# Patient Record
Sex: Female | Born: 1985
Health system: Southern US, Community
[De-identification: ages and names within clinical notes are randomized; demographics above are authoritative.]

## PROBLEM LIST (undated history)

## (undated) ENCOUNTER — Inpatient Hospital Stay (HOSPITAL_COMMUNITY): Payer: Self-pay

## (undated) ENCOUNTER — Ambulatory Visit (HOSPITAL_COMMUNITY): Admission: EM | Payer: Medicaid Other | Source: Home / Self Care

## (undated) DIAGNOSIS — D649 Anemia, unspecified: Secondary | ICD-10-CM

## (undated) DIAGNOSIS — J339 Nasal polyp, unspecified: Secondary | ICD-10-CM

## (undated) DIAGNOSIS — T8859XA Other complications of anesthesia, initial encounter: Secondary | ICD-10-CM

## (undated) DIAGNOSIS — O24419 Gestational diabetes mellitus in pregnancy, unspecified control: Secondary | ICD-10-CM

## (undated) DIAGNOSIS — N90810 Female genital mutilation status, unspecified: Secondary | ICD-10-CM

## (undated) DIAGNOSIS — N841 Polyp of cervix uteri: Secondary | ICD-10-CM

## (undated) HISTORY — DX: Other complications of anesthesia, initial encounter: T88.59XA

---

## 2016-04-29 ENCOUNTER — Encounter: Payer: Self-pay | Admitting: Physician Assistant

## 2016-04-29 ENCOUNTER — Ambulatory Visit: Payer: Medicaid Other | Attending: Physician Assistant | Admitting: Physician Assistant

## 2016-04-29 VITALS — BP 102/72 | HR 84 | Temp 98.6°F | Ht 60.0 in | Wt 110.0 lb

## 2016-04-29 DIAGNOSIS — H6123 Impacted cerumen, bilateral: Secondary | ICD-10-CM

## 2016-04-29 DIAGNOSIS — R05 Cough: Secondary | ICD-10-CM | POA: Diagnosis not present

## 2016-04-29 DIAGNOSIS — R059 Cough, unspecified: Secondary | ICD-10-CM

## 2016-04-29 DIAGNOSIS — Z3201 Encounter for pregnancy test, result positive: Secondary | ICD-10-CM | POA: Insufficient documentation

## 2016-04-29 DIAGNOSIS — J029 Acute pharyngitis, unspecified: Secondary | ICD-10-CM | POA: Diagnosis not present

## 2016-04-29 DIAGNOSIS — Z3490 Encounter for supervision of normal pregnancy, unspecified, unspecified trimester: Secondary | ICD-10-CM

## 2016-04-29 DIAGNOSIS — Z79899 Other long term (current) drug therapy: Secondary | ICD-10-CM | POA: Diagnosis not present

## 2016-04-29 LAB — POCT URINE PREGNANCY: Preg Test, Ur: POSITIVE — AB

## 2016-04-29 MED ORDER — SALINE SPRAY 0.65 % NA SOLN: 1.0000 | NASAL | 0 refills | Status: DC | PRN

## 2016-04-29 MED ORDER — PHENYLEPHRINE HCL 1 % NA SOLN: 1.0000 [drp] | Freq: Four times a day (QID) | NASAL | 0 refills | Status: DC | PRN

## 2016-04-29 MED ORDER — BENZONATATE 100 MG PO CAPS: 100.0000 mg | ORAL_CAPSULE | Freq: Two times a day (BID) | ORAL | 0 refills | Status: DC | PRN

## 2016-04-29 MED ORDER — AZITHROMYCIN 250 MG PO TABS
ORAL_TABLET | ORAL | 0 refills | Status: DC
Start: 2016-04-29 — End: 2016-04-30

## 2016-04-29 MED ORDER — CARBAMIDE PEROXIDE 6.5 % OT SOLN: 5.0000 [drp] | Freq: Two times a day (BID) | OTIC | 0 refills | Status: DC

## 2016-04-29 NOTE — Patient Instructions (Addendum)
You likely have a viral illness but the chronicity of your symptoms is concerning and I would like for you to take an antibiotic for bacterial coverage. Please call us at [phone] if you have persistent symptoms or worsening symptoms.    Upper Respiratory Infection, Adult Most upper respiratory infections (URIs) are a viral infection of the air passages leading to the lungs. A URI affects the nose, throat, and upper air passages. The most common type of URI is nasopharyngitis and is typically referred to as "the common cold." URIs run their course and usually go away on their own. Most of the time, a URI does not require medical attention, but sometimes a bacterial infection in the upper airways can follow a viral infection. This is called a secondary infection. Sinus and middle ear infections are common types of secondary upper respiratory infections. Bacterial pneumonia can also complicate a URI. A URI can worsen asthma and chronic obstructive pulmonary disease (COPD). Sometimes, these complications can require emergency medical care and may be life threatening. What are the causes? Almost all URIs are caused by viruses. A virus is a type of germ and can spread from one person to another. What increases the risk? You may be at risk for a URI if:  You smoke.  You have chronic heart or lung disease.  You have a weakened defense (immune) system.  You are very young or very old.  You have nasal allergies or asthma.  You work in crowded or poorly ventilated areas.  You work in health care facilities or schools. What are the signs or symptoms? Symptoms typically develop 2-3 days after you come in contact with a cold virus. Most viral URIs last 7-10 days. However, viral URIs from the influenza virus (flu virus) can last 14-18 days and are typically more severe. Symptoms may include:  Runny or stuffy (congested) nose.  Sneezing.  Cough.  Sore  throat.  Headache.  Fatigue.  Fever.  Loss of appetite.  Pain in your forehead, behind your eyes, and over your cheekbones (sinus pain).  Muscle aches. How is this diagnosed? Your health care provider may diagnose a URI by:  Physical exam.  Tests to check that your symptoms are not due to another condition such as:  Strep throat.  Sinusitis.  Pneumonia.  Asthma. How is this treated? A URI goes away on its own with time. It cannot be cured with medicines, but medicines may be prescribed or recommended to relieve symptoms. Medicines may help:  Reduce your fever.  Reduce your cough.  Relieve nasal congestion. Follow these instructions at home:  Take medicines only as directed by your health care provider.  Gargle warm saltwater or take cough drops to comfort your throat as directed by your health care provider.  Use a warm mist humidifier or inhale steam from a shower to increase air moisture. This may make it easier to breathe.  Drink enough fluid to keep your urine clear or pale yellow.  Eat soups and other clear broths and maintain good nutrition.  Rest as needed.  Return to work when your temperature has returned to normal or as your health care provider advises. You may need to stay home longer to avoid infecting others. You can also use a face mask and careful hand washing to prevent spread of the virus.  Increase the usage of your inhaler if you have asthma.  Do not use any tobacco products, including cigarettes, chewing tobacco, or electronic cigarettes. If you need help  quitting, ask your health care provider. How is this prevented? The best way to protect yourself from getting a cold is to practice good hygiene.  Avoid oral or hand contact with people with cold symptoms.  Wash your hands often if contact occurs. There is no clear evidence that vitamin C, vitamin E, echinacea, or exercise reduces the chance of developing a cold. However, it is always  recommended to get plenty of rest, exercise, and practice good nutrition. Contact a health care provider if:  You are getting worse rather than better.  Your symptoms are not controlled by medicine.  You have chills.  You have worsening shortness of breath.  You have brown or red mucus.  You have yellow or brown nasal discharge.  You have pain in your face, especially when you bend forward.  You have a fever.  You have swollen neck glands.  You have pain while swallowing.  You have white areas in the back of your throat. Get help right away if:  You have severe or persistent:  Headache.  Ear pain.  Sinus pain.  Chest pain.  You have chronic lung disease and any of the following:  Wheezing.  Prolonged cough.  Coughing up blood.  A change in your usual mucus.  You have a stiff neck.  You have changes in your:  Vision.  Hearing.  Thinking.  Mood. This information is not intended to replace advice given to you by your health care provider. Make sure you discuss any questions you have with your health care provider. Document Released: 08/18/2000 Document Revised: 10/26/2015 Document Reviewed: 05/30/2013 Elsevier Interactive Patient Education  2017 [employer].

## 2016-04-29 NOTE — Progress Notes (Signed)
   Subjective:  Patient ID: Kelsey Ramirez, female    DOB: 04/10/1985  Age: 31 y.o. MRN: [id number]  CC:  Chief Complaint  Patient presents with  . Cough    HPI Kelsey Ramirez is a 31 y.o. female with no significant medical history that presents with 8 days of sore throat, mild left-sided ear pain, general malaise, nasal congestion, and cough. Husband translates for her. [place] and blames acclimates for her symptoms. Has not taken anything for relief. No close contacts with the same, although, husband states that he is beginning to feel a "tickle" in his throat to. Patient does not endorse any other symptoms. Does not believe she is pregnant.    ROS Review of Systems  Constitutional: Positive for malaise/fatigue. Negative for chills and fever.  HENT: Positive for congestion, ear pain (Left side) and sore throat. Negative for sinus pain.   Eyes: Negative for blurred vision, pain and redness.  Respiratory: Positive for cough. Negative for shortness of breath.   Cardiovascular: Negative for chest pain and palpitations.  Gastrointestinal: Negative for abdominal pain and nausea.  Genitourinary: Negative for dysuria and hematuria.  Musculoskeletal: Negative for joint pain and myalgias.  Skin: Negative for rash.  Neurological: Negative for tingling and headaches.  Psychiatric/Behavioral: Negative for depression. The patient is not nervous/anxious.     Objective:  BP 102/72 (BP Location: Left Arm, Patient Position: Sitting, Cuff Size: Normal)   Pulse 84   Temp 98.6 F (37 C) (Oral)   Ht 5' (1.524 m)   Wt 110 lb (49.9 kg)   SpO2 98%   BMI 21.48 kg/m   BP/Weight 04/29/2016  Systolic BP 102  Diastolic BP 72  Wt. (Lbs) 110  BMI 21.48      Physical Exam  Constitutional: She is oriented to person, place, and time.  Mildly ill, NAD, polite  HENT:  Head: Normocephalic and atraumatic.  TMs not visualized due to cerumen impaction. turbinates  erythematous and hypertrophic, oropharynx with mild postnasal drip and no exudates.   Eyes: Conjunctivae are normal. No scleral icterus.  Neck: Normal range of motion. Neck supple. No thyromegaly present.  Cardiovascular: Normal rate, regular rhythm and normal heart sounds.   Pulmonary/Chest: Effort normal. She has no wheezes. She has no rales.  Abdominal: Soft. Bowel sounds are normal.  Musculoskeletal: She exhibits no edema.  Lymphadenopathy:    She has cervical adenopathy (mild anterior cervical lymphadenopathy).  Neurological: She is alert and oriented to person, place, and time.  Skin: Skin is warm and dry. No rash noted.  Psychiatric: Her behavior is normal.     Assessment & Plan:   1. Sore throat - POCT Rapid Strep A Negative - Upper Respiratory Culture  2. Cough Likely viral but is at 8 days of illness with no relief. Pt not toxic or febrile but will treat with abx as a precaution. -Zpack  3. Bilateral impacted cerumen -advised to avoid use of q-tips into the ear canal. -Use Debrox as directed  4. High risk medication use -Possible safety concerns with use of prescribed medications during pregnancy. - POCT urine pregnancy Positive  5. Pregnancy - Positive urine HCG -referral to OB/GYN   Follow-up: Follow up as needed.  Loletta Specteroger David Malina Geers PA

## 2016-04-30 LAB — POCT RAPID STREP A (OFFICE): Rapid Strep A Screen: NEGATIVE

## 2016-04-30 MED ORDER — CARBAMIDE PEROXIDE 6.5 % OT SOLN: 5.0000 [drp] | Freq: Two times a day (BID) | OTIC | 0 refills | Status: DC

## 2016-04-30 MED ORDER — AZITHROMYCIN 250 MG PO TABS: ORAL_TABLET | ORAL | 0 refills | Status: AC

## 2016-04-30 MED FILL — EARWAX TREATMENT 6.5% DROPS: 6.5 | 30 days supply | Qty: 15 | Fill #0

## 2016-04-30 MED FILL — AZITHROMYCIN 250 MG TABLET: 250 | 5 days supply | Qty: 6 | Fill #0

## 2016-05-02 LAB — CULTURE, UPPER RESPIRATORY: ORGANISM ID, BACTERIA: NORMAL

## 2016-05-05 ENCOUNTER — Encounter: Payer: Self-pay | Admitting: Advanced Practice Midwife

## 2016-05-06 ENCOUNTER — Encounter: Payer: Self-pay | Admitting: *Deleted

## 2016-05-06 NOTE — Progress Notes (Signed)
2

## 2016-05-25 ENCOUNTER — Ambulatory Visit (INDEPENDENT_AMBULATORY_CARE_PROVIDER_SITE_OTHER): Payer: Medicaid Other | Admitting: Advanced Practice Midwife

## 2016-05-25 ENCOUNTER — Encounter: Payer: Self-pay | Admitting: Advanced Practice Midwife

## 2016-05-25 ENCOUNTER — Ambulatory Visit: Payer: Self-pay

## 2016-05-25 ENCOUNTER — Other Ambulatory Visit (HOSPITAL_COMMUNITY)
Admission: RE | Admit: 2016-05-25 | Discharge: 2016-05-25 | Disposition: A | Discharge status: Home / Self Care | Payer: Medicaid Other | Source: Ambulatory Visit | Attending: Advanced Practice Midwife | Admitting: Advanced Practice Midwife

## 2016-05-25 VITALS — BP 95/66 | HR 85 | Wt 110.8 lb

## 2016-05-25 DIAGNOSIS — Z113 Encounter for screening for infections with a predominantly sexual mode of transmission: Secondary | ICD-10-CM | POA: Diagnosis not present

## 2016-05-25 DIAGNOSIS — Z3480 Encounter for supervision of other normal pregnancy, unspecified trimester: Secondary | ICD-10-CM | POA: Diagnosis present

## 2016-05-25 DIAGNOSIS — O3680X0 Pregnancy with inconclusive fetal viability, not applicable or unspecified: Secondary | ICD-10-CM

## 2016-05-25 DIAGNOSIS — Z3689 Encounter for other specified antenatal screening: Secondary | ICD-10-CM | POA: Diagnosis not present

## 2016-05-25 DIAGNOSIS — Z3481 Encounter for supervision of other normal pregnancy, first trimester: Secondary | ICD-10-CM | POA: Diagnosis present

## 2016-05-25 DIAGNOSIS — Z124 Encounter for screening for malignant neoplasm of cervix: Secondary | ICD-10-CM

## 2016-05-25 DIAGNOSIS — O34219 Maternal care for unspecified type scar from previous cesarean delivery: Secondary | ICD-10-CM | POA: Diagnosis not present

## 2016-05-25 DIAGNOSIS — Z3490 Encounter for supervision of normal pregnancy, unspecified, unspecified trimester: Secondary | ICD-10-CM | POA: Insufficient documentation

## 2016-05-25 LAB — POCT URINALYSIS DIP (DEVICE)
BILIRUBIN URINE: NEGATIVE
Glucose, UA: NEGATIVE mg/dL
Hgb urine dipstick: NEGATIVE
KETONES UR: NEGATIVE mg/dL
Leukocytes, UA: NEGATIVE
Nitrite: NEGATIVE
Protein, ur: 30 mg/dL — AB
Specific Gravity, Urine: 1.025 (ref 1.005–1.030)
Urobilinogen, UA: 0.2 mg/dL (ref 0.0–1.0)
pH: 6 (ref 5.0–8.0)

## 2016-05-25 LAB — POCT PREGNANCY, URINE: PREG TEST UR: POSITIVE — AB

## 2016-05-25 NOTE — Progress Notes (Signed)
  Subjective:    Kelsey Ramirez is being seen today for her first obstetrical visit. She is married. She is at 2614w6d gestation by LMP. Her obstetrical history is significant for previous C/S (Elective), female circumcision. Patient does intend to breast feed. Pregnancy history fully reviewed.  Patient reports no complaints.  Hospital Interpreter used.  Review of Systems:   Review of Systems  Gastrointestinal: Negative for abdominal pain.  Genitourinary: Negative for vaginal bleeding and vaginal discharge.    Objective:     BP 95/66   Pulse 85   Wt 110 lb 12.8 oz (50.3 kg)   LMP 03/31/2016   BMI 21.64 kg/m  Physical Exam  Nursing note and vitals reviewed. Constitutional: She is oriented to person, place, and time. No distress.  Eyes: No scleral icterus.  Cardiovascular: Normal rate and regular rhythm.   Respiratory: Effort normal and breath sounds normal. No respiratory distress.  GI: Soft. There is no tenderness.  Genitourinary:  Genitourinary Comments: Refused  Musculoskeletal: Normal range of motion.  Neurological: She is alert and oriented to person, place, and time. She has normal reflexes.  Skin: Skin is warm and dry.  Psychiatric: She has a normal mood and affect.    Exam    Assessment:    Pregnancy: G2P1 1. Encounter to determine fetal viability of pregnancy, single or unspecified fetus  - US OB Limited; Future  2. Supervision of other normal pregnancy, antepartum  - US MFM Fetal Nuchal Translucency; Future - Obstetric Panel, Including HIV - Hemoglobinopathy Evaluation - Cervicovaginal ancillary only - Culture, OB Urine  3. Previous cesarean delivery, antepartum    Plan:     Initial labs drawn. Refused Pap or any pelvic exam. Discussed that pelvic exams may be necessary. Offered self collection of specimens when reasonable. Prenatal vitamins. Problem list reviewed and updated. AFP3 discussed: ordered. Role of ultrasound in pregnancy discussed;  fetal survey: requested. Amniocentesis discussed: not indicated. Follow up in 4 weeks. Undecided btw VBAC or repeat c/S. Discussed R/B/I of each.    Dorathy KinsmanVirginia Averey Trompeter 05/25/2016

## 2016-05-25 NOTE — Patient Instructions (Signed)
First Trimester of Pregnancy The first trimester of pregnancy is from week 1 until the end of week 13 (months 1 through 3). A week after a sperm fertilizes an egg, the egg will implant on the wall of the uterus. This embryo will begin to develop into a baby. Genes from you and your partner will form the baby. The female genes will determine whether the baby will be a boy or a girl. At 6-8 weeks, the eyes and face will be formed, and the heartbeat can be seen on ultrasound. At the end of 12 weeks, all the baby's organs will be formed. Now that you are pregnant, you will want to do everything you can to have a healthy baby. Two of the most important things are to get good prenatal care and to follow your health care provider's instructions. Prenatal care is all the medical care you receive before the baby's birth. This care will help prevent, find, and treat any problems during the pregnancy and childbirth. Body changes during your first trimester Your body goes through many changes during pregnancy. The changes vary from woman to woman.  You may gain or lose a couple of pounds at first.  You may feel sick to your stomach (nauseous) and you may throw up (vomit). If the vomiting is uncontrollable, call your health care provider.  You may tire easily.  You may develop headaches that can be relieved by medicines. All medicines should be approved by your health care provider.  You may urinate more often. Painful urination may mean you have a bladder infection.  You may develop heartburn as a result of your pregnancy.  You may develop constipation because certain hormones are causing the muscles that push stool through your intestines to slow down.  You may develop hemorrhoids or swollen veins (varicose veins).  Your breasts may begin to grow larger and become tender. Your nipples may stick out more, and the tissue that surrounds them (areola) may become darker.  Your gums may bleed and may be  sensitive to brushing and flossing.  Dark spots or blotches (chloasma, mask of pregnancy) may develop on your face. This will likely fade after the baby is born.  Your menstrual periods will stop.  You may have a loss of appetite.  You may develop cravings for certain kinds of food.  You may have changes in your emotions from day to day, such as being excited to be pregnant or being concerned that something may go wrong with the pregnancy and baby.  You may have more vivid and strange dreams.  You may have changes in your hair. These can include thickening of your hair, rapid growth, and changes in texture. Some women also have hair loss during or after pregnancy, or hair that feels dry or thin. Your hair will most likely return to normal after your baby is born.  What to expect at prenatal visits During a routine prenatal visit:  You will be weighed to make sure you and the baby are growing normally.  Your blood pressure will be taken.  Your abdomen will be measured to track your baby's growth.  The fetal heartbeat will be listened to between weeks 10 and 14 of your pregnancy.  Test results from any previous visits will be discussed.  Your health care provider may ask you:  How you are feeling.  If you are feeling the baby move.  If you have had any abnormal symptoms, such as leaking fluid, bleeding, severe headaches,   or abdominal cramping.  If you are using any tobacco products, including cigarettes, chewing tobacco, and electronic cigarettes.  If you have any questions.  Other tests that may be performed during your first trimester include:  Blood tests to find your blood type and to check for the presence of any previous infections. The tests will also be used to check for low iron levels (anemia) and protein on red blood cells (Rh antibodies). Depending on your risk factors, or if you previously had diabetes during pregnancy, you may have tests to check for high blood  sugar that affects pregnant women (gestational diabetes).  Urine tests to check for infections, diabetes, or protein in the urine.  An ultrasound to confirm the proper growth and development of the baby.  Fetal screens for spinal cord problems (spina bifida) and Down syndrome.  HIV (human immunodeficiency virus) testing. Routine prenatal testing includes screening for HIV, unless you choose not to have this test.  You may need other tests to make sure you and the baby are doing well.  Follow these instructions at home: Medicines  Follow your health care provider's instructions regarding medicine use. Specific medicines may be either safe or unsafe to take during pregnancy.  Take a prenatal vitamin that contains at least 600 micrograms (mcg) of folic acid.  If you develop constipation, try taking a stool softener if your health care provider approves. Eating and drinking  Eat a balanced diet that includes fresh fruits and vegetables, whole grains, good sources of protein such as meat, eggs, or tofu, and low-fat dairy. Your health care provider will help you determine the amount of weight gain that is right for you.  Avoid raw meat and uncooked cheese. These carry germs that can cause birth defects in the baby.  Eating four or five small meals rather than three large meals a day may help relieve nausea and vomiting. If you start to feel nauseous, eating a few soda crackers can be helpful. Drinking liquids between meals, instead of during meals, also seems to help ease nausea and vomiting.  Limit foods that are high in fat and processed sugars, such as fried and sweet foods.  To prevent constipation: ? Eat foods that are high in fiber, such as fresh fruits and vegetables, whole grains, and beans. ? Drink enough fluid to keep your urine clear or pale yellow. Activity  Exercise only as directed by your health care provider. Most women can continue their usual exercise routine during  pregnancy. Try to exercise for 30 minutes at least 5 days a week. Exercising will help you: ? Control your weight. ? Stay in shape. ? Be prepared for labor and delivery.  Experiencing pain or cramping in the lower abdomen or lower back is a good sign that you should stop exercising. Check with your health care provider before continuing with normal exercises.  Try to avoid standing for long periods of time. Move your legs often if you must stand in one place for a long time.  Avoid heavy lifting.  Wear low-heeled shoes and practice good posture.  You may continue to have sex unless your health care provider tells you not to. Relieving pain and discomfort  Wear a good support bra to relieve breast tenderness.  Take warm sitz baths to soothe any pain or discomfort caused by hemorrhoids. Use hemorrhoid cream if your health care provider approves.  Rest with your legs elevated if you have leg cramps or low back pain.  If you develop   varicose veins in your legs, wear support hose. Elevate your feet for 15 minutes, 3-4 times a day. Limit salt in your diet. Prenatal care  Schedule your prenatal visits by the twelfth week of pregnancy. They are usually scheduled monthly at first, then more often in the last 2 months before delivery.  Write down your questions. Take them to your prenatal visits.  Keep all your prenatal visits as told by your health care provider. This is important. Safety  Wear your seat belt at all times when driving.  Make a list of emergency phone numbers, including numbers for family, friends, the hospital, and police and fire departments. General instructions  Ask your health care provider for a referral to a local prenatal education class. Begin classes no later than the beginning of month 6 of your pregnancy.  Ask for help if you have counseling or nutritional needs during pregnancy. Your health care provider can offer advice or refer you to specialists for help  with various needs.  Do not use hot tubs, steam rooms, or saunas.  Do not douche or use tampons or scented sanitary pads.  Do not cross your legs for long periods of time.  Avoid cat litter boxes and soil used by cats. These carry germs that can cause birth defects in the baby and possibly loss of the fetus by miscarriage or stillbirth.  Avoid all smoking, herbs, alcohol, and medicines not prescribed by your health care provider. Chemicals in these products affect the formation and growth of the baby.  Do not use any products that contain nicotine or tobacco, such as cigarettes and e-cigarettes. If you need help quitting, ask your health care provider. You may receive counseling support and other resources to help you quit.  Schedule a dentist appointment. At home, brush your teeth with a soft toothbrush and be gentle when you floss. Contact a health care provider if:  You have dizziness.  You have mild pelvic cramps, pelvic pressure, or nagging pain in the abdominal area.  You have persistent nausea, vomiting, or diarrhea.  You have a bad smelling vaginal discharge.  You have pain when you urinate.  You notice increased swelling in your face, hands, legs, or ankles.  You are exposed to fifth disease or chickenpox.  You are exposed to German measles (rubella) and have never had it. Get help right away if:  You have a fever.  You are leaking fluid from your vagina.  You have spotting or bleeding from your vagina.  You have severe abdominal cramping or pain.  You have rapid weight gain or loss.  You vomit blood or material that looks like coffee grounds.  You develop a severe headache.  You have shortness of breath.  You have any kind of trauma, such as from a fall or a car accident. Summary  The first trimester of pregnancy is from week 1 until the end of week 13 (months 1 through 3).  Your body goes through many changes during pregnancy. The changes vary from  woman to woman.  You will have routine prenatal visits. During those visits, your health care provider will examine you, discuss any test results you may have, and talk with you about how you are feeling. This information is not intended to replace advice given to you by your health care provider. Make sure you discuss any questions you have with your health care provider. Document Released: 02/16/2001 Document Revised: 02/04/2016 Document Reviewed: 02/04/2016 Elsevier Interactive Patient Education  2017 [employer]  

## 2016-05-25 NOTE — Progress Notes (Signed)
Interpreter present during US exam.  Pt informed that the ultrasound is considered a limited OB ultrasound and is not intended to be a complete ultrasound exam.  Patient also informed that the ultrasound is not being completed with the intent of assessing for fetal or placental anomalies or any pelvic abnormalities.  Explained that the purpose of today's ultrasound is to assess for viability.  Patient acknowledges the purpose of the exam and the limitations of the study.    Single IUP FHR - 166 bpm per M-mode

## 2016-05-26 LAB — OBSTETRIC PANEL, INCLUDING HIV
ANTIBODY SCREEN: NEGATIVE
BASOS: 0 %
Basophils Absolute: 0 10*3/uL (ref 0.0–0.2)
EOS (ABSOLUTE): 0.6 10*3/uL — ABNORMAL HIGH (ref 0.0–0.4)
EOS: 10 %
HEMATOCRIT: 36 % (ref 34.0–46.6)
HEMOGLOBIN: 11.8 g/dL (ref 11.1–15.9)
HIV SCREEN 4TH GENERATION: NONREACTIVE
Hepatitis B Surface Ag: NEGATIVE
IMMATURE GRANS (ABS): 0 10*3/uL (ref 0.0–0.1)
Immature Granulocytes: 0 %
Lymphocytes Absolute: 2.5 10*3/uL (ref 0.7–3.1)
Lymphs: 40 %
MCH: 27.7 pg (ref 26.6–33.0)
MCHC: 32.8 g/dL (ref 31.5–35.7)
MCV: 85 fL (ref 79–97)
MONOS ABS: 0.5 10*3/uL (ref 0.1–0.9)
Monocytes: 8 %
NEUTROS ABS: 2.6 10*3/uL (ref 1.4–7.0)
Neutrophils: 42 %
Platelets: 366 10*3/uL (ref 150–379)
RBC: 4.26 x10E6/uL (ref 3.77–5.28)
RDW: 15.1 % (ref 12.3–15.4)
RH TYPE: POSITIVE
RPR Ser Ql: NONREACTIVE
RUBELLA: 3.97 {index} (ref >0.99)
WBC: 6.2 10*3/uL (ref 3.4–10.8)

## 2016-05-26 LAB — HEMOGLOBINOPATHY EVALUATION
FERRITIN: 181 ng/mL — AB (ref 15–150)
HGB A2 QUANT: 2.5 % (ref 1.8–3.2)
HGB C: 0 %
Hgb A: 97.5 % (ref 96.4–98.8)
Hgb F Quant: 0 % (ref 0.0–2.0)
Hgb S: 0 %
Hgb Solubility: NEGATIVE
Hgb Variant: 0 %

## 2016-05-26 LAB — CERVICOVAGINAL ANCILLARY ONLY
Chlamydia: NEGATIVE
Neisseria Gonorrhea: NEGATIVE

## 2016-05-27 LAB — URINE CULTURE, OB REFLEX

## 2016-05-27 LAB — CULTURE, OB URINE

## 2016-06-23 ENCOUNTER — Ambulatory Visit (INDEPENDENT_AMBULATORY_CARE_PROVIDER_SITE_OTHER): Payer: Medicaid Other | Admitting: Family Medicine

## 2016-06-23 VITALS — BP 97/66 | HR 87 | Wt 108.1 lb

## 2016-06-23 DIAGNOSIS — Z3480 Encounter for supervision of other normal pregnancy, unspecified trimester: Secondary | ICD-10-CM

## 2016-06-23 DIAGNOSIS — N90810 Female genital mutilation status, unspecified: Secondary | ICD-10-CM

## 2016-06-23 DIAGNOSIS — Z789 Other specified health status: Secondary | ICD-10-CM

## 2016-06-23 DIAGNOSIS — O34219 Maternal care for unspecified type scar from previous cesarean delivery: Secondary | ICD-10-CM

## 2016-06-23 NOTE — Progress Notes (Signed)
Used [employer] interpreter [place], [id number]

## 2016-06-23 NOTE — Patient Instructions (Signed)
First Trimester of Pregnancy The first trimester of pregnancy is from week 1 until the end of week 13 (months 1 through 3). During this time, your baby will begin to develop inside you. At 6-8 weeks, the eyes and face are formed, and the heartbeat can be seen on ultrasound. At the end of 12 weeks, all the baby's organs are formed. Prenatal care is all the medical care you receive before the birth of your baby. Make sure you get good prenatal care and follow all of your doctor's instructions. Follow these instructions at home: Medicines  Take over-the-counter and prescription medicines only as told by your doctor. Some medicines are safe and some medicines are not safe during pregnancy.  Take a prenatal vitamin that contains at least 600 micrograms (mcg) of folic acid.  If you have trouble pooping (constipation), take medicine that will make your stool soft (stool softener) if your doctor approves. Eating and drinking  Eat regular, healthy meals.  Your doctor will tell you the amount of weight gain that is right for you.  Avoid raw meat and uncooked cheese.  If you feel sick to your stomach (nauseous) or throw up (vomit): ? Eat 4 or 5 small meals a day instead of 3 large meals. ? Try eating a few soda crackers. ? Drink liquids between meals instead of during meals.  To prevent constipation: ? Eat foods that are high in fiber, like fresh fruits and vegetables, whole grains, and beans. ? Drink enough fluids to keep your pee (urine) clear or pale yellow. Activity  Exercise only as told by your doctor. Stop exercising if you have cramps or pain in your lower belly (abdomen) or low back.  Do not exercise if it is too hot, too humid, or if you are in a place of great height (high altitude).  Try to avoid standing for long periods of time. Move your legs often if you must stand in one place for a long time.  Avoid heavy lifting.  Wear low-heeled shoes. Sit and stand up straight.  You  can have sex unless your doctor tells you not to. Relieving pain and discomfort  Wear a good support bra if your breasts are sore.  Take warm water baths (sitz baths) to soothe pain or discomfort caused by hemorrhoids. Use hemorrhoid cream if your doctor says it is okay.  Rest with your legs raised if you have leg cramps or low back pain.  If you have puffy, bulging veins (varicose veins) in your legs: ? Wear support hose or compression stockings as told by your doctor. ? Raise (elevate) your feet for 15 minutes, 3-4 times a day. ? Limit salt in your food. Prenatal care  Schedule your prenatal visits by the twelfth week of pregnancy.  Write down your questions. Take them to your prenatal visits.  Keep all your prenatal visits as told by your doctor. This is important. Safety  Wear your seat belt at all times when driving.  Make a list of emergency phone numbers. The list should include numbers for family, friends, the hospital, and police and fire departments. General instructions  Ask your doctor for a referral to a local prenatal class. Begin classes no later than at the start of month 6 of your pregnancy.  Ask for help if you need counseling or if you need help with nutrition. Your doctor can give you advice or tell you where to go for help.  Do not use hot tubs, steam rooms, or   saunas.  Do not douche or use tampons or scented sanitary pads.  Do not cross your legs for long periods of time.  Avoid all herbs and alcohol. Avoid drugs that are not approved by your doctor.  Do not use any tobacco products, including cigarettes, chewing tobacco, and electronic cigarettes. If you need help quitting, ask your doctor. You may get counseling or other support to help you quit.  Avoid cat litter boxes and soil used by cats. These carry germs that can cause birth defects in the baby and can cause a loss of your baby (miscarriage) or stillbirth.  Visit your dentist. At home, brush  your teeth with a soft toothbrush. Be gentle when you floss. Contact a doctor if:  You are dizzy.  You have mild cramps or pressure in your lower belly.  You have a nagging pain in your belly area.  You continue to feel sick to your stomach, you throw up, or you have watery poop (diarrhea).  You have a bad smelling fluid coming from your vagina.  You have pain when you pee (urinate).  You have increased puffiness (swelling) in your face, hands, legs, or ankles. Get help right away if:  You have a fever.  You are leaking fluid from your vagina.  You have spotting or bleeding from your vagina.  You have very bad belly cramping or pain.  You gain or lose weight rapidly.  You throw up blood. It may look like coffee grounds.  You are around people who have German measles, fifth disease, or chickenpox.  You have a very bad headache.  You have shortness of breath.  You have any kind of trauma, such as from a fall or a car accident. Summary  The first trimester of pregnancy is from week 1 until the end of week 13 (months 1 through 3).  To take care of yourself and your unborn baby, you will need to eat healthy meals, take medicines only if your doctor tells you to do so, and do activities that are safe for you and your baby.  Keep all follow-up visits as told by your doctor. This is important as your doctor will have to ensure that your baby is healthy and growing well. This information is not intended to replace advice given to you by your health care provider. Make sure you discuss any questions you have with your health care provider. Document Released: 08/11/2007 Document Revised: 03/02/2016 Document Reviewed: 03/02/2016 Elsevier Interactive Patient Education  2017 Elsevier Inc.  

## 2016-06-23 NOTE — Progress Notes (Signed)
      Subjective:  Kelsey Ramirez is a 31 y.o. G2P1001 at [redacted]w[redacted]d being seen today for ongoing prenatal care.  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  Patient reports no complaints.  Contractions: Not present. Vag. Bleeding: None.  Movement: Absent. Denies leaking of fluid.   The following portions of the patient's history were reviewed and updated as appropriate: allergies, current medications, past family history, past medical history, past social history, past surgical history and problem list. Problem list updated.  Objective:   Vitals:   06/23/16 1312  BP: 97/66  Pulse: 87  Weight: 108 lb 1.6 oz (49 kg)    Fetal Status: Fetal Heart Rate (bpm): 165   Movement: Absent      General:  Alert, oriented and cooperative. Patient is in no acute distress.  Skin: Skin is warm and dry. No rash noted.   Cardiovascular: Normal heart rate noted  Respiratory: Normal respiratory effort, no problems with respiration noted  Abdomen: Soft, gravid, appropriate for gestational age. Pain/Pressure: Absent     Pelvic:  Cervical exam deferred        Extremities: Normal range of motion.  Edema: None  Mental Status: Normal mood and affect. Normal behavior. Normal judgment and thought content.   Urinalysis:      Assessment and Plan:  Pregnancy: G2P1001 at [redacted]w[redacted]d  1. Supervision of other normal pregnancy, antepartum  2. Previous cesarean delivery, antepartum - Wants to TOLAC  3. Language barrier affecting health care - Arabic  4. Female circumcision - Very nervous about having any sort of check from below. Discussed with patient what cervical exams would entail and also the speculum. Patient is a little more open to having them done in the future. She is concerned due to her female  circumcision has a very small and tight opening and afraid it will hurt. - She is OK after delivery if female circ is torn, does not expect to repair to previous circ (OK if ends up "normal", and would prefer)   Preterm labor symptoms and general obstetric precautions including but not limited to vaginal bleeding, contractions, leaking of fluid and fetal movement were reviewed in detail with the patient. Please refer to After Visit Summary for other counseling recommendations.  Return in about 4 weeks (around 07/21/2016) for Routine OB visit; female provider only.   Jen Mow, DO OB Fellow [facility]

## 2016-07-02 ENCOUNTER — Ambulatory Visit (HOSPITAL_COMMUNITY)
Admission: RE | Admit: 2016-07-02 | Discharge: 2016-07-02 | Disposition: A | Discharge status: Home / Self Care | Payer: Medicaid Other | Source: Ambulatory Visit | Attending: Advanced Practice Midwife | Admitting: Advanced Practice Midwife

## 2016-07-02 ENCOUNTER — Other Ambulatory Visit: Payer: Self-pay | Admitting: Advanced Practice Midwife

## 2016-07-02 ENCOUNTER — Encounter (HOSPITAL_COMMUNITY): Payer: Self-pay

## 2016-07-02 DIAGNOSIS — Z3480 Encounter for supervision of other normal pregnancy, unspecified trimester: Secondary | ICD-10-CM

## 2016-07-02 DIAGNOSIS — Z3A13 13 weeks gestation of pregnancy: Secondary | ICD-10-CM | POA: Diagnosis not present

## 2016-07-02 DIAGNOSIS — Z3682 Encounter for antenatal screening for nuchal translucency: Secondary | ICD-10-CM | POA: Insufficient documentation

## 2016-07-09 ENCOUNTER — Other Ambulatory Visit: Payer: Self-pay

## 2016-07-21 ENCOUNTER — Ambulatory Visit (INDEPENDENT_AMBULATORY_CARE_PROVIDER_SITE_OTHER): Payer: Medicaid Other | Admitting: Medical

## 2016-07-21 VITALS — BP 96/57 | HR 90 | Wt 108.5 lb

## 2016-07-21 DIAGNOSIS — Z3480 Encounter for supervision of other normal pregnancy, unspecified trimester: Secondary | ICD-10-CM

## 2016-07-21 DIAGNOSIS — Z3482 Encounter for supervision of other normal pregnancy, second trimester: Secondary | ICD-10-CM

## 2016-07-21 NOTE — Progress Notes (Signed)
   PRENATAL VISIT NOTE  Subjective:  Kelsey Ramirez is a 31 y.o. G2P1001 at 4265w0d being seen today for ongoing prenatal care.  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  Patient reports no complaints.  Contractions: Not present. Vag. Bleeding: None.  Movement: Absent. Denies leaking of fluid.   The following portions of the patient's history were reviewed and updated as appropriate: allergies, current medications, past family history, past medical history, past social history, past surgical history and problem list. Problem list updated.  Objective:   Vitals:   07/21/16 1543  BP: (!) 96/57  Pulse: 90  Weight: 108 lb 8 oz (49.2 kg)    Fetal Status: Fetal Heart Rate (bpm): 152   Movement: Absent     General:  Alert, oriented and cooperative. Patient is in no acute distress.  Skin: Skin is warm and dry. No rash noted.   Cardiovascular: Normal heart rate noted  Respiratory: Normal respiratory effort, no problems with respiration noted  Abdomen: Soft, gravid, appropriate for gestational age. Pain/Pressure: Absent     Pelvic:  Cervical exam deferred        Extremities: Normal range of motion.     Mental Status: Normal mood and affect. Normal behavior. Normal judgment and thought content.   Assessment and Plan:  Pregnancy: G2P1001 at 165w0d  1. 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 Please refer to After Visit  Summary for other counseling recommendations.  Return in about 4 weeks (around 08/18/2016) for LOB.   Vonzella NippleJulie Wenzel, PA-C

## 2016-07-21 NOTE — Patient Instructions (Signed)
Second Trimester of Pregnancy The second trimester is from week 13 through week 28, month 4 through 6. This is often the time in pregnancy that you feel your best. Often times, morning sickness has lessened or quit. You may have more energy, and you may get hungry more often. Your unborn baby (fetus) is growing rapidly. At the end of the sixth month, he or she is about 9 inches long and weighs about 1 pounds. You will likely feel the baby move (quickening) between 18 and 20 weeks of pregnancy. Follow these instructions at home:  Avoid all smoking, herbs, and alcohol. Avoid drugs not approved by your doctor.  Do not use any tobacco products, including cigarettes, chewing tobacco, and electronic cigarettes. If you need help quitting, ask your doctor. You may get counseling or other support to help you quit.  Only take medicine as told by your doctor. Some medicines are safe and some are not during pregnancy.  Exercise only as told by your doctor. Stop exercising if you start having cramps.  Eat regular, healthy meals.  Wear a good support bra if your breasts are tender.  Do not use hot tubs, steam rooms, or saunas.  Wear your seat belt when driving.  Avoid raw meat, uncooked cheese, and liter boxes and soil used by cats.  Take your prenatal vitamins.  Take 1500-2000 milligrams of calcium daily starting at the 20th week of pregnancy until you deliver your baby.  Try taking medicine that helps you poop (stool softener) as needed, and if your doctor approves. Eat more fiber by eating fresh fruit, vegetables, and whole grains. Drink enough fluids to keep your pee (urine) clear or pale yellow.  Take warm water baths (sitz baths) to soothe pain or discomfort caused by hemorrhoids. Use hemorrhoid cream if your doctor approves.  If you have puffy, bulging veins (varicose veins), wear support hose. Raise (elevate) your feet for 15 minutes, 3-4 times a day. Limit salt in your diet.  Avoid heavy  lifting, wear low heals, and sit up straight.  Rest with your legs raised if you have leg cramps or low back pain.  Visit your dentist if you have not gone during your pregnancy. Use a soft toothbrush to brush your teeth. Be gentle when you floss.  You can have sex (intercourse) unless your doctor tells you not to.  Go to your doctor visits. Get help if:  You feel dizzy.  You have mild cramps or pressure in your lower belly (abdomen).  You have a nagging pain in your belly area.  You continue to feel sick to your stomach (nauseous), throw up (vomit), or have watery poop (diarrhea).  You have bad smelling fluid coming from your vagina.  You have pain with peeing (urination). Get help right away if:  You have a fever.  You are leaking fluid from your vagina.  You have spotting or bleeding from your vagina.  You have severe belly cramping or pain.  You lose or gain weight rapidly.  You have trouble catching your breath and have chest pain.  You notice sudden or extreme puffiness (swelling) of your face, hands, ankles, feet, or legs.  You have not felt the baby move in over an hour.  You have severe headaches that do not go away with medicine.  You have vision changes. This information is not intended to replace advice given to you by your health care provider. Make sure you discuss any questions you have with your health care   provider. Document Released: 05/19/2009 Document Revised: 07/31/2015 Document Reviewed: 04/25/2012 Elsevier Interactive Patient Education  2017 Elsevier Inc.  

## 2016-07-21 NOTE — Progress Notes (Signed)
Flu shot not given due to allergic to eggs with rash, itching.

## 2016-08-11 ENCOUNTER — Ambulatory Visit (HOSPITAL_COMMUNITY)
Admission: RE | Admit: 2016-08-11 | Discharge: 2016-08-11 | Disposition: A | Discharge status: Home / Self Care | Payer: Medicaid Other | Source: Ambulatory Visit | Attending: Medical | Admitting: Medical

## 2016-08-11 DIAGNOSIS — Z3482 Encounter for supervision of other normal pregnancy, second trimester: Secondary | ICD-10-CM | POA: Insufficient documentation

## 2016-08-11 DIAGNOSIS — Z3480 Encounter for supervision of other normal pregnancy, unspecified trimester: Secondary | ICD-10-CM | POA: Diagnosis present

## 2016-08-11 DIAGNOSIS — Z3A19 19 weeks gestation of pregnancy: Secondary | ICD-10-CM | POA: Insufficient documentation

## 2016-08-18 ENCOUNTER — Ambulatory Visit (INDEPENDENT_AMBULATORY_CARE_PROVIDER_SITE_OTHER): Payer: Medicaid Other | Admitting: Advanced Practice Midwife

## 2016-08-18 DIAGNOSIS — Z3480 Encounter for supervision of other normal pregnancy, unspecified trimester: Secondary | ICD-10-CM

## 2016-08-18 DIAGNOSIS — Z3482 Encounter for supervision of other normal pregnancy, second trimester: Secondary | ICD-10-CM

## 2016-08-18 MED ORDER — PRENATAL VITAMINS 0.8 MG PO TABS: 1.0000 | ORAL_TABLET | Freq: Every day | ORAL | 12 refills | Status: DC

## 2016-08-18 NOTE — Patient Instructions (Signed)

## 2016-08-19 ENCOUNTER — Encounter: Payer: Self-pay | Admitting: Advanced Practice Midwife

## 2016-08-19 NOTE — Progress Notes (Signed)
   PRENATAL VISIT NOTE  Subjective:  Kelsey Ramirez is a 31 y.o. G2P1001 at 5571w1d being seen today for ongoing prenatal care.  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  Patient reports no complaints.  Contractions: Not present.  .  Movement: Present. Denies leaking of fluid.   The following portions of the patient's history were reviewed and updated as appropriate: allergies, current medications, past family history, past medical history, past social history, past surgical history and problem list. Problem list updated.  Objective:   Vitals:   08/18/16 1307  BP: 100/67  Pulse: 76  Weight: 115 lb 3.2 oz (52.3 kg)    Fetal Status: Fetal Heart Rate (bpm): 156   Movement: Present     General:  Alert, oriented and cooperative. Patient is in no acute distress.  Skin: Skin is warm and dry. No rash noted.   Cardiovascular: Normal heart rate noted  Respiratory: Normal respiratory effort, no problems with respiration noted  Abdomen: Soft, gravid, appropriate for gestational age. Pain/Pressure: Absent     Pelvic:  Cervical exam deferred        Extremities: Normal range of motion.     Mental Status: Normal mood and affect. Normal behavior. Normal judgment and thought content.   Assessment and Plan:  Pregnancy: G2P1001 at 1571w1d  1. Supervision of other normal pregnancy, antepartum     Needs AFP only, left before getting it ordered.  WIll call her to come in for lab visit  Preterm labor symptoms and general obstetric precautions including but not limited to vaginal bleeding, contractions, leaking of fluid and fetal movement were reviewed in detail with the patient. Please refer to After Visit Summary for other counseling recommendations.  Return in about 4 weeks (around 09/15/2016)  for [facility].   Wynelle BourgeoisMarie Zurich Carreno, CNM

## 2016-08-23 ENCOUNTER — Telehealth: Payer: Self-pay | Admitting: *Deleted

## 2016-08-23 NOTE — Telephone Encounter (Signed)
Per message from Wynelle BourgeoisMarie Williams, CNM patient had first screen, needs appt for afp only. I called with pacific interperer [phone] and left message we are calling with some information- please call our office.

## 2016-08-25 NOTE — Telephone Encounter (Signed)
I have left a detailed message using arabic interpreter to call us back to scheduled an appointment for afp

## 2016-09-01 ENCOUNTER — Encounter: Payer: Self-pay | Admitting: *Deleted

## 2016-09-01 NOTE — Telephone Encounter (Signed)
[facility] [phone] , left a message we are calling with some information - please call us back . Will send letter. Needs to be drawn by or on 09/08/16.

## 2016-09-02 ENCOUNTER — Inpatient Hospital Stay (HOSPITAL_COMMUNITY)
Admission: AD | Admit: 2016-09-02 | Discharge: 2016-09-02 | Disposition: A | Discharge status: Home / Self Care | Payer: Medicaid Other | Source: Ambulatory Visit | Attending: Obstetrics and Gynecology | Admitting: Obstetrics and Gynecology

## 2016-09-02 DIAGNOSIS — Z3A22 22 weeks gestation of pregnancy: Secondary | ICD-10-CM | POA: Diagnosis not present

## 2016-09-02 DIAGNOSIS — O4692 Antepartum hemorrhage, unspecified, second trimester: Secondary | ICD-10-CM | POA: Diagnosis not present

## 2016-09-02 LAB — URINALYSIS, ROUTINE W REFLEX MICROSCOPIC
Bilirubin Urine: NEGATIVE
GLUCOSE, UA: NEGATIVE mg/dL
Hgb urine dipstick: NEGATIVE
Ketones, ur: NEGATIVE mg/dL
LEUKOCYTES UA: NEGATIVE
Nitrite: NEGATIVE
PROTEIN: NEGATIVE mg/dL
SPECIFIC GRAVITY, URINE: 1.008 (ref 1.005–1.030)
pH: 6 (ref 5.0–8.0)

## 2016-09-02 NOTE — MAU Note (Signed)
[facility] interpreter: #[id number] -   I fell down, I just want to check the heart beat.I did not fall on the floor, I lifted a heavy blanket. This happened around 2200 last night. I only want you to check my baby's heart beat.

## 2016-09-02 NOTE — MAU Provider Note (Signed)
Chief Complaint: Vaginal Bleeding   None     SUBJECTIVE HPI: Kelsey Ramirez is a 31 y.o. G2P1001 at 107w1d by LMP who presents to maternity admissions reporting vaginal spotting starting today, becoming less and less since onset but she came to be evaluated.  She reports she lifted something heavy at home today and then her 32 year old started to fall and she quickly reached for him and nearly fell. The spotting started after these events. She denies any associated symptoms. She declines a pelvic exam today but wants to hear the baby's heartbeat.   She denies abdominal pain, vaginal itching/burning, urinary symptoms, h/a, dizziness, n/v, or fever/chills.     HPI  No past medical history on file. Past Surgical History:  Procedure Laterality Date  . CESAREAN SECTION     Social History   Social History  . Marital status: Married    Spouse name: N/A  . Number of children: N/A  . Years of education: N/A   Occupational History  . Not on file.   Social History Main Topics  . Smoking status: Never Smoker  . Smokeless tobacco: Never Used  . Alcohol use No  . Drug use: No  . Sexual activity: Yes    Birth control/ protection: None   Other Topics Concern  . Not on file   Social History Narrative  . No narrative on file   No current facility-administered medications on file prior to encounter.    Current Outpatient Prescriptions on File Prior to Encounter  Medication Sig Dispense Refill  . folic acid (FOLVITE) 1 MG tablet Take 1 mg by mouth daily.    . Prenatal Multivit-Min-Fe-FA (PRENATAL VITAMINS) 0.8 MG tablet Take 1 tablet by mouth daily. 30 tablet 12  . sodium chloride (OCEAN) 0.65 % SOLN nasal spray Place 1 spray into both nostrils as needed for congestion. 1 Bottle 0   Allergies  Allergen Reactions  . Banana (Diagnostic) Itching  . Eggs Or Egg-Derived Products Itching  . Strawberry (Diagnostic) Itching    ROS:  Review of Systems  Constitutional: Negative for chills,  fatigue and fever.  Respiratory: Negative for shortness of breath.   Cardiovascular: Negative for chest pain.  Gastrointestinal: Negative for nausea and vomiting.  Genitourinary: Positive for vaginal bleeding. Negative for difficulty urinating, dysuria, flank pain, pelvic pain, vaginal discharge and vaginal pain.  Neurological: Negative for dizziness and headaches.  Psychiatric/Behavioral: Negative.      I have reviewed patient's Past Medical Hx, Surgical Hx, Family Hx, Social Hx, medications and allergies.   Physical Exam  Patient Vitals for the past 24 hrs:  BP Temp Temp src Pulse Resp SpO2 Height Weight  09/02/16 0206 102/65 98 F (36.7 C) Oral 92 18 100 % 5' (1.524 m) 118 lb (53.5 kg)   Constitutional: Well-developed, well-nourished female in no acute distress.  Cardiovascular: normal rate Respiratory: normal effort GI: Abd soft, non-tender. Pos BS x 4 MS: Extremities nontender, no edema, normal ROM Neurologic: Alert and oriented x 4.  GU: Neg CVAT.  PELVIC EXAM: Pt declines  FHT 138 by doppler  LAB RESULTS Results for orders placed or performed during the hospital encounter of 09/02/16 (from the past 24 hour(s))  Urinalysis, Routine w reflex microscopic     Status: Abnormal   Collection Time: 09/02/16  1:50 AM  Result Value Ref Range   Color, Urine YELLOW YELLOW   APPearance HAZY (A) CLEAR   Specific Gravity, Urine 1.008 1.005 - 1.030   pH 6.0 5.0 -  8.0   Glucose, UA NEGATIVE NEGATIVE mg/dL   Hgb urine dipstick NEGATIVE NEGATIVE   Bilirubin Urine NEGATIVE NEGATIVE   Ketones, ur NEGATIVE NEGATIVE mg/dL   Protein, ur NEGATIVE NEGATIVE mg/dL   Nitrite NEGATIVE NEGATIVE   Leukocytes, UA NEGATIVE NEGATIVE    O/Positive/-- (03/20 1139)  IMAGING   MAU Management/MDM: Pt declines pelvic exam.  Discussed with pt using Arabic on video interpreter that pelvic exam, at least cervical exam is recommended for bleeding at this gestation. Discussed causes of vaginal  bleeding including cervical shortening or preterm labor as reasons to perform exam. Pt and husband considered the option of having a pelvic exam but decided to decline. The bleeding has improved/mostly resolved and there is no pain so she will return to MAU if bleeding increases or she has pain. Precautions reviewed.  Pt reassured by normal FHT today.  F/U in office as scheduled, return to MAU as needed for emergencies.   Pt stable at time of discharge.  ASSESSMENT 1. Vaginal bleeding in pregnancy, second trimester     PLAN Discharge home with bleeding and preterm labor precautions  Allergies as of 09/02/2016      Reactions   Banana (diagnostic) Itching   Eggs Or Egg-derived Products Itching   Strawberry (diagnostic) Itching      Medication List    TAKE these medications   folic acid 1 MG tablet Commonly known as:  FOLVITE Take 1 mg by mouth daily.   Prenatal Vitamins 0.8 MG tablet Take 1 tablet by mouth daily.   sodium chloride 0.65 % Soln nasal spray Commonly known as:  OCEAN Place 1 spray into both nostrils as needed for congestion.      Follow-up Information    [facility]   Specialty:  Obstetrics and Gynecology Why:  As scheduled, return to MAU as needed for emergencies Contact information: [address] [place] [id number] [phone]          Sharen CounterLisa Leftwich-Kirby Certified Nurse-Midwife 09/02/2016  2:52 AM

## 2016-09-02 NOTE — MAU Note (Signed)
Pt here with c/o vaginal bleeding tonight, light bleeding; ran over to prevent baby from falling off couch, starting having some bleeding after that.

## 2016-09-02 NOTE — MAU Note (Addendum)
Adil [id number] [facility]) [facility].  Pt. Only wants fetal heart beat checked.  FHR via doppler 138 bpm. Pt. Stated she does not want U/S or anything. Provider discussed what procedures are indicated for a complaint of vaginal bleeding. Patient and her spouse declined - spouse shaking his head and saying "NO" vaginal exam. No vaginal exam performed. Pt. & spouse states, they will come back if bleeding starts again.

## 2016-09-10 ENCOUNTER — Other Ambulatory Visit: Payer: Medicaid Other

## 2016-09-10 DIAGNOSIS — Z1379 Encounter for other screening for genetic and chromosomal anomalies: Secondary | ICD-10-CM

## 2016-09-10 DIAGNOSIS — O34219 Maternal care for unspecified type scar from previous cesarean delivery: Secondary | ICD-10-CM

## 2016-09-15 LAB — AFP, SERUM, OPEN SPINA BIFIDA
AFP MoM: 1.61
AFP Value: 140.7 ng/mL
GEST. AGE ON COLLECTION DATE: 23.3 wk
Maternal Age At EDD: 32.8 yr
OSBR Risk 1 IN: 2047
TEST RESULTS AFP: NEGATIVE
Weight: 118 [lb_av]

## 2016-09-16 ENCOUNTER — Ambulatory Visit (INDEPENDENT_AMBULATORY_CARE_PROVIDER_SITE_OTHER): Payer: Medicaid Other | Admitting: Medical

## 2016-09-16 VITALS — BP 100/66 | HR 96 | Wt 118.3 lb

## 2016-09-16 DIAGNOSIS — Z3480 Encounter for supervision of other normal pregnancy, unspecified trimester: Secondary | ICD-10-CM

## 2016-09-16 DIAGNOSIS — Z3482 Encounter for supervision of other normal pregnancy, second trimester: Secondary | ICD-10-CM

## 2016-09-16 NOTE — Progress Notes (Signed)
Video Interpreter # [phone]

## 2016-09-16 NOTE — Progress Notes (Signed)
   PRENATAL VISIT NOTE  Subjective:  Kelsey Ramirez is a 31 y.o. G2P1001 at 6340w1d being seen today for ongoing prenatal care.  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  Patient reports no complaints.  Contractions: Not present. Vag. Bleeding: None.  Movement: Present. Denies leaking of fluid.   The following portions of the patient's history were reviewed and updated as appropriate: allergies, current medications, past family history, past medical history, past social history, past surgical history and problem list. Problem list updated.  Objective:   Vitals:   09/16/16 0756  BP: 100/66  Pulse: 96  Weight: 118 lb 4.8 oz (53.7 kg)    Fetal Status: Fetal Heart Rate (bpm): 165 Fundal Height: 24 cm Movement: Present     General:  Alert, oriented and cooperative. Patient is in no acute distress.  Skin: Skin is warm and dry. No rash noted.   Cardiovascular: Normal heart rate noted  Respiratory: Normal respiratory effort, no problems with respiration noted  Abdomen: Soft, gravid, appropriate for gestational age. Pain/Pressure: Absent     Pelvic:  Cervical exam deferred        Extremities: Normal range of motion.  Edema: None  Mental Status: Normal mood and affect. Normal behavior. Normal judgment and thought content.   Assessment and Plan:  Pregnancy: G2P1001 at 8040w1d  1. Supervision of other normal pregnancy, antepartum - Doing well [employer] letter supplied today  - Discussed need for fasting labs at the next visit  Preterm labor symptoms and general obstetric precautions including but not limited to vaginal bleeding, contractions, leaking of fluid and fetal movement were reviewed in detail with the patient. Please refer to After Visit Summary for other counseling  recommendations.  Return in about 4 weeks (around 10/14/2016) for LOB with fasting 2 hour GTT/labs.   Vonzella NippleJulie Dalaysia Harms, PA-C

## 2016-09-16 NOTE — Patient Instructions (Signed)
Second Trimester of Pregnancy The second trimester is from week 13 through week 28, month 4 through 6. This is often the time in pregnancy that you feel your best. Often times, morning sickness has lessened or quit. You may have more energy, and you may get hungry more often. Your unborn baby (fetus) is growing rapidly. At the end of the sixth month, he or she is about 9 inches long and weighs about 1 pounds. You will likely feel the baby move (quickening) between 18 and 20 weeks of pregnancy. Follow these instructions at home:  Avoid all smoking, herbs, and alcohol. Avoid drugs not approved by your doctor.  Do not use any tobacco products, including cigarettes, chewing tobacco, and electronic cigarettes. If you need help quitting, ask your doctor. You may get counseling or other support to help you quit.  Only take medicine as told by your doctor. Some medicines are safe and some are not during pregnancy.  Exercise only as told by your doctor. Stop exercising if you start having cramps.  Eat regular, healthy meals.  Wear a good support bra if your breasts are tender.  Do not use hot tubs, steam rooms, or saunas.  Wear your seat belt when driving.  Avoid raw meat, uncooked cheese, and liter boxes and soil used by cats.  Take your prenatal vitamins.  Take 1500-2000 milligrams of calcium daily starting at the 20th week of pregnancy until you deliver your baby.  Try taking medicine that helps you poop (stool softener) as needed, and if your doctor approves. Eat more fiber by eating fresh fruit, vegetables, and whole grains. Drink enough fluids to keep your pee (urine) clear or pale yellow.  Take warm water baths (sitz baths) to soothe pain or discomfort caused by hemorrhoids. Use hemorrhoid cream if your doctor approves.  If you have puffy, bulging veins (varicose veins), wear support hose. Raise (elevate) your feet for 15 minutes, 3-4 times a day. Limit salt in your diet.  Avoid heavy  lifting, wear low heals, and sit up straight.  Rest with your legs raised if you have leg cramps or low back pain.  Visit your dentist if you have not gone during your pregnancy. Use a soft toothbrush to brush your teeth. Be gentle when you floss.  You can have sex (intercourse) unless your doctor tells you not to.  Go to your doctor visits. Get help if:  You feel dizzy.  You have mild cramps or pressure in your lower belly (abdomen).  You have a nagging pain in your belly area.  You continue to feel sick to your stomach (nauseous), throw up (vomit), or have watery poop (diarrhea).  You have bad smelling fluid coming from your vagina.  You have pain with peeing (urination). Get help right away if:  You have a fever.  You are leaking fluid from your vagina.  You have spotting or bleeding from your vagina.  You have severe belly cramping or pain.  You lose or gain weight rapidly.  You have trouble catching your breath and have chest pain.  You notice sudden or extreme puffiness (swelling) of your face, hands, ankles, feet, or legs.  You have not felt the baby move in over an hour.  You have severe headaches that do not go away with medicine.  You have vision changes. This information is not intended to replace advice given to you by your health care provider. Make sure you discuss any questions you have with your health care   provider. Document Released: 05/19/2009 Document Revised: 07/31/2015 Document Reviewed: 04/25/2012 Elsevier Interactive Patient Education  2017 Elsevier Inc.  

## 2016-10-14 ENCOUNTER — Ambulatory Visit (INDEPENDENT_AMBULATORY_CARE_PROVIDER_SITE_OTHER): Payer: Medicaid Other | Admitting: Obstetrics and Gynecology

## 2016-10-14 ENCOUNTER — Encounter: Payer: Self-pay | Admitting: Obstetrics and Gynecology

## 2016-10-14 VITALS — BP 96/63 | HR 77

## 2016-10-14 DIAGNOSIS — Z3483 Encounter for supervision of other normal pregnancy, third trimester: Secondary | ICD-10-CM

## 2016-10-14 DIAGNOSIS — Z3480 Encounter for supervision of other normal pregnancy, unspecified trimester: Secondary | ICD-10-CM

## 2016-10-14 DIAGNOSIS — O34219 Maternal care for unspecified type scar from previous cesarean delivery: Secondary | ICD-10-CM

## 2016-10-14 DIAGNOSIS — Z789 Other specified health status: Secondary | ICD-10-CM

## 2016-10-14 MED ORDER — VITAFOL ULTRA 29-0.6-0.4-200 MG PO CAPS: 1.0000 | ORAL_CAPSULE | Freq: Every day | ORAL | 12 refills | Status: DC

## 2016-10-14 NOTE — Addendum Note (Signed)
Addended by: Kathee DeltonHILLMAN, Mercedez Boule L on: 10/14/2016 10:20 AM   Modules accepted: Orders

## 2016-10-14 NOTE — Progress Notes (Signed)
   PRENATAL VISIT NOTE  Subjective:  Kelsey Ramirez is a 31 y.o. G2P1001 at 7720w1d being seen today for ongoing prenatal care.  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  Patient reports no complaints.  Contractions: Not present. Vag. Bleeding: None.  Movement: Present. Denies leaking of fluid.   The following portions of the patient's history were reviewed and updated as appropriate: allergies, current medications, past family history, past medical history, past social history, past surgical history and problem list. Problem list updated.  Objective:   Vitals:   10/14/16 0815  BP: 96/63  Pulse: 77    Fetal Status: Fetal Heart Rate (bpm): 135 Fundal Height: 28 cm Movement: Present     General:  Alert, oriented and cooperative. Patient is in no acute distress.  Skin: Skin is warm and dry. No rash noted.   Cardiovascular: Normal heart rate noted  Respiratory: Normal respiratory effort, no problems with respiration noted  Abdomen: Soft, gravid, appropriate for gestational age.  Pain/Pressure: Absent     Pelvic: Cervical exam deferred        Extremities: Normal range of motion.  Edema: None  Mental Status:  Normal mood and affect. Normal behavior. Normal judgment and thought content.   Assessment and Plan:  Pregnancy: G2P1001 at 4420w1d  1. Encounter for supervision of other normal pregnancy in third trimester Patient is doing well without complaints Third trimester labs today Patient undecided on Tdap - Glucose Tolerance, 2 Hours w/1 Hour - RPR - CBC - HIV antibody (with reflex)  2. Language barrier affecting health care Arabic interpreter used during the encounter  3. Previous cesarean delivery, antepartum Patient remains undecided on TOLAC vs ERCS.  I spent 20  minutes with her explaining risks and benefits of both options. Patient remains undecided but understands that a c-section will have to be scheduled at 39 weeks and IOL will occur at 41 weeks pending no obstetrical complications Patient plans to provide an answer at her next visit  Preterm labor symptoms and general obstetric precautions including but not limited to vaginal bleeding, contractions, leaking of fluid and fetal movement were reviewed in detail with the patient. Please refer to After Visit Summary for other counseling recommendations.  Return in about 2 weeks (around 10/28/2016) for ROB.   Catalina AntiguaPeggy Maniya Donovan, MD

## 2016-10-15 LAB — CBC
HEMATOCRIT: 37.6 % (ref 34.0–46.6)
Hemoglobin: 12.6 g/dL (ref 11.1–15.9)
MCH: 27.9 pg (ref 26.6–33.0)
MCHC: 33.5 g/dL (ref 31.5–35.7)
MCV: 83 fL (ref 79–97)
PLATELETS: 294 10*3/uL (ref 150–379)
RBC: 4.52 x10E6/uL (ref 3.77–5.28)
RDW: 14.4 % (ref 12.3–15.4)
WBC: 9 10*3/uL (ref 3.4–10.8)

## 2016-10-15 LAB — HIV ANTIBODY (ROUTINE TESTING W REFLEX): HIV SCREEN 4TH GENERATION: NONREACTIVE

## 2016-10-15 LAB — GLUCOSE TOLERANCE, 2 HOURS W/ 1HR
GLUCOSE, 1 HOUR: 155 mg/dL (ref 65–179)
GLUCOSE, FASTING: 78 mg/dL (ref 65–91)
Glucose, 2 hour: 138 mg/dL (ref 65–152)

## 2016-10-15 LAB — RPR: RPR: NONREACTIVE

## 2016-10-28 ENCOUNTER — Ambulatory Visit (INDEPENDENT_AMBULATORY_CARE_PROVIDER_SITE_OTHER): Payer: Medicaid Other | Admitting: Obstetrics & Gynecology

## 2016-10-28 VITALS — BP 101/59 | HR 95 | Wt 120.0 lb

## 2016-10-28 DIAGNOSIS — Z23 Encounter for immunization: Secondary | ICD-10-CM

## 2016-10-28 DIAGNOSIS — F419 Anxiety disorder, unspecified: Secondary | ICD-10-CM

## 2016-10-28 DIAGNOSIS — Z3400 Encounter for supervision of normal first pregnancy, unspecified trimester: Secondary | ICD-10-CM

## 2016-10-28 DIAGNOSIS — O99340 Other mental disorders complicating pregnancy, unspecified trimester: Secondary | ICD-10-CM

## 2016-10-28 DIAGNOSIS — O9989 Other specified diseases and conditions complicating pregnancy, childbirth and the puerperium: Secondary | ICD-10-CM

## 2016-10-28 DIAGNOSIS — Z3403 Encounter for supervision of normal first pregnancy, third trimester: Secondary | ICD-10-CM

## 2016-10-28 MED ORDER — ACETAMINOPHEN 500 MG PO TABS: 500.0000 mg | ORAL_TABLET | Freq: Four times a day (QID) | ORAL | 0 refills | Status: DC | PRN

## 2016-10-28 MED ORDER — PREPLUS 27-1 MG PO TABS: 1.0000 | ORAL_TABLET | Freq: Every day | ORAL | 12 refills | Status: DC

## 2016-10-28 NOTE — Progress Notes (Signed)
   PRENATAL VISIT NOTE  Subjective:  Kelsey Ramirez is a 31 y.o. G2P1001 at [redacted]w[redacted]d being seen today for ongoing prenatal care.  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  Patient reports no complaints.  Contractions: Not present. Vag. Bleeding: None.  Movement: Present. Denies leaking of fluid.   The following portions of the patient's history were reviewed and updated as appropriate: allergies, current medications, past family history, past medical history, past social history, past surgical history and problem list. Problem list updated.  Objective:   Vitals:   10/28/16 1014  BP: (!) 101/59  Pulse: 95  Weight: 120 lb (54.4 kg)    Fetal Status: Fetal Heart Rate (bpm): 140   Movement: Present     General:  Alert, oriented and cooperative. Patient is in no acute distress.  Skin: Skin is warm and dry. No rash noted.   Cardiovascular: Normal heart rate noted  Respiratory: Normal respiratory effort, no problems with respiration noted  Abdomen: Soft, gravid, appropriate for gestational age.  Pain/Pressure: Absent     Pelvic: Cervical exam deferred        Extremities: Normal range of motion.  Edema: None  Mental Status:  Normal mood and affect. Normal behavior. Normal judgment and thought content.   Assessment and Plan:  Pregnancy: G2P1001 at [redacted]w[redacted]d  1. Supervision of normal first pregnancy, antepartum Agrees to tdap.  No eggs in tdap  (used interpreter--video interpreter used--[id number])  2.  Pt has extensive questions about epidural.  Wil refer to anethesia.  20 minutes spent this visit discussing.  Pt therefore unsure about vbac vs labor.  Extremely worried about any pain during labor.  Preterm labor symptoms and general obstetric precautions including but not limited to  vaginal bleeding, contractions, leaking of fluid and fetal movement were reviewed in detail with the patient. Please refer to After Visit Summary for other counseling recommendations.  Return in about 2 weeks (around 11/11/2016).   Elsie Lincoln, MD

## 2016-11-01 ENCOUNTER — Encounter: Payer: Self-pay | Admitting: *Deleted

## 2016-11-01 NOTE — Progress Notes (Signed)
Spoke with Dr Miguel Rota in anesthesia, need to schedule an anesthesia consult. He stated that after her appointment on 9/7 she should be walked up to preop, where the nurses will page the on call physician to come talk to her. Will annotate visit note and sticky note.

## 2016-11-12 ENCOUNTER — Encounter: Payer: Self-pay | Admitting: Obstetrics and Gynecology

## 2016-11-12 ENCOUNTER — Ambulatory Visit (INDEPENDENT_AMBULATORY_CARE_PROVIDER_SITE_OTHER): Payer: Medicaid Other | Admitting: Obstetrics and Gynecology

## 2016-11-12 ENCOUNTER — Encounter (HOSPITAL_COMMUNITY)
Admission: RE | Admit: 2016-11-12 | Discharge: 2016-11-12 | Disposition: A | Discharge status: Home / Self Care | Payer: Medicaid Other | Source: Ambulatory Visit | Attending: Anesthesiology | Admitting: Anesthesiology

## 2016-11-12 VITALS — BP 116/74 | HR 89 | Wt 124.2 lb

## 2016-11-12 DIAGNOSIS — Z3480 Encounter for supervision of other normal pregnancy, unspecified trimester: Secondary | ICD-10-CM

## 2016-11-12 DIAGNOSIS — Z789 Other specified health status: Secondary | ICD-10-CM

## 2016-11-12 DIAGNOSIS — O34219 Maternal care for unspecified type scar from previous cesarean delivery: Secondary | ICD-10-CM

## 2016-11-12 DIAGNOSIS — O09893 Supervision of other high risk pregnancies, third trimester: Secondary | ICD-10-CM | POA: Insufficient documentation

## 2016-11-12 DIAGNOSIS — N90810 Female genital mutilation status, unspecified: Secondary | ICD-10-CM

## 2016-11-12 DIAGNOSIS — Z603 Acculturation difficulty: Secondary | ICD-10-CM

## 2016-11-12 NOTE — Consult Note (Signed)
Asked to see pt in consult for SAB for repeat C-section.  Ms Kelsey Ramirez is an otherwise healthy 31 yo female, who is in anticipation of Caesarian section for delivery.  Her pregnancy is uncomplicated, and she has no significant medical history.  She is a non-smoker.  She had a Spinal anesthetic for her last C-section, and did well with this. She has several questions, mostly she is simply apprehensive because of her language barrier.  She requests a translator to be present at the time of delivery.    On PEx: Airway: MP 1, normal TM distance and neck mobility                  Lungs: Clear to ausc                  CV: normal S1, S2, no murmur  Risks/Benefits/Options discussed with patient including but not limited to bleeding, infection, nerve damage, paralysis, failed block, incomplete pain control, headache, blood pressure changes, nausea, vomiting, reactions to medication both or allergic, itching and postpartum back pain. Patient expressed understanding and is agreeable to Spinal anesthesia. All questions were answered.   Sandford Craze Hayes Czaja, MD

## 2016-11-12 NOTE — Progress Notes (Signed)
Patient here for anesthesia consult with Kelsey Jean Rosenthaljackson, md.  Interpreter present for conversation.

## 2016-11-12 NOTE — Progress Notes (Addendum)
Prenatal Visit Note Date: 11/12/2016 [facility]  Subjective:  Kelsey Ramirez is a 31 y.o. G2P1001 at 3218w2d being seen today for ongoing prenatal care.  She is currently monitored for the following issues for this high-risk pregnancy and has Supervision of normal pregnancy, antepartum; Previous cesarean delivery, antepartum; Language barrier affecting health care; Female circumcision; and Short interval between pregnancies affecting pregnancy in third trimester, antepartum on her problem list.  Patient reports no complaints.   Contractions: Not present. Vag. Bleeding: None.  Movement: Present. Denies leaking of fluid.   The following portions of the patient's history were reviewed and updated as appropriate: allergies, current medications, past family history, past medical history, past social history, past surgical history and problem list. Problem list updated.  Objective:   Vitals:   11/12/16 0916  BP: 116/74  Pulse: 89  Weight: 124 lb 3.2 oz (56.3 kg)    Fetal Status: Fetal Heart Rate (bpm): 135 Fundal Height: 32 cm Movement: Present     General:  Alert, oriented and cooperative. Patient is in no acute distress.  Skin: Skin is warm and dry. No rash noted.   Cardiovascular: Normal heart rate noted  Respiratory: Normal respiratory effort, no problems with respiration noted  Abdomen: Soft, gravid, appropriate for gestational age. Pain/Pressure: Absent     Pelvic:  Cervical exam deferred        Extremities: Normal range of motion.  Edema: None  Mental Status: Normal mood and affect. Normal behavior. Normal judgment and thought content.   Urinalysis:      Assessment and Plan:  Pregnancy: G2P1001 at 3218w2d  1. Language barrier affecting health care Interpreter used  2. Previous cesarean delivery, antepartum Pt would like rpt c-section with me. Request sent. [facility] Meets with anesthesia later today to go over any concerns with delivery.   3.  Supervision of other normal pregnancy, antepartum  4. Female circumcision  5. Short interval between pregnancies affecting pregnancy in third trimester, antepartum  Preterm labor symptoms and general obstetric precautions including but not limited to vaginal bleeding, contractions, leaking of fluid and fetal movement were reviewed in detail with the patient. Please refer to After Visit Summary for other counseling recommendations.  Return in about 2 weeks (around 11/26/2016) for rob with dr Vergie Livingpickens. [place].   Amen Dargis, MD

## 2016-11-15 ENCOUNTER — Encounter (HOSPITAL_COMMUNITY): Payer: Self-pay

## 2016-11-25 ENCOUNTER — Encounter: Payer: Self-pay | Admitting: Family Medicine

## 2016-11-25 ENCOUNTER — Ambulatory Visit (INDEPENDENT_AMBULATORY_CARE_PROVIDER_SITE_OTHER): Payer: Medicaid Other | Admitting: Family Medicine

## 2016-11-25 VITALS — BP 96/74 | HR 106 | Wt 124.0 lb

## 2016-11-25 DIAGNOSIS — O34219 Maternal care for unspecified type scar from previous cesarean delivery: Secondary | ICD-10-CM

## 2016-11-25 DIAGNOSIS — Z3483 Encounter for supervision of other normal pregnancy, third trimester: Secondary | ICD-10-CM

## 2016-11-25 DIAGNOSIS — N90810 Female genital mutilation status, unspecified: Secondary | ICD-10-CM

## 2016-11-25 DIAGNOSIS — Z3480 Encounter for supervision of other normal pregnancy, unspecified trimester: Secondary | ICD-10-CM

## 2016-11-25 DIAGNOSIS — Z789 Other specified health status: Secondary | ICD-10-CM

## 2016-11-25 DIAGNOSIS — Z603 Acculturation difficulty: Secondary | ICD-10-CM

## 2016-11-25 NOTE — Progress Notes (Signed)
   PRENATAL VISIT NOTE  Subjective:  Kelsey Ramirez is a 31 y.o. G2P1001 at [redacted]w[redacted]d being seen today for ongoing prenatal care.  She is currently monitored for the following issues for this low-risk pregnancy and has Supervision of normal pregnancy, antepartum; Previous cesarean delivery, antepartum; Language barrier affecting health care; Female circumcision; and Short interval between pregnancies affecting pregnancy in third trimester, antepartum on her problem list.  Patient reports occasional contractions.  Contractions: Not present. Vag. Bleeding: None.  Movement: Present. Denies leaking of fluid.   The following portions of the patient's history were reviewed and updated as appropriate: allergies, current medications, past family history, past medical history, past social history, past surgical history and problem list. Problem list updated.  Objective:   Vitals:   11/25/16 1413  BP: 96/74  Pulse: (!) 106  Weight: 124 lb (56.2 kg)    Fetal Status: Fetal Heart Rate (bpm): 129 Fundal Height: 34 cm Movement: Present     General:  Alert, oriented and cooperative. Patient is in no acute distress.  Skin: Skin is warm and dry. No rash noted.   Cardiovascular: Normal heart rate noted  Respiratory: Normal respiratory effort, no problems with respiration noted  Abdomen: Soft, gravid, appropriate for gestational age.  Pain/Pressure: Present     Pelvic: Cervical exam deferred        Extremities: Normal range of motion.  Edema: None  Mental Status:  Normal mood and affect. Normal behavior. Normal judgment and thought content.   Assessment and Plan:  Pregnancy: G2P1001 at [redacted]w[redacted]d  1. Supervision of other normal pregnancy, antepartum FHT and Fh normal  2. Previous cesarean delivery, antepartum Desires repeat  3. Language barrier affecting health care Interpreter used  4. Female circumcision   Preterm labor symptoms and general obstetric precautions including but not limited to vaginal  bleeding, contractions, leaking of fluid and fetal movement were reviewed in detail with the patient. Please refer to After Visit Summary for other counseling recommendations.  No Follow-up on file.   Levie Heritage, DO

## 2016-12-10 ENCOUNTER — Ambulatory Visit (INDEPENDENT_AMBULATORY_CARE_PROVIDER_SITE_OTHER): Payer: Medicaid Other | Admitting: Obstetrics and Gynecology

## 2016-12-10 VITALS — BP 104/64 | HR 88 | Wt 125.6 lb

## 2016-12-10 DIAGNOSIS — Z3483 Encounter for supervision of other normal pregnancy, third trimester: Secondary | ICD-10-CM

## 2016-12-10 DIAGNOSIS — Z789 Other specified health status: Secondary | ICD-10-CM

## 2016-12-10 DIAGNOSIS — O34219 Maternal care for unspecified type scar from previous cesarean delivery: Secondary | ICD-10-CM

## 2016-12-10 DIAGNOSIS — Z3480 Encounter for supervision of other normal pregnancy, unspecified trimester: Secondary | ICD-10-CM

## 2016-12-10 NOTE — Progress Notes (Signed)
Subjective:  Kelsey Ramirez is a 31 y.o. G2P1001 at [redacted]w[redacted]d being seen today for ongoing prenatal care.  She is currently monitored for the following issues for this high-risk pregnancy and has Supervision of normal pregnancy, antepartum; Previous cesarean delivery, antepartum; Language barrier affecting health care; Female circumcision; and Short interval between pregnancies affecting pregnancy in third trimester, antepartum on her problem list.  Patient reports no complaints.  Contractions: Not present. Vag. Bleeding: None.  Movement: Present. Denies leaking of fluid.   The following portions of the patient's history were reviewed and updated as appropriate: allergies, current medications, past family history, past medical history, past social history, past surgical history and problem list. Problem list updated.  Objective:   Vitals:   12/10/16 0936  BP: 104/64  Pulse: 88  Weight: 125 lb 9.6 oz (57 kg)    Fetal Status: Fetal Heart Rate (bpm): 140 Fundal Height: 36 cm Movement: Present     General:  Alert, oriented and cooperative. Patient is in no acute distress.  Skin: Skin is warm and dry. No rash noted.   Cardiovascular: Normal heart rate noted  Respiratory: Normal respiratory effort, no problems with respiration noted  Abdomen: Soft, gravid, appropriate for gestational age. Pain/Pressure: Absent     Pelvic:  Cervical exam deferred        Extremities: Normal range of motion.  Edema: None  Mental Status: Normal mood and affect. Normal behavior. Normal judgment and thought content.   Urinalysis:      Assessment and Plan:  Pregnancy: G2P1001 at [redacted]w[redacted]d  1. Supervision of other normal pregnancy, antepartum Stable Pt declined GBS and vaginal cultures today, prefers female provider.   2. Language barrier affecting health care Interrupter services used today  3. Previous cesarean delivery, antepartum Schedule for repeat c section 12/30/16  Term labor symptoms and general obstetric  precautions including but not limited to vaginal bleeding, contractions, leaking of fluid and fetal movement were reviewed in detail with the patient. Please refer to After Visit Summary for other counseling recommendations.  Return in about 1 week (around 12/17/2016) for OB visit.   Hermina Staggers, MD

## 2016-12-10 NOTE — Progress Notes (Signed)
[employer] interpreter [place] [phone]

## 2016-12-17 ENCOUNTER — Ambulatory Visit (INDEPENDENT_AMBULATORY_CARE_PROVIDER_SITE_OTHER): Payer: Medicaid Other | Admitting: Family Medicine

## 2016-12-17 ENCOUNTER — Telehealth (HOSPITAL_COMMUNITY): Payer: Self-pay | Admitting: *Deleted

## 2016-12-17 ENCOUNTER — Other Ambulatory Visit (HOSPITAL_COMMUNITY)
Admission: RE | Admit: 2016-12-17 | Discharge: 2016-12-17 | Disposition: A | Discharge status: Home / Self Care | Payer: Medicaid Other | Source: Ambulatory Visit | Attending: Family Medicine | Admitting: Family Medicine

## 2016-12-17 VITALS — BP 98/71 | HR 88 | Wt 126.2 lb

## 2016-12-17 DIAGNOSIS — Z113 Encounter for screening for infections with a predominantly sexual mode of transmission: Secondary | ICD-10-CM | POA: Diagnosis not present

## 2016-12-17 DIAGNOSIS — Z3483 Encounter for supervision of other normal pregnancy, third trimester: Secondary | ICD-10-CM | POA: Diagnosis present

## 2016-12-17 DIAGNOSIS — Z3480 Encounter for supervision of other normal pregnancy, unspecified trimester: Secondary | ICD-10-CM

## 2016-12-17 DIAGNOSIS — Z331 Pregnant state, incidental: Secondary | ICD-10-CM | POA: Diagnosis not present

## 2016-12-17 DIAGNOSIS — O34219 Maternal care for unspecified type scar from previous cesarean delivery: Secondary | ICD-10-CM | POA: Diagnosis not present

## 2016-12-17 DIAGNOSIS — Z789 Other specified health status: Secondary | ICD-10-CM

## 2016-12-17 NOTE — Progress Notes (Signed)
   PRENATAL VISIT NOTE  Subjective:  Kelsey Ramirez is a 31 y.o. G2P1001 at [redacted]w[redacted]d being seen today for ongoing prenatal care.  She is currently monitored for the following issues for this high-risk pregnancy and has Supervision of normal pregnancy, antepartum; Previous cesarean delivery, antepartum; Language barrier affecting health care; Female circumcision; and Short interval between pregnancies affecting pregnancy in third trimester, antepartum on her problem list.  Patient reports no complaints.  Contractions: Not present. Vag. Bleeding: None.  Movement: Present. Denies leaking of fluid.   The following portions of the patient's history were reviewed and updated as appropriate: allergies, current medications, past family history, past medical history, past social history, past surgical history and problem list. Problem list updated.  Objective:   Vitals:   12/17/16 0947  BP: 98/71  Pulse: 88  Weight: 126 lb 3.2 oz (57.2 kg)    Fetal Status: Fetal Heart Rate (bpm): 140   Movement: Present     General:  Alert, oriented and cooperative. Patient is in no acute distress.  Skin: Skin is warm and dry. No rash noted.   Cardiovascular: Normal heart rate noted  Respiratory: Normal respiratory effort, no problems with respiration noted  Abdomen: Soft, gravid, appropriate for gestational age.  Pain/Pressure: Absent     Pelvic: Cervical exam deferred        Extremities: Normal range of motion.  Edema: None  Mental Status:  Normal mood and affect. Normal behavior. Normal judgment and thought content.   Assessment and Plan:  Pregnancy: G2P1001 at [redacted]w[redacted]d  1. Supervision of other normal pregnancy, antepartum - GBS and GC/Chlamydia collected  2. Previous cesarean delivery, antepartum - Has repeat C/S scheduled for 12/30/16  3. Language barrier affecting health care - [employer] Arabic interpreter used  Term labor symptoms and general obstetric precautions including but not limited to vaginal  bleeding, contractions, leaking of fluid and fetal movement were reviewed in detail with the patient. Please refer to After Visit Summary for other counseling recommendations.  No Follow-up on file.   Frederik Pear, MD   Future Appointments Date Time Provider Department Center  12/22/2016 3:00 PM Hermina Staggers, MD [facility]  12/29/2016 9:45 AM [facility] PAT 5 [facility] None

## 2016-12-17 NOTE — Pre-Procedure Instructions (Signed)
Interpreter number [id number]

## 2016-12-17 NOTE — Progress Notes (Signed)
Arabic video interpreter Claudine [phone] used

## 2016-12-17 NOTE — Telephone Encounter (Signed)
Preadmission screen  

## 2016-12-19 LAB — STREP GP B NAA: Strep Gp B NAA: NEGATIVE

## 2016-12-20 LAB — CERVICOVAGINAL ANCILLARY ONLY
CHLAMYDIA, DNA PROBE: NEGATIVE
Neisseria Gonorrhea: NEGATIVE

## 2016-12-21 ENCOUNTER — Telehealth (HOSPITAL_COMMUNITY): Payer: Self-pay | Admitting: *Deleted

## 2016-12-21 NOTE — Pre-Procedure Instructions (Signed)
Interpreter number [id number] 

## 2016-12-21 NOTE — Telephone Encounter (Signed)
Preadmission screen  

## 2016-12-22 ENCOUNTER — Ambulatory Visit (INDEPENDENT_AMBULATORY_CARE_PROVIDER_SITE_OTHER): Payer: Medicaid Other | Admitting: Obstetrics and Gynecology

## 2016-12-22 VITALS — BP 99/73 | HR 84 | Wt 126.1 lb

## 2016-12-22 DIAGNOSIS — Z029 Encounter for administrative examinations, unspecified: Secondary | ICD-10-CM

## 2016-12-22 DIAGNOSIS — O368130 Decreased fetal movements, third trimester, not applicable or unspecified: Secondary | ICD-10-CM | POA: Diagnosis not present

## 2016-12-22 DIAGNOSIS — Z3483 Encounter for supervision of other normal pregnancy, third trimester: Secondary | ICD-10-CM

## 2016-12-22 DIAGNOSIS — Z789 Other specified health status: Secondary | ICD-10-CM

## 2016-12-22 DIAGNOSIS — N90810 Female genital mutilation status, unspecified: Secondary | ICD-10-CM

## 2016-12-22 DIAGNOSIS — O09893 Supervision of other high risk pregnancies, third trimester: Secondary | ICD-10-CM

## 2016-12-22 DIAGNOSIS — O368190 Decreased fetal movements, unspecified trimester, not applicable or unspecified: Secondary | ICD-10-CM | POA: Insufficient documentation

## 2016-12-22 DIAGNOSIS — O34219 Maternal care for unspecified type scar from previous cesarean delivery: Secondary | ICD-10-CM

## 2016-12-22 DIAGNOSIS — Z3480 Encounter for supervision of other normal pregnancy, unspecified trimester: Secondary | ICD-10-CM

## 2016-12-22 NOTE — Progress Notes (Signed)
C/o decreased fetal movement since yesterday. Placed on monitor for NST.  Gave patient name /number for preop nurse as requested. Instructed patient to come to mau if she goes into labor.

## 2016-12-22 NOTE — Progress Notes (Signed)
Subjective:  Lahoma Rockeragwa Dimmick is a 31 y.o. G2P1001 at 5537w0d being seen today for ongoing prenatal care.  She is currently monitored for the following issues for this high-risk pregnancy and has Supervision of normal pregnancy, antepartum; Previous cesarean delivery, antepartum; Language barrier affecting health care; Female circumcision; Short interval between pregnancies affecting pregnancy in third trimester, antepartum; and Decreased fetal movement on her problem list.  Patient reports decreased FM today, no LOF or VB or ctx.  Contractions: Not present. Vag. Bleeding: None.  Movement: (!) Decreased. Denies leaking of fluid.   The following portions of the patient's history were reviewed and updated as appropriate: allergies, current medications, past family history, past medical history, past social history, past surgical history and problem list. Problem list updated.  Objective:   Vitals:   12/22/16 1518  BP: 99/73  Pulse: 84  Weight: 57.2 kg (126 lb 1.6 oz)    Fetal Status: Fetal Heart Rate (bpm): 129   Movement: (!) Decreased     General:  Alert, oriented and cooperative. Patient is in no acute distress.  Skin: Skin is warm and dry. No rash noted.   Cardiovascular: Normal heart rate noted  Respiratory: Normal respiratory effort, no problems with respiration noted  Abdomen: Soft, gravid, appropriate for gestational age. Pain/Pressure: Absent     Pelvic:  Cervical exam deferred        Extremities: Normal range of motion.  Edema: None  Mental Status: Normal mood and affect. Normal behavior. Normal judgment and thought content.   Urinalysis:      Assessment and Plan:  Pregnancy: G2P1001 at 2937w0d  1. Supervision of other normal pregnancy, antepartum Stable Reactive NST today  2. Previous cesarean delivery, antepartum For repeat next week  3. Language barrier affecting health care Interrupter services used for today's visit  4. Female circumcision   5. Short interval between  pregnancies affecting pregnancy in third trimester, antepartum   6. Decreased fetal movements in third trimester, single or unspecified fetus RNST today  Term labor symptoms and general obstetric precautions including but not limited to vaginal bleeding, contractions, leaking of fluid and fetal movement were reviewed in detail with the patient. Please refer to After Visit Summary for other counseling recommendations.  Return in about 1 week (around 12/29/2016) for OB visit.   Hermina StaggersErvin, Shaniyah Wix L, MD

## 2016-12-22 NOTE — Patient Instructions (Signed)
Please call Amy Landon,RN at [phone] for preoperative instructions.

## 2016-12-23 ENCOUNTER — Telehealth (HOSPITAL_COMMUNITY): Payer: Self-pay | Admitting: *Deleted

## 2016-12-23 ENCOUNTER — Other Ambulatory Visit: Payer: Self-pay | Admitting: Obstetrics and Gynecology

## 2016-12-23 NOTE — Telephone Encounter (Signed)
Preadmission screen  

## 2016-12-23 NOTE — Pre-Procedure Instructions (Signed)
Interpreter number [id number] 

## 2016-12-24 ENCOUNTER — Encounter (HOSPITAL_COMMUNITY): Payer: Self-pay

## 2016-12-24 NOTE — Pre-Procedure Instructions (Signed)
Interpreter number Coralee NorthNina Id [id number]

## 2016-12-28 NOTE — Progress Notes (Signed)
FMLA forms completed and charged. 

## 2016-12-29 ENCOUNTER — Encounter (HOSPITAL_COMMUNITY)
Admission: RE | Admit: 2016-12-29 | Discharge: 2016-12-29 | Disposition: A | Discharge status: Home / Self Care | Payer: Medicaid Other | Source: Ambulatory Visit | Attending: Obstetrics and Gynecology | Admitting: Obstetrics and Gynecology

## 2016-12-29 ENCOUNTER — Ambulatory Visit (INDEPENDENT_AMBULATORY_CARE_PROVIDER_SITE_OTHER): Payer: Medicaid Other | Admitting: Obstetrics & Gynecology

## 2016-12-29 VITALS — BP 101/66 | HR 75 | Wt 119.0 lb

## 2016-12-29 DIAGNOSIS — Z789 Other specified health status: Secondary | ICD-10-CM

## 2016-12-29 DIAGNOSIS — Z3480 Encounter for supervision of other normal pregnancy, unspecified trimester: Secondary | ICD-10-CM

## 2016-12-29 DIAGNOSIS — Z3483 Encounter for supervision of other normal pregnancy, third trimester: Secondary | ICD-10-CM

## 2016-12-29 DIAGNOSIS — O34219 Maternal care for unspecified type scar from previous cesarean delivery: Secondary | ICD-10-CM

## 2016-12-29 HISTORY — DX: Female genital mutilation status, unspecified: N90.810

## 2016-12-29 HISTORY — DX: Anemia, unspecified: D64.9

## 2016-12-29 LAB — CBC
HEMATOCRIT: 40.8 % (ref 36.0–46.0)
Hemoglobin: 13.7 g/dL (ref 12.0–15.0)
MCH: 29.1 pg (ref 26.0–34.0)
MCHC: 33.6 g/dL (ref 30.0–36.0)
MCV: 86.6 fL (ref 78.0–100.0)
Platelets: 294 10*3/uL (ref 150–400)
RBC: 4.71 MIL/uL (ref 3.87–5.11)
RDW: 15 % (ref 11.5–15.5)
WBC: 10.3 10*3/uL (ref 4.0–10.5)

## 2016-12-29 LAB — TYPE AND SCREEN
ABO/RH(D): O POS
Antibody Screen: NEGATIVE

## 2016-12-29 LAB — ABO/RH: ABO/RH(D): O POS

## 2016-12-29 NOTE — Progress Notes (Signed)
   PRENATAL VISIT NOTE  Subjective:  Kelsey Ramirez is a 31 y.o. G2P1001 at 3779w0d being seen today for ongoing prenatal care.  She is currently monitored for the following issues for this low-risk pregnancy and has Supervision of normal pregnancy, antepartum; Previous cesarean delivery, antepartum; Language barrier affecting health care; Female circumcision; Short interval between pregnancies affecting pregnancy in third trimester, antepartum; and Decreased fetal movement on her problem list.  Patient reports no complaints.  Contractions: Irregular. Vag. Bleeding: None.  Movement: Present. Denies leaking of fluid.   The following portions of the patient's history were reviewed and updated as appropriate: allergies, current medications, past family history, past medical history, past social history, past surgical history and problem list. Problem list updated.  Objective:   Vitals:   12/29/16 1305  BP: 101/66  Pulse: 75  Weight: 119 lb (54 kg)    Fetal Status:     Movement: Present     General:  Alert, oriented and cooperative. Patient is in no acute distress.  Skin: Skin is warm and dry. No rash noted.   Cardiovascular: Normal heart rate noted  Respiratory: Normal respiratory effort, no problems with respiration noted  Abdomen: Soft, gravid, appropriate for gestational age.  Pain/Pressure: Present     Pelvic: Cervical exam deferred        Extremities: Normal range of motion.  Edema: None  Mental Status:  Normal mood and affect. Normal behavior. Normal judgment and thought content.   Assessment and Plan:  Pregnancy: G2P1001 at 6279w0d  1. Supervision of other normal pregnancy, antepartum   2. Previous cesarean delivery, antepartum - RLTCS scheduled for tomorrow  3. Language barrier affecting health care - Interpretor present   Term labor symptoms and general obstetric precautions including but not limited to vaginal bleeding, contractions, leaking of fluid and fetal movement were  reviewed in detail with the patient. Please refer to After Visit Summary for other counseling recommendations.  No Follow-up on file.   Allie BossierMyra C Orian Figueira, MD

## 2016-12-29 NOTE — Progress Notes (Signed)
[employer] Interpreter [employer] Mohammad

## 2016-12-29 NOTE — Patient Instructions (Signed)
Kelsey Ramirez  12/29/2016   Your procedure is scheduleDaymon Ramirez on:  12/30/2016  Enter through the Main Entrance of [facility] at 1030 AM.  Pick up the phone at the desk and dial [phone]  Call this number if you have problems the morning of [phone]  Remember:   Do not eat food:After Midnight.  Do not drink clear liquids: After Midnight.  Take these medicines the morning of surgery with A SIP OF WATER: none   Do not wear jewelry, make-up or nail polish.  Do not wear lotions, powders, or perfumes. Do not wear deodorant.  Do not shave 48 hours prior to surgery.  Do not bring valuables to the hospital.  [facility] is not   responsible for any belongings or valuables brought to the hospital.  Contacts, dentures or bridgework may not be worn into surgery.  Leave suitcase in the car. After surgery it may be brought to your room.  For patients admitted to the hospital, checkout time is 11:00 AM the day of              discharge.    N/A   Please read over the following fact sheets that you were given:   Surgical Site Infection Prevention

## 2016-12-30 ENCOUNTER — Inpatient Hospital Stay (HOSPITAL_COMMUNITY): Payer: Medicaid Other | Admitting: Anesthesiology

## 2016-12-30 ENCOUNTER — Encounter (HOSPITAL_COMMUNITY): Payer: Self-pay | Admitting: *Deleted

## 2016-12-30 ENCOUNTER — Inpatient Hospital Stay (HOSPITAL_COMMUNITY)
Admission: AD | Admit: 2016-12-30 | Payer: Medicaid Other | Source: Ambulatory Visit | Admitting: Obstetrics and Gynecology

## 2016-12-30 ENCOUNTER — Inpatient Hospital Stay (HOSPITAL_COMMUNITY)
Admission: AD | Admit: 2016-12-30 | Discharge: 2017-01-01 | DRG: 788 | Disposition: A | Discharge status: Home / Self Care | Payer: Medicaid Other | Source: Ambulatory Visit | Attending: Obstetrics and Gynecology | Admitting: Obstetrics and Gynecology

## 2016-12-30 ENCOUNTER — Encounter (HOSPITAL_COMMUNITY)
Admission: AD | Disposition: A | Discharge status: Home / Self Care | Payer: Self-pay | Source: Ambulatory Visit | Attending: Obstetrics and Gynecology

## 2016-12-30 DIAGNOSIS — Z603 Acculturation difficulty: Secondary | ICD-10-CM | POA: Diagnosis present

## 2016-12-30 DIAGNOSIS — Z789 Other specified health status: Secondary | ICD-10-CM

## 2016-12-30 DIAGNOSIS — O34211 Maternal care for low transverse scar from previous cesarean delivery: Principal | ICD-10-CM | POA: Diagnosis present

## 2016-12-30 DIAGNOSIS — O34219 Maternal care for unspecified type scar from previous cesarean delivery: Secondary | ICD-10-CM | POA: Diagnosis present

## 2016-12-30 DIAGNOSIS — O4292 Full-term premature rupture of membranes, unspecified as to length of time between rupture and onset of labor: Secondary | ICD-10-CM | POA: Diagnosis present

## 2016-12-30 DIAGNOSIS — Z3490 Encounter for supervision of normal pregnancy, unspecified, unspecified trimester: Secondary | ICD-10-CM

## 2016-12-30 DIAGNOSIS — Z3A39 39 weeks gestation of pregnancy: Secondary | ICD-10-CM

## 2016-12-30 DIAGNOSIS — N90810 Female genital mutilation status, unspecified: Secondary | ICD-10-CM | POA: Diagnosis present

## 2016-12-30 DIAGNOSIS — Z3480 Encounter for supervision of other normal pregnancy, unspecified trimester: Secondary | ICD-10-CM

## 2016-12-30 DIAGNOSIS — O3483 Maternal care for other abnormalities of pelvic organs, third trimester: Secondary | ICD-10-CM | POA: Diagnosis present

## 2016-12-30 DIAGNOSIS — O09893 Supervision of other high risk pregnancies, third trimester: Secondary | ICD-10-CM

## 2016-12-30 DIAGNOSIS — O42913 Preterm premature rupture of membranes, unspecified as to length of time between rupture and onset of labor, third trimester: Secondary | ICD-10-CM | POA: Diagnosis present

## 2016-12-30 LAB — CBC
HCT: 35.1 % — ABNORMAL LOW (ref 36.0–46.0)
HEMOGLOBIN: 12 g/dL (ref 12.0–15.0)
MCH: 29.5 pg (ref 26.0–34.0)
MCHC: 34.2 g/dL (ref 30.0–36.0)
MCV: 86.2 fL (ref 78.0–100.0)
PLATELETS: 266 10*3/uL (ref 150–400)
RBC: 4.07 MIL/uL (ref 3.87–5.11)
RDW: 15.2 % (ref 11.5–15.5)
WBC: 13 10*3/uL — AB (ref 4.0–10.5)

## 2016-12-30 LAB — CREATININE, SERUM: CREATININE: 0.45 mg/dL (ref 0.44–1.00)

## 2016-12-30 LAB — RPR: RPR: NONREACTIVE

## 2016-12-30 LAB — POCT FERN TEST: POCT Fern Test: POSITIVE

## 2016-12-30 SURGERY — Surgical Case
Anesthesia: Epidural | Site: Abdomen | Wound class: Clean Contaminated

## 2016-12-30 MED ORDER — LACTATED RINGERS IV SOLN
INTRAVENOUS | Status: DC
  Administered 2016-12-30 (×3): via INTRAVENOUS

## 2016-12-30 MED ORDER — TETANUS-DIPHTH-ACELL PERTUSSIS 5-2.5-18.5 LF-MCG/0.5 IM SUSP: 0.5000 mL | Freq: Once | INTRAMUSCULAR | Status: DC

## 2016-12-30 MED ORDER — DIBUCAINE 1 % RE OINT: 1.0000 "application " | TOPICAL_OINTMENT | RECTAL | Status: DC | PRN

## 2016-12-30 MED ORDER — NALBUPHINE HCL 10 MG/ML IJ SOLN: 5.0000 mg | Freq: Once | INTRAMUSCULAR | Status: DC | PRN

## 2016-12-30 MED ORDER — PHENYLEPHRINE 8 MG IN D5W 100 ML (0.08MG/ML) PREMIX OPTIME
INJECTION | INTRAVENOUS | Status: DC | PRN
  Administered 2016-12-30: 30 ug/min via INTRAVENOUS

## 2016-12-30 MED ORDER — PHENYLEPHRINE 8 MG IN D5W 100 ML (0.08MG/ML) PREMIX OPTIME
INJECTION | INTRAVENOUS | Status: AC
  Filled 2016-12-30: qty 100

## 2016-12-30 MED ORDER — LACTATED RINGERS IV SOLN
INTRAVENOUS | Status: DC | PRN
  Administered 2016-12-30 (×2): via INTRAVENOUS

## 2016-12-30 MED ORDER — ACETAMINOPHEN 325 MG PO TABS: 650.0000 mg | ORAL_TABLET | ORAL | Status: DC | PRN

## 2016-12-30 MED ORDER — FENTANYL CITRATE (PF) 100 MCG/2ML IJ SOLN: 25.0000 ug | INTRAMUSCULAR | Status: DC | PRN

## 2016-12-30 MED ORDER — LACTATED RINGERS IV SOLN
INTRAVENOUS | Status: DC
  Administered 2016-12-30: 08:00:00 via INTRAVENOUS

## 2016-12-30 MED ORDER — KETOROLAC TROMETHAMINE 30 MG/ML IJ SOLN
30.0000 mg | Freq: Four times a day (QID) | INTRAMUSCULAR | Status: AC | PRN
  Administered 2016-12-30: 30 mg via INTRAMUSCULAR

## 2016-12-30 MED ORDER — SIMETHICONE 80 MG PO CHEW
80.0000 mg | CHEWABLE_TABLET | Freq: Three times a day (TID) | ORAL | Status: DC
  Administered 2016-12-30 – 2017-01-01 (×5): 80 mg via ORAL
  Filled 2016-12-30 (×5): qty 1

## 2016-12-30 MED ORDER — OXYTOCIN 40 UNITS IN LACTATED RINGERS INFUSION - SIMPLE MED
2.5000 [IU]/h | INTRAVENOUS | Status: DC
Start: 2016-12-30 — End: 2016-12-30

## 2016-12-30 MED ORDER — NALBUPHINE HCL 10 MG/ML IJ SOLN: 5.0000 mg | INTRAMUSCULAR | Status: DC | PRN

## 2016-12-30 MED ORDER — OXYCODONE-ACETAMINOPHEN 5-325 MG PO TABS: 2.0000 | ORAL_TABLET | ORAL | Status: DC | PRN

## 2016-12-30 MED ORDER — ACETAMINOPHEN 500 MG PO TABS
1000.0000 mg | ORAL_TABLET | Freq: Four times a day (QID) | ORAL | Status: AC
  Administered 2016-12-30 – 2016-12-31 (×3): 1000 mg via ORAL
  Filled 2016-12-30 (×3): qty 2

## 2016-12-30 MED ORDER — OXYTOCIN BOLUS FROM INFUSION: 500.0000 mL | Freq: Once | INTRAVENOUS | Status: DC

## 2016-12-30 MED ORDER — PHENYLEPHRINE 40 MCG/ML (10ML) SYRINGE FOR IV PUSH (FOR BLOOD PRESSURE SUPPORT)
PREFILLED_SYRINGE | INTRAVENOUS | Status: AC
  Filled 2016-12-30: qty 20

## 2016-12-30 MED ORDER — LACTATED RINGERS IV SOLN: 500.0000 mL | Freq: Once | INTRAVENOUS | Status: DC

## 2016-12-30 MED ORDER — MENTHOL 3 MG MT LOZG: 1.0000 | LOZENGE | OROMUCOSAL | Status: DC | PRN

## 2016-12-30 MED ORDER — ZOLPIDEM TARTRATE 5 MG PO TABS: 5.0000 mg | ORAL_TABLET | Freq: Every evening | ORAL | Status: DC | PRN

## 2016-12-30 MED ORDER — PRENATAL MULTIVITAMIN CH
1.0000 | ORAL_TABLET | Freq: Every day | ORAL | Status: DC
  Filled 2016-12-30: qty 1

## 2016-12-30 MED ORDER — FENTANYL CITRATE (PF) 100 MCG/2ML IJ SOLN: 50.0000 ug | INTRAMUSCULAR | Status: DC | PRN

## 2016-12-30 MED ORDER — NALOXONE HCL 0.4 MG/ML IJ SOLN: 0.4000 mg | INTRAMUSCULAR | Status: DC | PRN

## 2016-12-30 MED ORDER — PHENYLEPHRINE 40 MCG/ML (10ML) SYRINGE FOR IV PUSH (FOR BLOOD PRESSURE SUPPORT): 80.0000 ug | PREFILLED_SYRINGE | INTRAVENOUS | Status: DC | PRN

## 2016-12-30 MED ORDER — SODIUM CHLORIDE 0.9 % IR SOLN
Status: DC | PRN
  Administered 2016-12-30: 1

## 2016-12-30 MED ORDER — DIPHENHYDRAMINE HCL 50 MG/ML IJ SOLN: 12.5000 mg | INTRAMUSCULAR | Status: DC | PRN

## 2016-12-30 MED ORDER — FENTANYL 2.5 MCG/ML BUPIVACAINE 1/10 % EPIDURAL INFUSION (WH - ANES)
14.0000 mL/h | INTRAMUSCULAR | Status: DC | PRN
  Administered 2016-12-30: 12 mL/h via EPIDURAL

## 2016-12-30 MED ORDER — CEFAZOLIN SODIUM-DEXTROSE 2-3 GM-%(50ML) IV SOLR
INTRAVENOUS | Status: AC
  Filled 2016-12-30: qty 50

## 2016-12-30 MED ORDER — DIPHENHYDRAMINE HCL 25 MG PO CAPS
25.0000 mg | ORAL_CAPSULE | Freq: Four times a day (QID) | ORAL | Status: DC | PRN
  Filled 2016-12-30: qty 1

## 2016-12-30 MED ORDER — LIDOCAINE HCL (PF) 1 % IJ SOLN
INTRAMUSCULAR | Status: DC | PRN
  Administered 2016-12-30: 5 mL via EPIDURAL
  Administered 2016-12-30: 3 mL via EPIDURAL

## 2016-12-30 MED ORDER — OXYTOCIN 10 UNIT/ML IJ SOLN
INTRAMUSCULAR | Status: AC
  Filled 2016-12-30: qty 4

## 2016-12-30 MED ORDER — ENOXAPARIN SODIUM 40 MG/0.4ML ~~LOC~~ SOLN
40.0000 mg | SUBCUTANEOUS | Status: DC
  Administered 2016-12-31 – 2017-01-01 (×2): 40 mg via SUBCUTANEOUS
  Filled 2016-12-30 (×3): qty 0.4

## 2016-12-30 MED ORDER — MORPHINE SULFATE (PF) 0.5 MG/ML IJ SOLN
INTRAMUSCULAR | Status: DC | PRN
  Administered 2016-12-30: 1 mg via INTRAVENOUS
  Administered 2016-12-30: 4 mg via EPIDURAL

## 2016-12-30 MED ORDER — TERBUTALINE SULFATE 1 MG/ML IJ SOLN: 0.2500 mg | Freq: Once | INTRAMUSCULAR | Status: DC | PRN

## 2016-12-30 MED ORDER — SODIUM BICARBONATE 8.4 % IV SOLN
INTRAVENOUS | Status: DC | PRN
  Administered 2016-12-30: 2 mL via EPIDURAL
  Administered 2016-12-30: 5 mL via EPIDURAL
  Administered 2016-12-30: 3 mL via EPIDURAL

## 2016-12-30 MED ORDER — ONDANSETRON HCL 4 MG/2ML IJ SOLN
INTRAMUSCULAR | Status: AC
  Filled 2016-12-30: qty 2

## 2016-12-30 MED ORDER — WITCH HAZEL-GLYCERIN EX PADS
1.0000 | MEDICATED_PAD | CUTANEOUS | Status: DC | PRN
Start: 2016-12-30 — End: 2017-01-01

## 2016-12-30 MED ORDER — ONDANSETRON HCL 4 MG/2ML IJ SOLN: 4.0000 mg | Freq: Three times a day (TID) | INTRAMUSCULAR | Status: DC | PRN

## 2016-12-30 MED ORDER — ONDANSETRON HCL 4 MG/2ML IJ SOLN: 4.0000 mg | Freq: Four times a day (QID) | INTRAMUSCULAR | Status: DC | PRN

## 2016-12-30 MED ORDER — LACTATED RINGERS IV SOLN
INTRAVENOUS | Status: DC
  Administered 2016-12-31: 04:00:00 via INTRAVENOUS

## 2016-12-30 MED ORDER — COCONUT OIL OIL: 1.0000 "application " | TOPICAL_OIL | Status: DC | PRN

## 2016-12-30 MED ORDER — SENNOSIDES-DOCUSATE SODIUM 8.6-50 MG PO TABS
2.0000 | ORAL_TABLET | ORAL | Status: DC
  Administered 2016-12-30 – 2016-12-31 (×2): 2 via ORAL
  Filled 2016-12-30 (×2): qty 2

## 2016-12-30 MED ORDER — NALOXONE HCL 2 MG/2ML IJ SOSY: 1.0000 ug/kg/h | PREFILLED_SYRINGE | INTRAVENOUS | Status: DC | PRN

## 2016-12-30 MED ORDER — DIPHENHYDRAMINE HCL 25 MG PO CAPS
25.0000 mg | ORAL_CAPSULE | ORAL | Status: DC | PRN
  Administered 2016-12-30: 25 mg via ORAL

## 2016-12-30 MED ORDER — EPHEDRINE 5 MG/ML INJ
10.0000 mg | INTRAVENOUS | Status: DC | PRN
  Administered 2016-12-30: 10 mg via INTRAVENOUS

## 2016-12-30 MED ORDER — OXYCODONE-ACETAMINOPHEN 5-325 MG PO TABS: 1.0000 | ORAL_TABLET | ORAL | Status: DC | PRN

## 2016-12-30 MED ORDER — SIMETHICONE 80 MG PO CHEW: 80.0000 mg | CHEWABLE_TABLET | ORAL | Status: DC | PRN

## 2016-12-30 MED ORDER — SCOPOLAMINE 1 MG/3DAYS TD PT72: 1.0000 | MEDICATED_PATCH | Freq: Once | TRANSDERMAL | Status: DC

## 2016-12-30 MED ORDER — ONDANSETRON HCL 4 MG/2ML IJ SOLN
INTRAMUSCULAR | Status: DC | PRN
  Administered 2016-12-30: 4 mg via INTRAVENOUS

## 2016-12-30 MED ORDER — SIMETHICONE 80 MG PO CHEW
80.0000 mg | CHEWABLE_TABLET | ORAL | Status: DC
  Administered 2016-12-31: 80 mg via ORAL
  Filled 2016-12-30 (×2): qty 1

## 2016-12-30 MED ORDER — MORPHINE SULFATE (PF) 0.5 MG/ML IJ SOLN
INTRAMUSCULAR | Status: AC
  Filled 2016-12-30: qty 10

## 2016-12-30 MED ORDER — LIDOCAINE HCL (PF) 1 % IJ SOLN: 30.0000 mL | INTRAMUSCULAR | Status: DC | PRN

## 2016-12-30 MED ORDER — SOD CITRATE-CITRIC ACID 500-334 MG/5ML PO SOLN
30.0000 mL | ORAL | Status: DC | PRN
  Administered 2016-12-30: 30 mL via ORAL
  Filled 2016-12-30: qty 15

## 2016-12-30 MED ORDER — SODIUM CHLORIDE 0.9% FLUSH: 3.0000 mL | INTRAVENOUS | Status: DC | PRN

## 2016-12-30 MED ORDER — PROMETHAZINE HCL 25 MG/ML IJ SOLN: 6.2500 mg | INTRAMUSCULAR | Status: DC | PRN

## 2016-12-30 MED ORDER — HYDROXYZINE HCL 50 MG PO TABS: 50.0000 mg | ORAL_TABLET | Freq: Four times a day (QID) | ORAL | Status: DC | PRN

## 2016-12-30 MED ORDER — OXYTOCIN 40 UNITS IN LACTATED RINGERS INFUSION - SIMPLE MED: 1.0000 m[IU]/min | INTRAVENOUS | Status: DC

## 2016-12-30 MED ORDER — EPHEDRINE 5 MG/ML INJ
10.0000 mg | INTRAVENOUS | Status: DC | PRN
  Filled 2016-12-30: qty 4

## 2016-12-30 MED ORDER — LACTATED RINGERS IV SOLN: 500.0000 mL | INTRAVENOUS | Status: DC | PRN

## 2016-12-30 MED ORDER — FLEET ENEMA 7-19 GM/118ML RE ENEM: 1.0000 | ENEMA | Freq: Every day | RECTAL | Status: DC | PRN

## 2016-12-30 MED ORDER — OXYTOCIN 40 UNITS IN LACTATED RINGERS INFUSION - SIMPLE MED: 2.5000 [IU]/h | INTRAVENOUS | Status: AC

## 2016-12-30 MED ORDER — LACTATED RINGERS IV SOLN
INTRAVENOUS | Status: DC | PRN
  Administered 2016-12-30: 40 [IU] via INTRAVENOUS

## 2016-12-30 MED ORDER — PHENYLEPHRINE 40 MCG/ML (10ML) SYRINGE FOR IV PUSH (FOR BLOOD PRESSURE SUPPORT)
80.0000 ug | PREFILLED_SYRINGE | INTRAVENOUS | Status: AC | PRN
  Administered 2016-12-30 (×3): 80 ug via INTRAVENOUS

## 2016-12-30 MED ORDER — IBUPROFEN 600 MG PO TABS
600.0000 mg | ORAL_TABLET | Freq: Four times a day (QID) | ORAL | Status: DC
  Administered 2016-12-30 – 2017-01-01 (×7): 600 mg via ORAL
  Filled 2016-12-30 (×7): qty 1

## 2016-12-30 MED ORDER — KETOROLAC TROMETHAMINE 30 MG/ML IJ SOLN: 30.0000 mg | Freq: Four times a day (QID) | INTRAMUSCULAR | Status: AC | PRN

## 2016-12-30 MED ORDER — FENTANYL 2.5 MCG/ML BUPIVACAINE 1/10 % EPIDURAL INFUSION (WH - ANES)
INTRAMUSCULAR | Status: AC
  Filled 2016-12-30: qty 100

## 2016-12-30 SURGICAL SUPPLY — 37 items
BENZOIN TINCTURE PRP APPL 2/3 (GAUZE/BANDAGES/DRESSINGS) ×3 IMPLANT
CANISTER SUCT 3000ML PPV (MISCELLANEOUS) ×3 IMPLANT
CHLORAPREP W/TINT 26ML (MISCELLANEOUS) ×3 IMPLANT
CLOSURE WOUND 1/2 X4 (GAUZE/BANDAGES/DRESSINGS) ×1
DERMABOND ADVANCED (GAUZE/BANDAGES/DRESSINGS) ×2
DERMABOND ADVANCED .7 DNX12 (GAUZE/BANDAGES/DRESSINGS) ×1 IMPLANT
DRSG OPSITE POSTOP 4X10 (GAUZE/BANDAGES/DRESSINGS) ×3 IMPLANT
ELECT REM PT RETURN 9FT ADLT (ELECTROSURGICAL) ×3
ELECTRODE REM PT RTRN 9FT ADLT (ELECTROSURGICAL) ×1 IMPLANT
EXTRACTOR VACUUM KIWI (MISCELLANEOUS) ×3 IMPLANT
GLOVE BIOGEL PI IND STRL 7.0 (GLOVE) ×2 IMPLANT
GLOVE BIOGEL PI IND STRL 7.5 (GLOVE) ×1 IMPLANT
GLOVE BIOGEL PI INDICATOR 7.0 (GLOVE) ×4
GLOVE BIOGEL PI INDICATOR 7.5 (GLOVE) ×2
GLOVE SKINSENSE NS SZ7.0 (GLOVE) ×2
GLOVE SKINSENSE STRL SZ7.0 (GLOVE) ×1 IMPLANT
GOWN STRL REUS W/ TWL LRG LVL3 (GOWN DISPOSABLE) ×2 IMPLANT
GOWN STRL REUS W/ TWL XL LVL3 (GOWN DISPOSABLE) ×1 IMPLANT
GOWN STRL REUS W/TWL LRG LVL3 (GOWN DISPOSABLE) ×4
GOWN STRL REUS W/TWL XL LVL3 (GOWN DISPOSABLE) ×2
NS IRRIG 1000ML POUR BTL (IV SOLUTION) ×3 IMPLANT
PACK C SECTION WH (CUSTOM PROCEDURE TRAY) ×3 IMPLANT
PAD ABD 7.5X8 STRL (GAUZE/BANDAGES/DRESSINGS) ×3 IMPLANT
PAD OB MATERNITY 4.3X12.25 (PERSONAL CARE ITEMS) ×3 IMPLANT
PAD PREP 24X48 CUFFED NSTRL (MISCELLANEOUS) ×3 IMPLANT
PENCIL SMOKE EVAC W/HOLSTER (ELECTROSURGICAL) ×3 IMPLANT
STRIP CLOSURE SKIN 1/2X4 (GAUZE/BANDAGES/DRESSINGS) ×2 IMPLANT
SUT MNCRL 0 VIOLET CTX 36 (SUTURE) ×2 IMPLANT
SUT MON AB 4-0 PS1 27 (SUTURE) ×3 IMPLANT
SUT MONOCRYL 0 CTX 36 (SUTURE) ×4
SUT PLAIN 2 0 (SUTURE) ×2
SUT PLAIN 2 0 XLH (SUTURE) ×3 IMPLANT
SUT PLAIN ABS 2-0 CT1 27XMFL (SUTURE) ×1 IMPLANT
SUT VIC AB 0 CT1 36 (SUTURE) ×6 IMPLANT
SUT VIC AB 3-0 CT1 27 (SUTURE) ×2
SUT VIC AB 3-0 CT1 TAPERPNT 27 (SUTURE) ×1 IMPLANT
TOWEL OR 17X24 6PK STRL BLUE (TOWEL DISPOSABLE) ×6 IMPLANT

## 2016-12-30 NOTE — Progress Notes (Signed)
   Subjective: Lahoma Rockeragwa Argueta is a 31 y.o. G2P1001 at 7870w1d by LMP admitted for rupture of membranes.  Objective: BP (!) 93/52   Pulse 85   Temp 98.3 F (36.8 C) (Oral)   Resp 16   Ht 5\' 1"  (1.549 m)   Wt 54 kg (119 lb)   LMP 03/31/2016   SpO2 99%   BMI 22.48 kg/m  No intake/output data recorded. No intake/output data recorded.  FHT:  FHR: 140 bpm, variability: moderate,  accelerations:  Present,  decelerations:  Absent UC:   Irregular 2 UC's in 30 minutes SVE:   Dilation: Fingertip Effacement (%): 50 Station: -3 Exam by:: Arita Missawson CNM  Labs: Lab Results  Component Value Date   WBC 10.3 12/29/2016   HGB 13.7 12/29/2016   HCT 40.8 12/29/2016   MCV 86.6 12/29/2016   PLT 294 12/29/2016    Assessment / Plan: Spontaneous labor SROM Previous Cesarean Delivery  Labor: Prolonged Early Labor Preeclampsia:  n/a Fetal Wellbeing:  Category I Pain Control:  Labor support without medications I/D:  n/a Anticipated MOD:  Probable Cesarean Delivery  Raelyn Moraolitta Selenne Coggin, MSN, CNM 12/30/2016, 1:30 PM

## 2016-12-30 NOTE — Anesthesia Preprocedure Evaluation (Signed)
Anesthesia Evaluation  Patient identified by MRN, date of birth, ID band Patient awake    Reviewed: Allergy & Precautions, NPO status , Patient's Chart, lab work & pertinent test results  Airway Mallampati: II  TM Distance: >3 FB Neck ROM: Full    Dental  (+) Teeth Intact, Dental Advisory Given   Pulmonary neg pulmonary ROS,    Pulmonary exam normal breath sounds clear to auscultation       Cardiovascular negative cardio ROS Normal cardiovascular exam Rhythm:Regular Rate:Normal     Neuro/Psych negative neurological ROS     GI/Hepatic negative GI ROS, Neg liver ROS,   Endo/Other  negative endocrine ROS  Renal/GU negative Renal ROS     Musculoskeletal negative musculoskeletal ROS (+)   Abdominal   Peds  Hematology Plt 294k   Anesthesia Other Findings Day of surgery medications reviewed with the patient.  Reproductive/Obstetrics (+) Pregnancy H/o C-section x1                             Anesthesia Physical  Anesthesia Plan  ASA: II  Anesthesia Plan: Epidural   Post-op Pain Management:    Induction:   PONV Risk Score and Plan: 2 and Ondansetron, Dexamethasone and Scopolamine patch - Pre-op  Airway Management Planned:   Additional Equipment:   Intra-op Plan:   Post-operative Plan:   Informed Consent: I have reviewed the patients History and Physical, chart, labs and discussed the procedure including the risks, benefits and alternatives for the proposed anesthesia with the patient or authorized representative who has indicated his/her understanding and acceptance.   Dental advisory given  Plan Discussed with:   Anesthesia Plan Comments: (Epidural in situ.  Will dose to achieve surgical anesthesia for C-section.  Arabic interpreter utilized throughout entire patient encounter.)        Anesthesia Quick Evaluation

## 2016-12-30 NOTE — Op Note (Signed)
Please see the brief operative note for surgical details 

## 2016-12-30 NOTE — Anesthesia Procedure Notes (Signed)
Epidural Patient location during procedure: OB Start time: 12/30/2016 11:15 AM End time: 12/30/2016 11:20 AM  Staffing Anesthesiologist: Cecile HearingURK, STEPHEN EDWARD Performed: anesthesiologist   Preanesthetic Checklist Completed: patient identified, pre-op evaluation, timeout performed, IV checked, risks and benefits discussed and monitors and equipment checked  Epidural Patient position: sitting Prep: DuraPrep Patient monitoring: blood pressure and continuous pulse ox Approach: midline Location: L3-L4 Injection technique: LOR air  Needle:  Needle type: Tuohy  Needle gauge: 17 G Needle length: 9 cm Needle insertion depth: 5 cm Catheter size: 19 Gauge Catheter at skin depth: 10 cm Test dose: negative and Other (1% Lidocaine)  Additional Notes Patient identified.  Risk benefits discussed including failed block, incomplete pain control, headache, nerve damage, paralysis, blood pressure changes, nausea, vomiting, reactions to medication both toxic or allergic, and postpartum back pain.  Patient expressed understanding and wished to proceed.  All questions were answered.  Sterile technique used throughout procedure and epidural site dressed with sterile barrier dressing. No paresthesia or other complications noted. The patient did not experience any signs of intravascular injection such as tinnitus or metallic taste in mouth nor signs of intrathecal spread such as rapid motor block. Please see nursing notes for vital signs. Reason for block:procedure for pain

## 2016-12-30 NOTE — Progress Notes (Signed)
Arabic interpreter at bedside at 1040am, Kenard GowerNuha Mohammad.

## 2016-12-30 NOTE — Progress Notes (Signed)
Kelsey Ramirez is a 31 y.o. G2P1001 at 496w1d  admitted for rupture of membranes with history of prior cesarean, severe aversion to gyn exams, history of genital surgery, with prior desire to attempt TOLAC, who now has decided to go back to plan for repeat cesarean.    Subjective: I have spoken to the patient thru translator to the patient, and obtained consent for the repeat cesarean. Risks, likely outcomes known to pt.  Objective: BP 99/60   Pulse 92   Temp 98.3 F (36.8 C) (Oral)   Resp 16   Ht 5\' 1"  (1.549 m)   Wt 119 lb (54 kg)   LMP 03/31/2016   SpO2 99%   BMI 22.48 kg/m  No intake/output data recorded. No intake/output data recorded.  FHT:  FHR: 145 bpm, variability: moderate,  accelerations:  Present,  decelerations:  Absent UC:   irregular, every few minutes SVE:   Dilation: Fingertip Effacement (%): 50 Station: -3 Exam by:: Arita Missawson CNM  Labs: Lab Results  Component Value Date   WBC 10.3 12/29/2016   HGB 13.7 12/29/2016   HCT 40.8 12/29/2016   MCV 86.6 12/29/2016   PLT 294 12/29/2016    Assessment / Plan: requested repeat cesarean section.  Labor:  Preeclampsia:   Fetal Wellbeing:  Category I Pain Control:  Epidural I/D:  n/a Anticipated MOD:  repeat cesarean section. will follow current cesarean in 1.5 hours.  Palma Buster V 12/30/2016, 1:50 PM

## 2016-12-30 NOTE — Op Note (Signed)
12/30/2016  5:39 PM  PATIENT:  Kelsey Ramirez  31 y.o. female  PRE-OPERATIVE DIAGNOSIS: Pregnancy 39 weeks prior cesarean section 1 choosing repeat cesarean section. 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  PROCEDURE:  Procedure(s): REPEAT CESAREAN SECTION (N/A) low transverse incision  SURGEON:  Surgeon(s) and Role:    * Emelda FearFerguson, Cyndi LennertJohn V, MD - Primary  PHYSICIAN ASSISTANT:   ASSISTANTS: Heather, RNFA   ANESTHESIA:   spinal  EBL:  541 mL   BLOOD ADMINISTERED:none  DRAINS: Urinary Catheter (Foley)   LOCAL MEDICATIONS USED:  MARCAINE    and NONE  SPECIMEN:  No Specimen  DISPOSITION OF SPECIMEN:  PATHOLOGY  COUNTS:  YES  TOURNIQUET:  * No tourniquets in log *  DICTATION: .Dragon Dictation  PLAN OF CARE: Has inpatient admission status  PATIENT DISPOSITION:  PACU - hemodynamically stable.   Delay start of Pharmacological VTE agent (>24hrs) due to surgical blood loss or risk of bleeding: not applicable Indications: 31 year old female initially scheduled for repeat cesarean section who changed her mind and requested trial of labor after presenting with membrane rupture. This is despite history of female genital cutting as a child and prior cesarean section due to this. After developing an traction she requested early epidural but decided for repeat cesarean section. Details of procedure patient was taken operating room prepped and draped for lower abdominal surgery, transverse incision performed after timeout conducted Ancef administered and 2 g and procedure confirmed by surgical team. The abdomen was opened standard Pfannenstiel fashion, there was no significant adhesions in the abdominal cavity in transverse lower uterine segment incision made and clear fluid identified and the baby delivered through the abdominal incision  without difficulty. Cord blood samples were obtained after the cord was clamped at 1 minute. Placenta delivered intact [facility] presentation. Membrane remnants were present and were extracted apparently completely. Those genital running locking 0 Monocryl first layer, with a continuous running second layer also of 0 Monocryl. Peritoneal cavity was irrigated, anterior peritoneum closed with 2-0 Vicryl fascia closed with 0 Vicryl, subcutaneous tissues approximated with 20 plain and subcuticular 2-0 Vicryl close the skin with good tissue edge approximation and EBL estimated at 541 cc measured Sponge and needle counts were correct condition to recovery room good

## 2016-12-30 NOTE — Anesthesia Preprocedure Evaluation (Deleted)
Anesthesia Evaluation  Patient identified by MRN, date of birth, ID band Patient awake    Reviewed: Allergy & Precautions, NPO status , Patient's Chart, lab work & pertinent test results  Airway Mallampati: II  TM Distance: >3 FB Neck ROM: Full    Dental  (+) Teeth Intact, Dental Advisory Given   Pulmonary neg pulmonary ROS,    Pulmonary exam normal breath sounds clear to auscultation       Cardiovascular negative cardio ROS Normal cardiovascular exam Rhythm:Regular Rate:Normal     Neuro/Psych negative neurological ROS     GI/Hepatic negative GI ROS, Neg liver ROS,   Endo/Other  negative endocrine ROS  Renal/GU negative Renal ROS     Musculoskeletal negative musculoskeletal ROS (+)   Abdominal   Peds  Hematology negative hematology ROS (+) Plt 294k   Anesthesia Other Findings Day of surgery medications reviewed with the patient.  Reproductive/Obstetrics (+) Pregnancy H/o c-section                             Anesthesia Physical Anesthesia Plan  ASA: II  Anesthesia Plan: Spinal   Post-op Pain Management:    Induction:   PONV Risk Score and Plan: 2 and Ondansetron, Dexamethasone, Scopolamine patch - Pre-op and Treatment may vary due to age or medical condition  Airway Management Planned:   Additional Equipment:   Intra-op Plan:   Post-operative Plan:   Informed Consent: I have reviewed the patients History and Physical, chart, labs and discussed the procedure including the risks, benefits and alternatives for the proposed anesthesia with the patient or authorized representative who has indicated his/her understanding and acceptance.   Dental advisory given  Plan Discussed with: CRNA, Anesthesiologist and Surgeon  Anesthesia Plan Comments: (Discussed risks and benefits of and differences between spinal and general. Discussed risks of spinal including headache, backache,  failure, bleeding, infection, and nerve damage. Patient consents to spinal. Questions answered. Coagulation studies and platelet count acceptable.)        Anesthesia Quick Evaluation

## 2016-12-30 NOTE — Progress Notes (Signed)
Patient ID: Kelsey Ramirez, female   DOB: 04/17/1985, 31 y.o.   MRN: [id number] Patient comfortable after epidural and ready for VE. Print production plannerArabic Translator at bedside -- patient verbalizes discomfort with VE despite not feeling contractions.  She verbalizes her apprehension for the pain of a vaginal delivery. The patient is having difficulty relaxing enough to open legs for VE.  She wants to talk to her husband, because she is now considering a C/Section instead of TOLAC. CNM will allow time for her to relax and talk to husband.  Raelyn MoraRolitta Aylla Huffine, CNM  12/30/2016 12:00 PM

## 2016-12-30 NOTE — Anesthesia Pain Management Evaluation Note (Signed)
  CRNA Pain Management Visit Note  Patient: Kelsey Ramirez, 31 y.o., female  "Hello [facility] am a [facility] of the [facility]. We have an anesthesia team available at all times to provide care throughout the hospital, including epidural management and anesthesia for C-section. I don't know your plan for the delivery whether it a natural birth, water birth, IV sedation, nitrous supplementation, doula or epidural, but we want to meet your pain goals."   1.Was your pain managed to your expectations on prior hospitalizations?   Yes   2.What is your expectation for pain management during this hospitalization?     Epidural  3.How can we help you reach that goal?  Maintain epidural and plan C-Section  Record the patient's initial score and the patient's pain goal.   Pain: 0  Pain Goal: 1 [facility] wants you to be able to say your pain was always managed very well.  Argus Caraher 12/30/2016

## 2016-12-30 NOTE — MAU Note (Addendum)
LOF, clear, since 2 hours ago +bloody show  Denies recent VE  Denies any pain at this time  Has many questions about c/section versus vaginal delivery as well as anesthesia. [facility] Patient requesting to discuss delivery with MD.

## 2016-12-30 NOTE — Progress Notes (Signed)
This note also relates to the following rows which could not be included: Pulse Rate - Cannot attach notes to unvalidated device data SpO2 - Cannot attach notes to unvalidated device data  Medical interpreter at bedside during recovery

## 2016-12-30 NOTE — Transfer of Care (Signed)
Immediate Anesthesia Transfer of Care Note  Patient: Kelsey Ramirez  Procedure(s) Performed: REPEAT CESAREAN SECTION (N/A Abdomen)  Patient Location: PACU  Anesthesia Type:Epidural  Level of Consciousness: awake, alert  and oriented  Airway & Oxygen Therapy: Patient Spontanous Breathing  Post-op Assessment: Report given to RN and Post -op Vital signs reviewed and stable  Post vital signs: Reviewed and stable  Last Vitals:  Vitals:   12/30/16 1610 12/30/16 1614  BP: 116/87 130/88  Pulse: 94 (!) 204  Resp:    Temp:    SpO2:      Last Pain:  Vitals:   12/30/16 1600  TempSrc:   PainSc: 0-No pain         Complications: No apparent anesthesia complications

## 2016-12-30 NOTE — Progress Notes (Signed)
[employer] Interpreter used, [place]Saif [id number]

## 2016-12-30 NOTE — Anesthesia Preprocedure Evaluation (Signed)
Anesthesia Evaluation  Patient identified by MRN, date of birth, ID band Patient awake    Reviewed: Allergy & Precautions, NPO status , Patient's Chart, lab work & pertinent test results  Airway Mallampati: II  TM Distance: >3 FB Neck ROM: Full    Dental  (+) Teeth Intact, Dental Advisory Given   Pulmonary neg pulmonary ROS,    Pulmonary exam normal breath sounds clear to auscultation       Cardiovascular negative cardio ROS Normal cardiovascular exam Rhythm:Regular Rate:Normal     Neuro/Psych negative neurological ROS     GI/Hepatic negative GI ROS, Neg liver ROS,   Endo/Other  negative endocrine ROS  Renal/GU negative Renal ROS     Musculoskeletal negative musculoskeletal ROS (+)   Abdominal   Peds  Hematology negative hematology ROS (+) Plt 294k   Anesthesia Other Findings Day of surgery medications reviewed with the patient.  Reproductive/Obstetrics (+) Pregnancy H/o C-section x1                             Anesthesia Physical Anesthesia Plan  ASA: II  Anesthesia Plan: Epidural   Post-op Pain Management:    Induction:   PONV Risk Score and Plan: Treatment may vary due to age or medical condition  Airway Management Planned:   Additional Equipment:   Intra-op Plan:   Post-operative Plan:   Informed Consent: I have reviewed the patients History and Physical, chart, labs and discussed the procedure including the risks, benefits and alternatives for the proposed anesthesia with the patient or authorized representative who has indicated his/her understanding and acceptance.   Dental advisory given  Plan Discussed with:   Anesthesia Plan Comments: (Patient identified. Risks/Benefits/Options discussed with patient including but not limited to bleeding, infection, nerve damage, paralysis, failed block, incomplete pain control, headache, blood pressure changes, nausea,  vomiting, reactions to medication both or allergic, itching and postpartum back pain. Confirmed with bedside nurse the patient's most recent platelet count. Confirmed with patient that they are not currently taking any anticoagulation, have any bleeding history or any family history of bleeding disorders. Patient expressed understanding and wished to proceed. All questions were answered.   Arabic interpreter utilized throughout entire patient encounter.)        Anesthesia Quick Evaluation

## 2016-12-30 NOTE — H&P (Signed)
Obstetric History and Physical  Kelsey Ramirez is a 31 y.o. G2P1001 with IUP at [redacted]w[redacted]d presenting after rupture of membranes around 0600. Clear fluid, with some bloody show. Arabic [employer] Video interpreter used for this encounter.   Prenatal course remarkable for known female circumcision and her adamant refusal of pelvic exams.  Patient was scheduled for repeat cesarean section today, now has decided she wants a TOLAC even though previous cesarean section was elective in [place] due to her female circumcision. She now feels she can have a trial of labor (TOLAC) as long as she gets an early epidural so she can tolerate exams.  Patient states she has been having irregular contractions, minimal vaginal bleeding, with active fetal movement.  Accompanied by her husband, child and friends.   Prenatal Course [facility] Care[facility] Pregnancy complications or risks: Patient Active Problem List   Diagnosis Date Noted  . Preterm premature rupture of membranes in third trimester 12/30/2016  . Short interval between pregnancies affecting pregnancy in third trimester, antepartum 11/12/2016  . Language barrier affecting health care 06/23/2016  . 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 Supervision of normal pregnancy, antepartum 05/25/2016  . Previous cesarean delivery, antepartum 05/25/2016   Clinic [facility] Prenatal Labs  Dating LMP/13 Blood type: --/--/O POS, O POS (10/24 1030)    Genetic Screen 1 Screen: normal       AFP:   negative Antibody:NEG (10/24 1030)  Anatomic [place]  Normal Rubella: 3.97 (03/20 1139)  GTT  Third trimester: nl 2hr RPR: Non Reactive (10/24 1030)   Flu vaccine  Not given; allergic to eggs HBsAg: Negative (03/20 1139)   TDaP vaccine    10/28/16                                             HIV:   NR   Baby Food Br/Bo                          WUJ:[id number] (10/12 1111)  Contraception  Natural Family Planning Pap: December 2016 Nml per pt  Circumcision  does not wish to know sex of baby    Pediatrician Unsure   Support Person Husband      Past Medical History:  Diagnosis Date  . Anemia    last pregnancy  . Female circumcision     Past Surgical History:  Procedure Laterality Date  . CESAREAN SECTION      OB History  Gravida Para Term Preterm AB Living  2 1 1     1   SAB TAB Ectopic Multiple Live Births          1    # Outcome Date GA Lbr Len/2nd Weight Sex Delivery Anes PTL Lv  2 Current           1 Term 09/10/15 [redacted]w[redacted]d  5 lb 15.2 oz (2.7 kg) M CS-LTranv   LIV     Complications: Delivery by elective cesarean section    Obstetric Comments  G1: in [place]. Pt states was elective due to female circ.     Social History   Social History  . Marital status: Married    Spouse name: N/A  . Number of children: N/A  . Years of education: N/A   Social History Main Topics  . Smoking status: Never Smoker  . Smokeless tobacco: Never  Used  . Alcohol use No  . Drug use: No  . Sexual activity: Yes    Birth control/ protection: None   Other Topics Concern  . None   Social History Narrative  . None    No family history on file.  Prescriptions Prior to Admission  Medication Sig Dispense Refill Last Dose  . acetaminophen (TYLENOL) 500 MG tablet Take 1 tablet (500 mg total) by mouth every 6 (six) hours as needed. (Patient taking differently: Take 500 mg by mouth every 6 (six) hours as needed for moderate pain. ) 30 tablet 0 Taking  . calcium carbonate (TUMS - DOSED IN MG ELEMENTAL CALCIUM) 500 MG chewable tablet Chew 1 tablet by mouth daily as needed for indigestion or heartburn.   Taking  . Prenatal Vit-Fe Fumarate-FA (PREPLUS) 27-1 MG TABS Take 1 tablet by mouth daily. 30 tablet 12 Taking    Allergies  Allergen Reactions  . Banana (Diagnostic) Itching  . Eggs Or Egg-Derived Products Itching  . Strawberry (Diagnostic) Itching    Review of Systems: Negative except for what is mentioned in HPI.  Physical Exam: BP 119/85 (BP Location: Left Arm)    Pulse (!) 106   Temp 98 F (36.7 C) (Oral)   Resp 16   LMP 03/31/2016   SpO2 100%  CONSTITUTIONAL: Well-developed, well-nourished female in no acute distress.  HENT:  Normocephalic, atraumatic, External right and left ear normal. Oropharynx is clear and moist EYES: Conjunctivae and EOM are normal. Pupils are equal, round, and reactive to light. No scleral icterus.  NECK: Normal range of motion, supple, no masses SKIN: Skin is warm and dry. No rash noted. Not diaphoretic. No erythema. No pallor. NEUROLOGIC: Alert and oriented to person, place, and time. Normal reflexes, muscle tone coordination. No cranial nerve deficit noted. PSYCHIATRIC: Normal mood and affect. Normal behavior. Normal judgment and thought content. CARDIOVASCULAR: Normal heart rate noted, regular rhythm RESPIRATORY: Effort and breath sounds normal, no problems with respiration noted ABDOMEN: Soft, nontender, nondistended, gravid. MUSCULOSKELETAL: Normal range of motion. No edema and no tenderness. 2+ distal pulses.  Cervical Exam: Deferred Presentation: cephalic on bedside scan FHT:  Baseline rate 130 bpm   Variability moderate  Accelerations present   Decelerations none Contractions: Irregular   Pertinent Labs/Studies:   Results for orders placed or performed during the hospital encounter of 12/30/16 (from the past 24 hour(s))  POCT fern test     Status: None   Collection Time: 12/30/16  7:51 AM  Result Value Ref Range   POCT Fern Test Positive = ruptured amniotic membanes     Assessment : Kelsey Ramirez is a 31 y.o. G2P1001 at 459w1d being admitted for PROM, history of previous cesarean section, now desires TOLAC.  Plan: TOLAC: USing the Arabic interpreter, explained risks of TOLAC in detail. Emphasized risk of uterine rupture and consequences for maternal-fetal morbidity and mortality. Also discussed risk of disruption of female circumcision scar during delivery.  Alternatively, discussed risks of RCS. After  extensive discussion with her and her family, she elects for TOLAC, consent signed.  Will admit to L&D, she will obtain early epidural, then proceed with pelvic examination. Patient cautioned that she will feel some pressure and possibly some pain (she is under the impression epidural will make the delivery painless).  Expectant management for now with augmentation as needed with pitocin as per protocol.  FWB: Reassuring fetal heart tracing.  GBS negative Delivery plan: Hopeful for vaginal delivery   Jaynie CollinsUGONNA  Sheanna Dail, MD, [facility]AC[facility] Attending  Obstetrician & Microbiologist, [facility]

## 2016-12-31 ENCOUNTER — Encounter (HOSPITAL_COMMUNITY): Payer: Self-pay | Admitting: Obstetrics and Gynecology

## 2016-12-31 LAB — CBC
HCT: 31.8 % — ABNORMAL LOW (ref 36.0–46.0)
Hemoglobin: 10.9 g/dL — ABNORMAL LOW (ref 12.0–15.0)
MCH: 29.7 pg (ref 26.0–34.0)
MCHC: 34.3 g/dL (ref 30.0–36.0)
MCV: 86.6 fL (ref 78.0–100.0)
PLATELETS: 245 10*3/uL (ref 150–400)
RBC: 3.67 MIL/uL — ABNORMAL LOW (ref 3.87–5.11)
RDW: 15.3 % (ref 11.5–15.5)
WBC: 11.2 10*3/uL — ABNORMAL HIGH (ref 4.0–10.5)

## 2016-12-31 MED ORDER — MEPERIDINE HCL 25 MG/ML IJ SOLN: 6.2500 mg | INTRAMUSCULAR | Status: DC | PRN

## 2016-12-31 NOTE — Lactation Note (Signed)
This note was copied from a baby's chart. Lactation Consultation Note  Patient Name: Kelsey Ramirez ZOXWR'UToday's Date: 12/31/2016 Reason for consult: Initial assessment Baby at 18 hr of life. Upon entry mom was offering a bottle of formula. Dad stated that mom desire to ebf but baby "just bf then started crying". Suggested that mom manually express and spoon feed but mom declined. Parents were agreeable to offering formula with a spoon. Reviewed the risks of bottle feeding formula. Given formula feeding handouts. Discussed baby behavior, feeding frequency, baby belly size, voids, wt loss, breast changes, and nipple care. Given lactation handouts. Aware of OP services and support group.    Maternal Data Has patient been taught Hand Expression?: Yes Does the patient have breastfeeding experience prior to this delivery?: Yes  Feeding Feeding Type: Formula Length of feed: 20 min  LATCH Score Latch: Grasps breast easily, tongue down, lips flanged, rhythmical sucking.  Audible Swallowing: A few with stimulation  Type of Nipple: Everted at rest and after stimulation  Comfort (Breast/Nipple): Soft / non-tender  Hold (Positioning): Full assist, staff holds infant at breast  LATCH Score: 7  Interventions    Lactation Tools Discussed/Used WIC Program: No   Consult Status Consult Status: Follow-up Date: 01/01/17 Follow-up type: In-patient    Kelsey Ramirez 12/31/2016, 11:25 AM

## 2016-12-31 NOTE — Anesthesia Postprocedure Evaluation (Signed)
Anesthesia Post Note  Patient: Kelsey Ramirez  Procedure(s) Performed: REPEAT CESAREAN SECTION (N/A Abdomen)     Patient location during evaluation: PACU Anesthesia Type: Epidural Level of consciousness: awake, awake and alert and oriented Pain management: pain level controlled Vital Signs Assessment: post-procedure vital signs reviewed and stable Respiratory status: spontaneous breathing, nonlabored ventilation and respiratory function stable Cardiovascular status: stable Postop Assessment: no headache, no backache, epidural receding, patient able to bend at knees and no signs of nausea or vomiting Anesthetic complications: no    Last Vitals:  Vitals:   12/31/16 0405 12/31/16 0611  BP: (!) 93/57 (!) 94/55  Pulse: 70 77  Resp:  18  Temp:  36.9 C  SpO2: 96% 95%    Last Pain:  Vitals:   12/31/16 0611  TempSrc: Oral  PainSc:                  Cecile HearingStephen Edward Turk

## 2016-12-31 NOTE — Progress Notes (Signed)
Notified Dr Verta EllenMinott that pt states she is dizzy in bed. BP 87/46 HR 73 FF 1 below U. Scant lochia.Reviewed H?H pre and post delivery. No new orders received.

## 2016-12-31 NOTE — Anesthesia Postprocedure Evaluation (Signed)
Anesthesia Post Note  Patient: Kelsey Ramirez  Procedure(s) Performed: AN AD HOC LABOR EPIDURAL     Patient location during evaluation: Mother Baby Anesthesia Type: Epidural Level of consciousness: awake, awake and alert and oriented Pain management: pain level controlled Vital Signs Assessment: post-procedure vital signs reviewed and stable Respiratory status: spontaneous breathing, nonlabored ventilation and respiratory function stable Cardiovascular status: stable Postop Assessment: no headache, no backache, patient able to bend at knees, no apparent nausea or vomiting and adequate PO intake Anesthetic complications: no    Last Vitals:  Vitals:   12/31/16 0405 12/31/16 0611  BP: (!) 93/57 (!) 94/55  Pulse: 70 77  Resp:  18  Temp:  36.9 C  SpO2: 96% 95%    Last Pain:  Vitals:   12/31/16 0611  TempSrc: Oral  PainSc:    Pain Goal:                 Dougles Kimmey

## 2016-12-31 NOTE — Progress Notes (Signed)
POSTPARTUM PROGRESS NOTE  Post Partum Day 1  Subjective:  Kelsey Ramirez is a 31 y.o. G2P2002 s/p rLTCS at 2738w1d.  No acute events overnight. Reported to nurse via interpreter that she had some dizziness while laying in bed. Nurse reported that her orthostatics overnight were good. Patient was given breakfast so see if this improves symptoms.  She denies nausea or vomiting.  Pain is well controlled. Nurse anesthetist in room with patient to evaluate dizziness     Lochia Small.   Objective: Blood pressure (!) 88/54, pulse 75, temperature 98 F (36.7 C), temperature source Oral, resp. rate 18, height 5\' 1"  (1.549 m), weight 119 lb (54 kg), last menstrual period 03/31/2016, SpO2 98 %, unknown if currently breastfeeding.  Physical Exam:  General: alert, cooperative and no distress Chest: no respiratory distress Heart:regular rate, distal pulses intact Abdomen: soft, nontender,  Uterine Fundus: firm, appropriately tender DVT Evaluation: No calf swelling or tenderness Extremities: no edema    Recent Labs  12/30/16 2041 12/31/16 0535  HGB 12.0 10.9*  HCT 35.1* 31.8*    Assessment/Plan: Kelsey Rockeragwa Kulkarni is a 31 y.o. G2P2002 s/p rLTC at 2038w1d   PPD# 1 - Doing well Contraception: none (family planning) Feeding: breast Dispo: Plan for discharge tomorrow if patient continues to progress well.   LOS: 1 day   Lynnae PrudeKeriann S MinottMD 12/31/2016, 11:00 AM   CNM attestation Post Partum Day #1 I have seen and examined this patient and agree with above documentation in the resident's note.   Kelsey Rockeragwa Bourke is a 31 y.o. G2P2002 s/p rLTCS.  Pt denies problems with po intake; foley catheter still in place and reports dizziness. Pain is well controlled.  Plan for birth control is natural family planning (NFP).  Method of Feeding: breast  PE:  BP (!) 93/51 (BP Location: Right Arm)   Pulse 68   Temp 98 F (36.7 C) (Oral)   Resp 18   Ht 5\' 1"  (1.549 m)   Wt 54 kg (119 lb)   LMP 03/31/2016   SpO2  97%   Breastfeeding? Unknown   BMI 22.48 kg/m  Fundus firm  Plan for discharge: 01/01/17  Cam HaiSHAW, KIMBERLY, CNM 2:13 PM 12/31/2016

## 2017-01-01 MED ORDER — OXYCODONE-ACETAMINOPHEN 5-325 MG PO TABS
1.0000 | ORAL_TABLET | ORAL | Status: DC | PRN
  Administered 2017-01-01: 1 via ORAL
  Filled 2017-01-01: qty 1

## 2017-01-01 MED ORDER — IBUPROFEN 600 MG PO TABS: 600.0000 mg | ORAL_TABLET | Freq: Four times a day (QID) | ORAL | 0 refills | Status: DC | PRN

## 2017-01-01 MED ORDER — OXYCODONE-ACETAMINOPHEN 5-325 MG PO TABS: 1.0000 | ORAL_TABLET | ORAL | 0 refills | Status: DC | PRN

## 2017-01-01 NOTE — Discharge Summary (Signed)
OB Discharge Summary     Patient Name: Kelsey Ramirez DOB: 11-24-1985 MRN: [id number]  Date of admission: 12/30/2016 Delivering MD: Tilda Burrow   Date of discharge: 01/01/2017  Admitting diagnosis: 39WKS,WATER BROKE RCS Intrauterine pregnancy: [redacted]w[redacted]d     Secondary diagnosis:  Principal Problem:   Preterm premature rupture of membranes in third trimester Active Problems:   Supervision of normal pregnancy, antepartum   Previous cesarean delivery, antepartum   Language barrier affecting health care   Female circumcision   Short interval between pregnancies affecting pregnancy in third trimester, antepartum  Additional problems: none     Discharge diagnosis: Term Pregnancy Delivered                                                                                                Post partum procedures:none  Augmentation: N/A  Complications: None  Hospital course:  Sceduled C/S   31 y.o. yo G2P2002 at [redacted]w[redacted]d was admitted to the hospital 12/30/2016 for scheduled cesarean section with the following indication:Elective Repeat. She initially presented with PROM and was strongly considering a TOLAC. She has a severe aversion to pelvic exams, and so she was given and epidural and found to be fingertip dilated. After some reconsideration, she elected to have a repeat C/S and was taken to the OR. Membrane Rupture Time/Date: 5:50 AM ,12/30/2016   Patient delivered a Viable infant.12/30/2016  Details of operation can be found in separate operative note.  Pateint had an uncomplicated postpartum course.  She is ambulating, tolerating a regular diet, passing flatus, and urinating well. Patient is discharged home in stable condition on  01/01/17         Physical exam  Vitals:   12/31/16 0930 12/31/16 1402 12/31/16 1738 01/01/17 0544  BP: (!) 88/54 (!) 93/51 97/66 111/69  Pulse: 75 68 74 80  Resp: 18 18 18 18   Temp: 98 F (36.7 C) 98.8 F (37.1 C) 98.4 F (36.9 C) 98.7 F (37.1 C)   TempSrc: Oral Oral Oral Oral  SpO2: 98% 97% 100% 100%  Weight:      Height:       General: alert and cooperative Lochia: appropriate Uterine Fundus: firm Incision: honeycomb intact, dry DVT Evaluation: No evidence of DVT seen on physical exam. Labs: Lab Results  Component Value Date   WBC 11.2 (H) 12/31/2016   HGB 10.9 (L) 12/31/2016   HCT 31.8 (L) 12/31/2016   MCV 86.6 12/31/2016   PLT 245 12/31/2016   CMP Latest Ref Rng & Units 12/30/2016  Creatinine 0.44 - 1.00 mg/dL 5.78    Discharge instruction: per After Visit Summary and "Baby and Me Booklet".  After visit meds:  Allergies as of 01/01/2017      Reactions   Banana (diagnostic) Itching   Eggs Or Egg-derived Products Itching   Other Itching   eggplant   Strawberry (diagnostic) Itching      Medication List    STOP taking these medications   acetaminophen 500 MG tablet Commonly known as:  TYLENOL   calcium carbonate 500 MG chewable tablet Commonly known as:  TUMS -  dosed in mg elemental calcium     TAKE these medications   ibuprofen 600 MG tablet Commonly known as:  ADVIL,MOTRIN Take 1 tablet (600 mg total) by mouth every 6 (six) hours as needed.   oxyCODONE-acetaminophen 5-325 MG tablet Commonly known as:  PERCOCET/ROXICET Take 1-2 tablets by mouth every 4 (four) hours as needed for severe pain.   PREPLUS 27-1 MG Tabs Take 1 tablet by mouth daily.       Diet: routine diet  Activity: Advance as tolerated. Pelvic rest for 6 weeks.   Outpatient follow up:4 weeks Follow up Appt:Future Appointments Date Time Provider Department Center  02/03/2017 2:40 PM Marylene LandKooistra, Kathryn Lorraine, CNM [facility]   Follow up Visit:No Follow-up on file.  Postpartum contraception: Natural Family Planning  Newborn Data: Live born female  Birth Weight: 6 lb 11.1 oz (3035 g) APGAR: 8, 9  Newborn Delivery   Birth date/time:  12/30/2016 16:39:00 Delivery type:  C-Section, Low Transverse  C-section  categorization:  Repeat     Baby Feeding: Breast Disposition:home with mother   01/01/2017 Cam HaiSHAW, Shterna Laramee, CNM  10:27 AM

## 2017-01-03 ENCOUNTER — Encounter (HOSPITAL_COMMUNITY): Payer: Self-pay | Admitting: Emergency Medicine

## 2017-01-03 ENCOUNTER — Inpatient Hospital Stay (HOSPITAL_COMMUNITY)
Admission: AD | Admit: 2017-01-03 | Discharge: 2017-01-03 | Disposition: A | Discharge status: Home / Self Care | Payer: Medicaid Other | Source: Ambulatory Visit | Attending: Obstetrics & Gynecology | Admitting: Obstetrics & Gynecology

## 2017-01-03 DIAGNOSIS — Z4889 Encounter for other specified surgical aftercare: Secondary | ICD-10-CM

## 2017-01-03 DIAGNOSIS — Z7689 Persons encountering health services in other specified circumstances: Secondary | ICD-10-CM

## 2017-01-03 DIAGNOSIS — Z09 Encounter for follow-up examination after completed treatment for conditions other than malignant neoplasm: Secondary | ICD-10-CM | POA: Diagnosis present

## 2017-01-03 NOTE — Discharge Instructions (Signed)
-Take Docusate Sodium 3 times per day for constipation. Drink lots of water; increase fruits and vegetables to prevent constipation. Take ibuprofen for pain.       Home Care Instructions for Mom ACTIVITY  Gradually return to your regular activities.  Let yourself rest. Nap while your baby sleeps.  Avoid lifting anything that is heavier than 10 lb (4.5 kg) until your health care provider says it is okay.  Avoid activities that take a lot of effort and energy (are strenuous) until approved by your health care provider. Walking at a slow-to-moderate pace is usually safe.  If you had a cesarean delivery: ? Do not vacuum, climb stairs, or drive a car for 4-6 weeks. ? Have someone help you at home until you feel like you can do your usual activities yourself. ? Do exercises as told by your health care provider, if this applies.  VAGINAL BLEEDING You may continue to bleed for 4-6 weeks after delivery. Over time, the amount of blood usually decreases and the color of the blood usually gets lighter. However, the flow of bright red blood may increase if you have been too active. If you need to use more than one pad in an hour because your pad gets soaked, or if you pass a large clot:  Lie down.  Raise your feet.  Place a cold compress on your lower abdomen.  Rest.  Call your health care provider.  If you are breastfeeding, your period should return anytime between 8 weeks after delivery and the time that you stop breastfeeding. If you are not breastfeeding, your period should return 6-8 weeks after delivery. PERINEAL CARE The perineal area, or perineum, is the part of your body between your thighs. After delivery, this area needs special care. Follow these instructions as told by your health care provider.  Take warm tub baths for 15-20 minutes.  Use medicated pads and pain-relieving sprays and creams as told.  Do not use tampons or douches until vaginal bleeding has stopped.  Each  time you go to the bathroom: ? Use a peri bottle. ? Change your pad. ? Use towelettes in place of toilet paper until your stitches have healed.  Do Kegel exercises every day. Kegel exercises help to maintain the muscles that support the vagina, bladder, and bowels. You can do these exercises while you are standing, sitting, or lying down. To do Kegel exercises: ? Tighten the muscles of your abdomen and the muscles that surround your birth canal. ? Hold for a few seconds. ? Relax. ? Repeat until you have done this 5 times in a row.  To prevent hemorrhoids from developing or getting worse: ? Drink enough fluid to keep your urine clear or pale yellow. ? Avoid straining when having a bowel movement. ? Take over-the-counter medicines and stool softeners as told by your health care provider.  BREAST CARE  Wear a tight-fitting bra.  Avoid taking over-the-counter pain medicine for breast discomfort.  Apply ice to the breasts to help with discomfort as needed: ? Put ice in a plastic bag. ? Place a towel between your skin and the bag. ? Leave the ice on for 20 minutes or as told by your health care provider.  NUTRITION  Eat a well-balanced diet.  Do not try to lose weight quickly by cutting back on calories.  Take your prenatal vitamins until your postpartum checkup or until your health care provider tells you to stop.  POSTPARTUM DEPRESSION You may find yourself crying for  no apparent reason and unable to cope with all of the changes that come with having a newborn. This mood is called postpartum depression. Postpartum depression happens because your hormone levels change after delivery. If you have postpartum depression, get support from your partner, friends, and family. If the depression does not go away on its own after several weeks, contact your health care provider. BREAST SELF-EXAM Do a breast self-exam each month, at the same time of the month. If you are breastfeeding, check  your breasts just after a feeding, when your breasts are less full. If you are breastfeeding and your period has started, check your breasts on day 5, 6, or 7 of your period. Report any lumps, bumps, or discharge to your health care provider. Know that breasts are normally lumpy if you are breastfeeding. This is temporary, and it is not a health risk. INTIMACY AND SEXUALITY Avoid sexual activity for at least 3-4 weeks after delivery or until the brownish-red vaginal flow is completely gone. If you want to avoid pregnancy, use some form of birth control. You can get pregnant after delivery, even if you have not had your period. SEEK MEDICAL CARE IF:  You feel unable to cope with the changes that a child brings to your life, and these feelings do not go away after several weeks.  You notice a lump, a bump, or discharge on your breast.  SEEK IMMEDIATE MEDICAL CARE IF:  Blood soaks your pad in 1 hour or less.  You have: ? Severe pain or cramping in your lower abdomen. ? A bad-smelling vaginal discharge. ? A fever that is not controlled by medicine. ? A fever, and an area of your breast is red and sore. ? Pain or redness in your calf. ? Sudden, severe chest pain. ? Shortness of breath. ? Painful or bloody urination. ? Problems with your vision.  You vomit for 12 hours or longer.  You develop a severe headache.  You have serious thoughts about hurting yourself, your child, or anyone else.  This information is not intended to replace advice given to you by your health care provider. Make sure you discuss any questions you have with your health care provider. Document Released: 02/20/2000 Document Revised: 07/31/2015 Document Reviewed: 08/26/2014 Elsevier Interactive Patient Education  2017 [employer] Breast Pumping Tips If you are breastfeeding, there may be times when you cannot feed your baby directly. Returning to work or going on a trip are examples. Pumping allows you to store  breast milk and feed it to your baby later. You may not get much milk when you first start to pump. Your breasts should start to make more after a few days. If you pump at the times you usually feed your baby, you may be able to keep making enough milk to feed your baby without also using formula. The more often you pump, the more milk your body will make. When should I pump?  You can start to pump soon after you have your baby. Ask your doctor what is right for you and your baby.  If you are going back to work, start pumping a few weeks before. This gives you time to learn how to pump and to store a supply of milk.  When you are with your baby, feed your baby when he or she is hungry. Pump after each feeding.  When you are away from your baby for many hours, pump for about 15 minutes every 2-3 hours. Pump both breasts  at the same time if you can.  If your baby has a formula feeding, make sure to pump close to the same time.  If you drink any alcohol, wait 2 hours before pumping. How do I get ready to pump? Your let-down reflex is your body's natural reaction that makes your breast milk flow. It is easier to make your breast milk flow when you are relaxed. Try these things to help you relax:  Smell one of your baby's blankets or an item of clothing.  Look at a picture or video of your baby.  Sit in a quiet, private space.  Massage the breast you plan to pump.  Place soothing warmth on the breast.  Play relaxing music.  What are some breast pumping tips?  Wash your hands before you pump. You do not need to wash your nipples or breasts.  There are three ways to pump. You can: ? Use your hand to massage and squeeze your breast. ? Use a handheld manual pump. ? Use an electric pump.  Make sure the suction cup on the breast pump is the right size. Place the suction cup directly over the nipple. It can be painful or hurt your nipple if it is the wrong size or placed wrong.  Put a  small amount of purified or modified lanolin on your nipple and areola if you are sore.  If you are using an electric pump, change the speed and suction power to be more comfortable.  You may need a different type of pump if pumping hurts or you do not get a lot of milk. Your doctor can help you pick what type of pump to use.  Keep a full water bottle near you always. Drinking lots of fluid helps you make more milk.  You can store your milk to use later. Pumped breast milk can be stored in a sealable, sterile container or plastic bag. Always put the date you pumped it on the container. ? Milk can stay out at room temperature for up to 8 hours. ? You can store your milk in the refrigerator for up to 8 days. ? You can store your milk in the freezer for 3 months. Thaw frozen milk using warm water. Do not put it in the microwave.  Do not smoke. Ask your doctor for help. When should I call my doctor?  You have a hard time pumping.  You are worried you do not make enough milk.  You have nipple pain, soreness, or redness.  You want to take birth control pills. This information is not intended to replace advice given to you by your health care provider. Make sure you discuss any questions you have with your health care provider. Document Released: 08/11/2007 Document Revised: 07/31/2015 Document Reviewed: 12/15/2012 Elsevier Interactive Patient Education  2017 [employer].

## 2017-01-03 NOTE — MAU Provider Note (Signed)
Patient Kelsey Ramirez is a 31 y.o. G2P2002 At 2 days s/p repeat C-section here because she is concerned about bleeding around her incision site.   Patient denies nausea/vomiting, severe abdominal tenderness, fever, discharge from the wound or significant bleeding from the incision. She denies fever or overall malaise.   She also says that she has some swelling in her ankles; she is concerned if this is normal.   History     CSN: [id number]  Arrival date and time: 01/03/17 1522   None     Chief Complaint  Patient presents with  . Incisional Pain   HPI Patient had a repeat c-section on 12/30/2016 and was discharged home on 01-01-2017. She was sent home with her honeycomb dressing in place and told to take it off today. She and her husband were afraid to take off the honeycomb as they noticed a few drops of blood that had soaked through the honeycomb.  OB History    Gravida Para Term Preterm AB Living   2 2 2     2    SAB TAB Ectopic Multiple Live Births         0 2      Obstetric Comments   [place] Pt states was elective due to female circ.       Past Medical History:  Diagnosis Date  . Anemia    last pregnancy  . Female circumcision     Past Surgical History:  Procedure Laterality Date  . CESAREAN SECTION    . CESAREAN SECTION N/A 12/30/2016   Procedure: REPEAT CESAREAN SECTION;  Surgeon: Tilda BurrowFerguson, John V, MD;  [facility];  Service: Obstetrics;  Laterality: N/A;    History reviewed. No pertinent family history.  Social History  Substance Use Topics  . Smoking status: Never Smoker  . Smokeless tobacco: Never Used  . Alcohol use No    Allergies:  Allergies  Allergen Reactions  . Banana (Diagnostic) Itching  . Eggs Or Egg-Derived Products Itching  . Other Itching    eggplant  . Strawberry (Diagnostic) Itching    Prescriptions Prior to Admission  Medication Sig Dispense Refill Last Dose  . ibuprofen (ADVIL,MOTRIN) 600 MG  tablet Take 1 tablet (600 mg total) by mouth every 6 (six) hours as needed. 30 tablet 0 01/02/2017 at Unknown time  . oxyCODONE-acetaminophen (PERCOCET/ROXICET) 5-325 MG tablet Take 1-2 tablets by mouth every 4 (four) hours as needed for severe pain. 50 tablet 0 01/02/2017 at Unknown time  . Prenatal Vit-Fe Fumarate-FA (PREPLUS) 27-1 MG TABS Take 1 tablet by mouth daily. 30 tablet 12 01/02/2017 at Unknown time    Review of Systems  Constitutional: Negative.   HENT: Negative.   Respiratory: Negative.   Cardiovascular: Negative.   Gastrointestinal: Negative.   Genitourinary: Negative.   Musculoskeletal: Negative.   Psychiatric/Behavioral: Negative.    Physical Exam   Blood pressure 119/71, pulse 69, temperature 97.6 F (36.4 C), temperature source Oral, resp. rate 18, SpO2 100 %, unknown if currently breastfeeding.  Physical Exam  Constitutional: She is oriented to person, place, and time. She appears well-developed.  HENT:  Head: Normocephalic.  Neck: Normal range of motion.  Respiratory: Effort normal.  GI: Soft. She exhibits no distension and no mass. There is tenderness. There is no rebound and no guarding.  Musculoskeletal: Normal range of motion.  Slight edema of the ankles; non-pitting. No edema of the calves; no redness or swelling, skin is cool and dry to the touch.  Neurological: She is alert and oriented to person, place, and time.  Skin: Skin is warm and dry.  Psychiatric: She has a normal mood and affect.  Steristrips are clean and dry; small amount of dark brown blood dried on steristrips but no exudate, pus, odor or swelling.   MAU Course  Procedures  MDM -Honey comb dressing removal -Talked about normal physiological shift in fluids postpartum; reassured patient that it is normal to have some swelling.  -Encouraged ambulation, increased H20.  -Recommended colace for constipation; answered breastfeeding questions.   Assessment and Plan   1. Encounter for  examination of surgical site    2. Patient stable for discharge with instructions on how to keep the incision site clean.  3. Recommended colace and frequent nursing to prevent mastitis.  4. Return precautions reviewed; all questions answered.  5. Patient will call the clinic for a work-in if she needs to be seen before her post-partum visit at the end of November.  Charlesetta Garibaldi Kooistra CNM 01/03/2017, 4:18 PM

## 2017-01-03 NOTE — MAU Note (Signed)
Pt. Presents to mau c/o blood around the incision. Incision pain and legs swelling last night.

## 2017-01-26 ENCOUNTER — Ambulatory Visit (INDEPENDENT_AMBULATORY_CARE_PROVIDER_SITE_OTHER): Payer: Medicaid Other | Admitting: General Practice

## 2017-01-26 DIAGNOSIS — R300 Dysuria: Secondary | ICD-10-CM | POA: Diagnosis not present

## 2017-01-26 LAB — POCT URINALYSIS DIP (DEVICE)
Bilirubin Urine: NEGATIVE
Glucose, UA: NEGATIVE mg/dL
KETONES UR: NEGATIVE mg/dL
Nitrite: NEGATIVE
PH: 5.5 (ref 5.0–8.0)
PROTEIN: NEGATIVE mg/dL
SPECIFIC GRAVITY, URINE: 1.02 (ref 1.005–1.030)
UROBILINOGEN UA: 0.2 mg/dL (ref 0.0–1.0)

## 2017-01-26 MED ORDER — CEPHALEXIN 500 MG PO CAPS: 500.0000 mg | ORAL_CAPSULE | Freq: Two times a day (BID) | ORAL | 0 refills | Status: AC

## 2017-01-26 NOTE — Progress Notes (Signed)
I have reviewed this chart and agree with the RN assessment and management.    K. Meryl Yuriana Gaal, M.D. Attending Obstetrician & Gynecologist, [facility], [facility]  

## 2017-01-26 NOTE — Progress Notes (Signed)
Patient is here today presenting with dysuria since c-section on 10/25. Patient denies back pain and is able to empty her bladder all the way. Patient is breast and formula feeding.  Reviewed UA with Dr Earlene Plateravis who recommends urine culture. May given keflex 500mg  BID x 5 days, may need to change medication based off urine results. Discussed with patient. Patient verbalized understanding & had no questions

## 2017-01-31 ENCOUNTER — Ambulatory Visit: Payer: Medicaid Other | Admitting: General Practice

## 2017-01-31 ENCOUNTER — Ambulatory Visit: Payer: Medicaid Other

## 2017-01-31 DIAGNOSIS — R300 Dysuria: Secondary | ICD-10-CM

## 2017-01-31 NOTE — Progress Notes (Signed)
Patient came by office for urine culture results. Discussed with patient that unfortunately there was an error at the lab processing her urine and we will need to recollect culture. Patient verbalized understanding & specimen recollected. Asked patient if she is feeling better after taking antibiotics. Patient states she just started them but it is helping. Patient had no questions and is aware of pp visit scheduled 11/29. [employer] video interpreter #[id number] used for encounter.

## 2017-02-01 NOTE — Progress Notes (Signed)
I have reviewed this chart and agree with the RN assessment and management.    K. Meryl Ilya Ess, M.D. Attending Obstetrician & Gynecologist, [facility], [facility]  

## 2017-02-02 LAB — URINE CULTURE: ORGANISM ID, BACTERIA: NO GROWTH

## 2017-02-03 ENCOUNTER — Encounter: Payer: Self-pay | Admitting: Student

## 2017-02-03 ENCOUNTER — Ambulatory Visit (INDEPENDENT_AMBULATORY_CARE_PROVIDER_SITE_OTHER): Payer: Medicaid Other | Admitting: Student

## 2017-02-03 MED ORDER — DOCUSATE SODIUM 100 MG PO CAPS: 100.0000 mg | ORAL_CAPSULE | Freq: Three times a day (TID) | ORAL | 0 refills | Status: DC

## 2017-02-03 NOTE — Progress Notes (Signed)
Subjective:     Kelsey Ramirez is a 31 y.o. female who presents for a postpartum visit. She is 4 weeks postpartum following a low cervical transverse Cesarean section. I have fully reviewed the prenatal and intrapartum course. The delivery was at 39 gestational weeks. Outcome: repeat cesarean section, low transverse incision. Anesthesia: spinal. Postpartum course has been uneventful. Baby's course has been uneventful. Baby is feeding by both breast and bottle - Similac Sensitive RS. Bleeding no bleeding. Bowel function is normal. Bladder function is normal. Patient is not sexually active. Contraception method is none. Postpartum depression screening: negative.  Patient has many questions about when it is safe to have another baby. Patient with many questions about how to use rhythm method for family planning.  Patient does not seem to want to get pregnant again but does not want to use contractoipn.  She also complains about constipation and wants colace.   Review of Systems A comprehensive review of systems was negative.   Objective:    BP 97/71   Pulse 79   Wt 115 lb 11.9 oz (52.5 kg)   BMI 21.87 kg/m   General:  alert, cooperative and no distress   Breasts:  inspection negative, no nipple discharge or bleeding, no masses or nodularity palpable  Lungs: clear to auscultation bilaterally  Heart:  regular rate and rhythm, S1, S2 normal, no murmur, click, rub or gallop  Abdomen: soft, non-tender; bowel sounds normal; no masses,  no organomegaly   Vulva:  not evaluated  Vagina: not evaluated  Cervix:  not evaluated  Corpus: not examined  Adnexa:  not evaluated  Rectal Exam: Not performed.        Assessment:    Healthy postpartum exam. Pap smear not done at today's visit.   Plan:    1. Contraception: none. Reviewed with patient the imporatnce of spacing her pregnancies because of two c-sections so close together. Directed her to resources in Arabic on NFP.  2. Colace RX sent.  3.  Follow up in: 1 year for repeat pap.

## 2017-07-12 ENCOUNTER — Encounter: Payer: Self-pay | Admitting: *Deleted

## 2017-08-11 ENCOUNTER — Emergency Department (HOSPITAL_COMMUNITY)
Admission: EM | Admit: 2017-08-11 | Discharge: 2017-08-12 | Disposition: A | Discharge status: Home / Self Care | Payer: BLUE CROSS/BLUE SHIELD | Attending: Emergency Medicine | Admitting: Emergency Medicine

## 2017-08-11 ENCOUNTER — Encounter (HOSPITAL_COMMUNITY): Payer: Self-pay | Admitting: *Deleted

## 2017-08-11 ENCOUNTER — Other Ambulatory Visit: Payer: Self-pay

## 2017-08-11 DIAGNOSIS — H6123 Impacted cerumen, bilateral: Secondary | ICD-10-CM | POA: Diagnosis not present

## 2017-08-11 DIAGNOSIS — J029 Acute pharyngitis, unspecified: Secondary | ICD-10-CM | POA: Diagnosis not present

## 2017-08-11 DIAGNOSIS — Z79899 Other long term (current) drug therapy: Secondary | ICD-10-CM | POA: Insufficient documentation

## 2017-08-11 DIAGNOSIS — R0981 Nasal congestion: Secondary | ICD-10-CM | POA: Diagnosis present

## 2017-08-11 NOTE — ED Triage Notes (Signed)
Pt has been having ear pain, sore throat, and sneezing for the past 2 days.

## 2017-08-12 LAB — GROUP A STREP BY PCR: GROUP A STREP BY PCR: NOT DETECTED

## 2017-08-12 NOTE — Discharge Instructions (Signed)
You may take over-the-counter medicine for symptomatic relief, such as Tylenol, Motrin, TheraFlu, Alka seltzer , black elderberry, etc. Please limit acetaminophen (Tylenol) to 4000 mg and Ibuprofen (Motrin, Advil, etc.) to 2400 mg for a 24hr period. Please note that other over-the-counter medicine may contain acetaminophen or ibuprofen as a component of their ingredients.   

## 2017-08-12 NOTE — ED Provider Notes (Signed)
[facility] EMERGENCY DEPARTMENT Provider Note  CSN: [id number] Arrival date & time: 08/11/17 2306  Chief Complaint(s) Sore Throat and Otalgia  HPI Kelsey Ramirez is a 32 y.o. female who presents with 2 days of nasal congestion, rhinorrhea, sore throat, left otalgia.  Sore throat exacerbated with swallowing.  They also endorse mild cough.  She endorses mild headache.  Positive sick contacts at home with her children that have URI symptoms.  No notable fevers.  No nausea or vomiting.  No abdominal pain.  No chest pain or shortness of breath.  HPI  Past Medical History Past Medical History:  Diagnosis Date  . Anemia    last pregnancy  . Female circumcision    Patient Active Problem List   Diagnosis Date Noted  . Preterm premature rupture of membranes in third trimester 12/30/2016  . Short interval between pregnancies affecting pregnancy in third trimester, antepartum 11/12/2016  . Language barrier affecting health care 06/23/2016  . 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 Previous cesarean delivery, antepartum 05/25/2016   Home Medication(s) Prior to Admission medications   Medication Sig Start Date End Date Taking? Authorizing Provider  docusate sodium (COLACE) 100 MG capsule Take 1 capsule (100 mg total) by mouth 3 (three) times daily. 02/03/17   Marylene Land, CNM  ibuprofen (ADVIL,MOTRIN) 600 MG tablet Take 1 tablet (600 mg total) by mouth every 6 (six) hours as needed. Patient not taking: Reported on 02/03/2017 01/01/17   Arabella Merles, CNM  oxyCODONE-acetaminophen (PERCOCET/ROXICET) 5-325 MG tablet Take 1-2 tablets by mouth every 4 (four) hours as needed for severe pain. Patient not taking: Reported on 02/03/2017 01/01/17   Arabella Merles, CNM  Prenatal Vit-Fe Fumarate-FA (PREPLUS) 27-1 MG TABS Take 1 tablet by mouth daily. 10/28/16   Lesly Dukes, MD                                     Past Surgical History Past Surgical History:  Procedure Laterality Date  . CESAREAN SECTION    . CESAREAN SECTION N/A 12/30/2016   Procedure: REPEAT CESAREAN SECTION;  Surgeon: Tilda Burrow, MD;  Location: [facility] BIRTHING SUITES;  Service: Obstetrics;  Laterality: N/A;   Family History No family history on file.  Social History Social History   Tobacco Use  . Smoking status: Never Smoker  . Smokeless tobacco: Never Used  Substance Use Topics  . Alcohol use: No  . Drug use: No   Allergies Banana (diagnostic); Eggs or egg-derived products; Other; and Strawberry (diagnostic)  Review of Systems Review of Systems As noted in HPI Physical Exam Vital Signs  I have reviewed the triage vital signs BP 103/79 (BP Location: Right Arm)   Pulse (!) 106   Temp 97.9 F (36.6 C) (Oral)   LMP 07/17/2017   SpO2 100%   Physical Exam  Constitutional: She is oriented to person, place, and time. She appears well-developed and well-nourished. No distress.  HENT:  Head: Normocephalic and atraumatic.  Nose: Nose normal.  Mouth/Throat: Posterior oropharyngeal erythema present. No tonsillar abscesses. No tonsillar exudate.  Bilateral cerumen impaction.  After removal.  Bilateral TMs without erythema, injection. Left middle ear effusion.  Posterior oropharynx with erythema.  No exudate.  Eyes: Pupils are equal, round, and reactive to light. Conjunctivae and EOM are normal. Right eye exhibits no discharge. Left eye exhibits no discharge. No scleral icterus.  Neck: Normal range of motion.  Neck supple.  Cardiovascular: Normal rate and regular rhythm. Exam reveals no gallop and no friction rub.  No murmur heard. Pulmonary/Chest: Effort normal and breath sounds normal. No stridor. No respiratory distress. She has no rales.  Abdominal: Soft. She exhibits no distension. There is no tenderness.  Musculoskeletal: She exhibits no edema or tenderness.  Neurological: She  is alert and oriented to person, place, and time.  Skin: Skin is warm and dry. No rash noted. She is not diaphoretic. No erythema.  Psychiatric: She has a normal mood and affect.  Vitals reviewed.   ED Results and Treatments Labs (all labs ordered are listed, but only abnormal results are displayed) Labs Reviewed  GROUP A STREP BY PCR                                                                                                                         EKG  EKG Interpretation  Date/Time:    Ventricular Rate:    PR Interval:    QRS Duration:   QT Interval:    QTC Calculation:   R Axis:     Text Interpretation:        Radiology No results found. Pertinent labs & imaging results that were available during my care of the patient were reviewed by me and considered in my medical decision making (see chart for details).  Medications Ordered in ED Medications - No data to display                                                                                                                                  Procedures .Ear Cerumen Removal Date/Time: 08/12/2017 2:42 AM Performed by: Nira Conn, MD Authorized by: Nira Conn, MD   Consent:    Consent obtained:  Verbal   Consent given by:  Patient   Risks discussed:  Bleeding   Alternatives discussed:  No treatment Procedure details:    Location:  L ear and R ear   Procedure type: curette   Post-procedure details:    Patient tolerance of procedure:  Tolerated well, no immediate complications    (including critical care time)  Medical Decision Making / ED Course I have reviewed the nursing notes for this encounter and the patient's prior records (if available in EHR or on provided paperwork).    32 y.o. female presents with cough, rhinorrhea, sore throat and left otalgia for 2  days. adequate oral hydration. Rest of history as above.  Patient appears well. No signs of toxicity, patient is interactive  and playful. No hypoxia, tachypnea or other signs of respiratory distress. No sign of clinical dehydration. Lung exam clear. Rest of exam as above.  Most consistent with viral upper respiratory infection.   No evidence suggestive of  AOM, PNA, or meningitis.    Chest x-ray not indicated at this time.  Discussed symptomatic treatment with the patient and they will follow closely with their PCP.      Final Clinical Impression(s) / ED Diagnoses Final diagnoses:  Acute pharyngitis, unspecified etiology    Disposition: Discharge  Condition: Good  I have discussed the results, Dx and Tx plan with the patient and husband who expressed understanding and agree(s) with the plan. Discharge instructions discussed at great length. The patient and husband was given strict return precautions who verbalized understanding of the instructions. No further questions at time of discharge.    ED Discharge Orders    None       Follow Up: Reva BoresPratt, Tanya S, MD [address] [place] [id number] [phone]  Schedule an appointment as soon as possible for a visit  in 5-7 days, If symptoms do not improve or  worsen     This chart was dictated using voice recognition software.  Despite best efforts to proofread,  errors can occur which can change the documentation meaning.   Nira Connardama, Ionna Avis Eduardo, MD 08/12/17 0300

## 2017-09-01 ENCOUNTER — Encounter (HOSPITAL_COMMUNITY): Payer: Self-pay | Admitting: Emergency Medicine

## 2017-09-01 ENCOUNTER — Ambulatory Visit (HOSPITAL_COMMUNITY)
Admission: EM | Admit: 2017-09-01 | Discharge: 2017-09-01 | Disposition: A | Discharge status: Home / Self Care | Payer: BLUE CROSS/BLUE SHIELD | Attending: Family Medicine | Admitting: Family Medicine

## 2017-09-01 DIAGNOSIS — M791 Myalgia, unspecified site: Secondary | ICD-10-CM | POA: Diagnosis not present

## 2017-09-01 DIAGNOSIS — S39012A Strain of muscle, fascia and tendon of lower back, initial encounter: Secondary | ICD-10-CM

## 2017-09-01 MED ORDER — CYCLOBENZAPRINE HCL 5 MG PO TABS: 5.0000 mg | ORAL_TABLET | Freq: Every day | ORAL | 0 refills | Status: DC

## 2017-09-01 MED ORDER — NAPROXEN 375 MG PO TABS: 375.0000 mg | ORAL_TABLET | Freq: Two times a day (BID) | ORAL | 0 refills | Status: DC

## 2017-09-01 NOTE — ED Provider Notes (Signed)
[facility]    CSN: [id number] Arrival date & time: 09/01/17  1801     History   Chief Complaint Chief Complaint  Patient presents with  . Generalized Body Aches    HPI Kelsey Ramirez is a 32 y.o. female.   Cristian presents with husband with complaints of low back pain which started 3 days ago. At times has shoulder and neck pain as well. Worse when she first wakes in the morning. Worse when raising from sitting on the floor to standing. No numbness or tingling to legs or feet. No previous similar. No specific known injury. No urinary symptoms. No cough, sore throat, ear pain, abdominal pain, nausea, vomiting, diarrhea, rash, recent travel. Yesterday took tylenol which briefly helped. Without contributing medical history.      ROS per HPI.      Past Medical History:  Diagnosis Date  . Anemia    last pregnancy  . Female circumcision     Patient Active Problem List   Diagnosis Date Noted  . Preterm premature rupture of membranes in third trimester 12/30/2016  . Short interval between pregnancies affecting pregnancy in third trimester, antepartum 11/12/2016  . Language barrier affecting health care 06/23/2016  . 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 Previous cesarean delivery, antepartum 05/25/2016    Past Surgical History:  Procedure Laterality Date  . CESAREAN SECTION    . CESAREAN SECTION N/A 12/30/2016   Procedure: REPEAT CESAREAN SECTION;  Surgeon: Tilda Burrow, MD;  Location: [facility] BIRTHING SUITES;  Service: Obstetrics;  Laterality: N/A;    OB History    Gravida  2   Para  2   Term  2   Preterm      AB      Living  2     SAB      TAB      Ectopic      Multiple  0   Live Births  2        Obstetric Comments  G1: in [place]. Pt states was elective due to female circ.          Home Medications    Prior to Admission medications   Medication Sig Start Date End Date Taking? Authorizing Provider  cyclobenzaprine (FLEXERIL) 5  MG tablet Take 1 tablet (5 mg total) by mouth at bedtime. 09/01/17   Georgetta Haber, NP  docusate sodium (COLACE) 100 MG capsule Take 1 capsule (100 mg total) by mouth 3 (three) times daily. 02/03/17   Marylene Land, CNM  naproxen (NAPROSYN) 375 MG tablet Take 1 tablet (375 mg total) by mouth 2 (two) times daily. 09/01/17   Georgetta Haber, NP  Prenatal Vit-Fe Fumarate-FA (PREPLUS) 27-1 MG TABS Take 1 tablet by mouth daily. 10/28/16   Lesly Dukes, MD    Family History No family history on file.  Social History Social History   Tobacco Use  . Smoking status: Never Smoker  . Smokeless tobacco: Never Used  Substance Use Topics  . Alcohol use: No  . Drug use: No     Allergies   Banana (diagnostic); Eggs or egg-derived products; Other; and Strawberry (diagnostic)   Review of Systems Review of Systems   Physical Exam Triage Vital Signs ED Triage Vitals  Enc Vitals Group     BP 09/01/17 1819 99/68     Pulse Rate 09/01/17 1818 94     Resp 09/01/17 1818 18     Temp 09/01/17 1818 99.1 F (37.3  C)     Temp src --      SpO2 09/01/17 1818 100 %     Weight --      Height --      Head Circumference --      Peak Flow --      Pain Score --      Pain Loc --      Pain Edu? --      Excl. in GC? --    No data found.  Updated Vital Signs BP 99/68   Pulse 94   Temp 99.1 F (37.3 C)   Resp 18   SpO2 100%   Visual Acuity Right Eye Distance:   Left Eye Distance:   Bilateral Distance:    Right Eye Near:   Left Eye Near:    Bilateral Near:     Physical Exam  Constitutional: She is oriented to person, place, and time. She appears well-developed and well-nourished. No distress.  HENT:  Head: Normocephalic and atraumatic.  Right Ear: Tympanic membrane, external ear and ear canal normal.  Left Ear: Tympanic membrane, external ear and ear canal normal.  Nose: Nose normal.  Mouth/Throat: Uvula is midline, oropharynx is clear and moist and mucous membranes  are normal. No tonsillar exudate.  Eyes: Pupils are equal, round, and reactive to light. Conjunctivae and EOM are normal.  Cardiovascular: Normal rate, regular rhythm and normal heart sounds.  Pulmonary/Chest: Effort normal and breath sounds normal.  Musculoskeletal:       Lumbar back: She exhibits tenderness and pain. She exhibits normal range of motion, no swelling, no edema, no deformity, no laceration, no spasm and normal pulse.       Back:  Ambulatory without difficulty; tenderness to low back; no pain with bilateral hip flexion or straight leg raise; sensation intact and equal bilaterally; strength equal bilaterally; moving head, neck, arms, shoulders without difficulty   Neurological: She is alert and oriented to person, place, and time.  Skin: Skin is warm and dry.     UC Treatments / Results  Labs (all labs ordered are listed, but only abnormal results are displayed) Labs Reviewed - No data to display  EKG None  Radiology No results found.  Procedures Procedures (including critical care time)  Medications Ordered in UC Medications - No data to display  Initial Impression / Assessment and Plan / UC Course  I have reviewed the triage vital signs and the nursing notes.  Pertinent labs & imaging results that were available during my care of the patient were reviewed by me and considered in my medical decision making (see chart for details).     Without redflag findings on exam. Likely muscular strain to low back. nsaids provided. Exercises provided. Encouraged follow up with PCP for recheck for persistent symptoms. Patient and husband verbalized understanding and agreeable to plan.  Ambulatory out of clinic without difficulty.    Final Clinical Impressions(s) / UC Diagnoses   Final diagnoses:  Strain of lumbar region, initial encounter     Discharge Instructions     Light and regular activity as tolerated. Naproxen twice a day, take with food.  Flexeril at  night, can help with spasms. May cause drowsiness so do not take if driving or drinking alcohol.  Please establish with a primary care provider fore repeat evaluation if symptoms persist.    ED Prescriptions    Medication Sig Dispense Auth. Provider   naproxen (NAPROSYN) 375 MG tablet Take 1 tablet (375 mg total)  by mouth 2 (two) times daily. 20 tablet Linus Mako B, NP   cyclobenzaprine (FLEXERIL) 5 MG tablet Take 1 tablet (5 mg total) by mouth at bedtime. 15 tablet Georgetta Haber, NP     Controlled Substance Prescriptions [place] Controlled Substance Registry consulted? Not Applicable   Georgetta Haber, NP 09/01/17 [id number]

## 2017-09-01 NOTE — Discharge Instructions (Signed)
Light and regular activity as tolerated. Naproxen twice a day, take with food.  Flexeril at night, can help with spasms. May cause drowsiness so do not take if driving or drinking alcohol.  Please establish with a primary care provider fore repeat evaluation if symptoms persist.

## 2017-09-01 NOTE — ED Triage Notes (Signed)
Pt c/o body aches all over x3 days. No fevers. No other symptoms.

## 2017-09-07 ENCOUNTER — Ambulatory Visit: Payer: Self-pay | Admitting: Physician Assistant

## 2018-03-09 ENCOUNTER — Other Ambulatory Visit: Payer: Self-pay

## 2018-03-09 ENCOUNTER — Inpatient Hospital Stay (HOSPITAL_COMMUNITY): Payer: BLUE CROSS/BLUE SHIELD

## 2018-03-09 ENCOUNTER — Inpatient Hospital Stay (HOSPITAL_COMMUNITY)
Admission: AD | Admit: 2018-03-09 | Discharge: 2018-03-09 | Disposition: A | Discharge status: Home / Self Care | Payer: BLUE CROSS/BLUE SHIELD | Source: Ambulatory Visit | Attending: Obstetrics and Gynecology | Admitting: Obstetrics and Gynecology

## 2018-03-09 DIAGNOSIS — O209 Hemorrhage in early pregnancy, unspecified: Secondary | ICD-10-CM | POA: Diagnosis not present

## 2018-03-09 DIAGNOSIS — Z3A01 Less than 8 weeks gestation of pregnancy: Secondary | ICD-10-CM | POA: Diagnosis not present

## 2018-03-09 DIAGNOSIS — Z6790 Unspecified blood type, Rh positive: Secondary | ICD-10-CM

## 2018-03-09 DIAGNOSIS — O3680X0 Pregnancy with inconclusive fetal viability, not applicable or unspecified: Secondary | ICD-10-CM | POA: Diagnosis not present

## 2018-03-09 DIAGNOSIS — O4690 Antepartum hemorrhage, unspecified, unspecified trimester: Secondary | ICD-10-CM | POA: Diagnosis not present

## 2018-03-09 LAB — URINALYSIS, ROUTINE W REFLEX MICROSCOPIC
Bilirubin Urine: NEGATIVE
GLUCOSE, UA: NEGATIVE mg/dL
KETONES UR: NEGATIVE mg/dL
Leukocytes, UA: NEGATIVE
Nitrite: NEGATIVE
PROTEIN: NEGATIVE mg/dL
Specific Gravity, Urine: 1.008 (ref 1.005–1.030)
pH: 7 (ref 5.0–8.0)

## 2018-03-09 LAB — CBC
HCT: 37 % (ref 36.0–46.0)
Hemoglobin: 12.1 g/dL (ref 12.0–15.0)
MCH: 27.3 pg (ref 26.0–34.0)
MCHC: 32.7 g/dL (ref 30.0–36.0)
MCV: 83.3 fL (ref 80.0–100.0)
Platelets: 434 10*3/uL — ABNORMAL HIGH (ref 150–400)
RBC: 4.44 MIL/uL (ref 3.87–5.11)
RDW: 14 % (ref 11.5–15.5)
WBC: 12.3 10*3/uL — ABNORMAL HIGH (ref 4.0–10.5)
nRBC: 0 % (ref 0.0–0.2)

## 2018-03-09 LAB — HCG, QUANTITATIVE, PREGNANCY: hCG, Beta Chain, Quant, S: 10070 m[IU]/mL — ABNORMAL HIGH (ref <5)

## 2018-03-09 LAB — POCT PREGNANCY, URINE: PREG TEST UR: POSITIVE — AB

## 2018-03-09 NOTE — MAU Provider Note (Signed)
History     CSN: [id number]  Arrival date and time: 03/09/18 0340   First Provider Initiated Contact with Patient 03/09/18 [phone]      Chief Complaint  Patient presents with  . Vaginal Bleeding   G3P2002 @unknown  gestation presenting with VB. Reports onset about 2 hrs ago. Was initially brown then became red. Passed some clots. Reports LAP onset after bleeding. Rates 3/10. Has not taken anything for it.  Past Medical History:  Diagnosis Date  . Anemia    last pregnancy  . Female circumcision     Past Surgical History:  Procedure Laterality Date  . CESAREAN SECTION    . CESAREAN SECTION N/A 12/30/2016   Procedure: REPEAT CESAREAN SECTION;  Surgeon: Tilda BurrowFerguson, John V, MD;  [facility];  Service: Obstetrics;  Laterality: N/A;    No family history on file.  Social History   Tobacco Use  . Smoking status: Never Smoker  . Smokeless tobacco: Never Used  Substance Use Topics  . Alcohol use: No  . Drug use: No    Allergies:  Allergies  Allergen Reactions  . Banana (Diagnostic) Itching  . Eggs Or Egg-Derived Products Itching  . Other Itching    eggplant  . Strawberry (Diagnostic) Itching    Medications Prior to Admission  Medication Sig Dispense Refill Last Dose  . cyclobenzaprine (FLEXERIL) 5 MG tablet Take 1 tablet (5 mg total) by mouth at bedtime. 15 tablet 0   . docusate sodium (COLACE) 100 MG capsule Take 1 capsule (100 mg total) by mouth 3 (three) times daily. 90 capsule 0   . naproxen (NAPROSYN) 375 MG tablet Take 1 tablet (375 mg total) by mouth 2 (two) times daily. 20 tablet 0   . Prenatal Vit-Fe Fumarate-FA (PREPLUS) 27-1 MG TABS Take 1 tablet by mouth daily. 30 tablet 12 Taking    Review of Systems  Gastrointestinal: Positive for abdominal pain.  Genitourinary: Positive for vaginal bleeding.  Musculoskeletal: Positive for back pain.   Physical Exam   Blood pressure (!) 113/55, pulse 98, temperature 98.6 F (37 C), temperature source  Oral, resp. rate 17, weight 57.2 kg, unknown if currently breastfeeding.  Physical Exam  Constitutional: She is oriented to person, place, and time. She appears well-developed and well-nourished. She appears distressed.  HENT:  Head: Normocephalic and atraumatic.  Neck: Normal range of motion.  Respiratory: Effort normal. No respiratory distress.  Musculoskeletal: Normal range of motion.  Neurological: She is alert and oriented to person, place, and time.  Psychiatric: Her mood appears anxious (shaking).   Results for orders placed or performed during the hospital encounter of 03/09/18 (from the past 24 hour(s))  Urinalysis, Routine w reflex microscopic     Status: Abnormal   Collection Time: 03/09/18  4:06 AM  Result Value Ref Range   Color, Urine YELLOW YELLOW   APPearance CLEAR CLEAR   Specific Gravity, Urine 1.008 1.005 - 1.030   pH 7.0 5.0 - 8.0   Glucose, UA NEGATIVE NEGATIVE mg/dL   Hgb urine dipstick LARGE (A) NEGATIVE   Bilirubin Urine NEGATIVE NEGATIVE   Ketones, ur NEGATIVE NEGATIVE mg/dL   Protein, ur NEGATIVE NEGATIVE mg/dL   Nitrite NEGATIVE NEGATIVE   Leukocytes, UA NEGATIVE NEGATIVE   RBC / HPF >50 (H) 0 - 5 RBC/hpf   WBC, UA 11-20 0 - 5 WBC/hpf   Bacteria, UA RARE (A) NONE SEEN   Squamous Epithelial / LPF 0-5 0 - 5   Mucus PRESENT   Pregnancy, urine  POC     Status: Abnormal   Collection Time: 03/09/18  4:10 AM  Result Value Ref Range   Preg Test, Ur POSITIVE (A) NEGATIVE  CBC     Status: Abnormal   Collection Time: 03/09/18  4:57 AM  Result Value Ref Range   WBC 12.3 (H) 4.0 - 10.5 K/uL   RBC 4.44 3.87 - 5.11 MIL/uL   Hemoglobin 12.1 12.0 - 15.0 g/dL   HCT 81.0 17.5 - 10.2 %   MCV 83.3 80.0 - 100.0 fL   MCH 27.3 26.0 - 34.0 pg   MCHC 32.7 30.0 - 36.0 g/dL   RDW 58.5 27.7 - 82.4 %   Platelets 434 (H) 150 - 400 K/uL   nRBC 0.0 0.0 - 0.2 %  hCG, quantitative, pregnancy     Status: Abnormal   Collection Time: 03/09/18  4:57 AM  Result Value Ref Range    hCG, Beta Chain, Quant, S 10,070 (H) <5 mIU/mL   MAU Course  Procedures  MDM Labs and [place] ordered and reviewed. Pt refusing pelvic exam and pelvic US. Peripad w/scant red blood. No IUP or adnexal mass seen on abdominal US, findings could indicate early pregnancy, ectopic pregnancy, or failed pregnancy although evaluation today is incomplete since she's declining pelvic exam-discussed with pt. Will follow quant in 48 hrs. Bleeding on peripad is minimal. Stable for discharge home.   Assessment and Plan   1. Pregnancy, location unknown   2. Vaginal bleeding in pregnancy   3. Blood type, Rh positive    Discharge home Follow up in MAU in 2 days Ectopic/bleeding precautions  Allergies as of 03/09/2018      Reactions   Banana (diagnostic) Itching   Eggs Or Egg-derived Products Itching   Other Itching   eggplant   Strawberry (diagnostic) Itching      Medication List    STOP taking these medications   cyclobenzaprine 5 MG tablet Commonly known as:  FLEXERIL   docusate sodium 100 MG capsule Commonly known as:  COLACE   naproxen 375 MG tablet Commonly known as:  NAPROSYN     TAKE these medications   PREPLUS 27-1 MG Tabs Take 1 tablet by mouth daily.      Donette Larry, CNM 03/09/2018, 6:32 AM

## 2018-03-09 NOTE — MAU Note (Signed)
Pt presents to MAU c/o sudden onset of bleeding with clots and back pain. Pt reports LMP was last month but was lighter than normal. Pt is unsure if she is pregnant.

## 2018-03-09 NOTE — Discharge Instructions (Signed)
Vaginal Bleeding During Pregnancy, First Trimester    A small amount of bleeding (spotting) from the vagina is common during early pregnancy. Sometimes the bleeding is normal and does not cause problems. At other times, though, bleeding may be a sign of something serious. Tell your doctor about any bleeding from your vagina right away.  Follow these instructions at home:  Activity  · Follow your doctor's instructions about how active you can be.  · If needed, make plans for someone to help with your normal activities.  · Do not have sex or orgasms until your doctor says that this is safe.  General instructions  · Take over-the-counter and prescription medicines only as told by your doctor.  · Watch your condition for any changes.  · Write down:  ? The number of pads you use each day.  ? How often you change pads.  ? How soaked (saturated) your pads are.  · Do not use tampons.  · Do not douche.  · If you pass any tissue from your vagina, save it to show to your doctor.  · Keep all follow-up visits as told by your doctor. This is important.  Contact a doctor if:  · You have vaginal bleeding at any time while you are pregnant.  · You have cramps.  · You have a fever.  Get help right away if:  · You have very bad cramps in your back or belly (abdomen).  · You pass large clots or a lot of tissue from your vagina.  · Your bleeding gets worse.  · You feel light-headed.  · You feel weak.  · You pass out (faint).  · You have chills.  · You are leaking fluid from your vagina.  · You have a gush of fluid from your vagina.  Summary  · Sometimes vaginal bleeding during pregnancy is normal and does not cause problems. At other times, bleeding may be a sign of something serious.  · Tell your doctor about any bleeding from your vagina right away.  · Follow your doctor's instructions about how active you can be. You may need someone to help you with your normal activities.  This information is not intended to replace advice given to  you by your health care provider. Make sure you discuss any questions you have with your health care provider.  Document Released: 07/09/2013 Document Revised: 05/26/2016 Document Reviewed: 05/26/2016  Elsevier Interactive Patient Education © 2019 Elsevier Inc.

## 2018-03-11 ENCOUNTER — Inpatient Hospital Stay (HOSPITAL_COMMUNITY)
Admission: AD | Admit: 2018-03-11 | Discharge: 2018-03-11 | Disposition: A | Discharge status: Home / Self Care | Payer: BLUE CROSS/BLUE SHIELD | Source: Ambulatory Visit | Attending: Obstetrics & Gynecology | Admitting: Obstetrics & Gynecology

## 2018-03-11 ENCOUNTER — Encounter (HOSPITAL_COMMUNITY): Payer: Self-pay | Admitting: *Deleted

## 2018-03-11 DIAGNOSIS — O039 Complete or unspecified spontaneous abortion without complication: Secondary | ICD-10-CM | POA: Insufficient documentation

## 2018-03-11 LAB — HCG, QUANTITATIVE, PREGNANCY: hCG, Beta Chain, Quant, S: 1139 m[IU]/mL — ABNORMAL HIGH (ref <5)

## 2018-03-11 NOTE — MAU Provider Note (Signed)
Ms. Adiline Tokunaga  is a 33 y.o. G3P2002 at Unknown who presents to MAU today for follow-up quant hCG after 48 hours. The patient was seen in MAU on 03/09/18 and had quant hCG of 10,070 and US showed no IUP or adnexal mass. She denies pain, vaginal bleeding or fever today.   OB History  Gravida Para Term Preterm AB Living  3 2 2     2   SAB TAB Ectopic Multiple Live Births        0 2    # Outcome Date GA Lbr Len/2nd Weight Sex Delivery Anes PTL Lv  3 Current           2 Term 12/30/16 [redacted]w[redacted]d  3035 g M CS-LTranv EPI  LIV     Birth Comments: wnl  1 Term 09/10/15 [redacted]w[redacted]d  2700 g M CS-LTranv   LIV     Complications: Delivery by elective cesarean section    Obstetric Comments  G1: in [place]. Pt states was elective due to female circ.     Past Medical History:  Diagnosis Date  . Anemia    last pregnancy  . Female circumcision     ROS: no VB no pain  BP 95/71 (BP Location: Right Arm)   Pulse 83   Temp 97.9 F (36.6 C) (Oral)   Resp 16   Wt 55.4 kg   LMP  (LMP Unknown)   SpO2 97% Comment: ra  BMI 23.07 kg/m   CONSTITUTIONAL: Well-developed, well-nourished female in no acute distress.  MUSCULOSKELETAL: Normal range of motion.  CARDIOVASCULAR: Regular heart rate RESPIRATORY: Normal effort NEUROLOGICAL: Alert and oriented to person, place, and time.  SKIN: Not diaphoretic. No erythema. No pallor. PSYCH: Normal mood and affect. Normal behavior. Normal judgment and thought content.  Results for orders placed or performed during the hospital encounter of 03/11/18 (from the past 24 hour(s))  hCG, quantitative, pregnancy     Status: Abnormal   Collection Time: 03/11/18 10:16 AM  Result Value Ref Range   hCG, Beta Chain, Quant, S 1,139 (H) <5 mIU/mL    MDM: Sharp decline in [facility] indicates SAB. Will follow weekly until neg. Discussed with pt and spouse via interpreter. Stable for discharge home.  A: SAB Rh pos  P: Discharge home Return precautions discussed Patient will  return for follow-up quant HCG in [facility] on 03/17/18 @0900  Patient may return to MAU as needed or if her condition were to change or worsen   Video interpreter used for encounter Donette Larry, CNM 03/11/2018 11:07 AM

## 2018-03-11 NOTE — MAU Note (Signed)
Kelsey Ramirez is a 33 y.o.  here in MAU reporting: for follow HCG labs from MAU visit 2 days ago. Vaginal bleeding: reports still the same as before. Light period in nature. Pain score: 3/10. Lower abdominal pain. Cramping. Intermittent. Less than before she says. Vitals:   03/11/18 0953  BP: 95/71  Pulse: 83  Resp: 16  Temp: 97.9 F (36.6 C)  SpO2: 97%      Patient declines hospital interpretor. Desires to utilize husband at this time. She is trying to contact another family/friend for interpretation as well. Patient to lobby pending lab results.

## 2018-03-17 ENCOUNTER — Ambulatory Visit (INDEPENDENT_AMBULATORY_CARE_PROVIDER_SITE_OTHER): Payer: BLUE CROSS/BLUE SHIELD | Admitting: *Deleted

## 2018-03-17 DIAGNOSIS — O039 Complete or unspecified spontaneous abortion without complication: Secondary | ICD-10-CM

## 2018-03-17 NOTE — Progress Notes (Signed)
Agree with A & P. 

## 2018-03-17 NOTE — Progress Notes (Signed)
Here for repeat bhcg . Per last visit 03/11/18 in MAU presumed miscarriage due to sharp decline in bhcg. Denies pain and states only spotting a little. Explained will get weekly bhcg until returns to normal and will see provider in 2 weeks. Ectopic precautions given.

## 2018-03-18 LAB — BETA HCG QUANT (REF LAB): hCG Quant: 57 m[IU]/mL

## 2018-03-21 ENCOUNTER — Telehealth: Payer: Self-pay

## 2018-03-21 NOTE — Telephone Encounter (Signed)
-----   Message from Hermina Staggers, MD sent at 03/20/2018  3:38 PM EST ----- Please let pt know that her BHCG is continues to decrease, indicative of miscarriage Repeat BHCG in 1 week Thanks Casimiro Needle

## 2018-03-21 NOTE — Telephone Encounter (Signed)
With [facility] Interpreter # [phone] informed pt that her pregnancy level is decreasing and we need to follow it to zero.  I asked pt if she would be able to come in on 1/20 for a rpt beta level.  Pt stated that she does not drive and would not be able to come until next Thursday around 3pm.  I explained to the pt if that is only time she has available we will put her on the schedule for that time.  Pt stated thank you with no further questions.

## 2018-04-12 ENCOUNTER — Ambulatory Visit: Payer: BLUE CROSS/BLUE SHIELD | Admitting: Nurse Practitioner

## 2018-04-27 ENCOUNTER — Encounter: Payer: Self-pay | Admitting: Obstetrics and Gynecology

## 2018-04-27 ENCOUNTER — Ambulatory Visit (INDEPENDENT_AMBULATORY_CARE_PROVIDER_SITE_OTHER): Payer: BLUE CROSS/BLUE SHIELD | Admitting: Obstetrics and Gynecology

## 2018-04-27 ENCOUNTER — Other Ambulatory Visit: Payer: Self-pay | Admitting: Obstetrics and Gynecology

## 2018-04-27 ENCOUNTER — Telehealth: Payer: Self-pay | Admitting: Obstetrics & Gynecology

## 2018-04-27 VITALS — BP 93/57 | HR 93 | Wt 123.4 lb

## 2018-04-27 DIAGNOSIS — O039 Complete or unspecified spontaneous abortion without complication: Secondary | ICD-10-CM

## 2018-04-27 NOTE — Telephone Encounter (Signed)
Called to verify if the patient is attending her visit today.

## 2018-04-27 NOTE — Progress Notes (Signed)
History   Chief Complaint:  Follow-up (Miscarriage)   Kelsey Ramirez is  33 y.o. [id number] No LMP recorded (lmp unknown). Patient is pregnant.Patient is here for follow up of quantitative HCG and ongoing surveillance of pregnancy status.   She denies pain or bleeding at this time. She thinks she started per period on Feb 1 and stopped on Feb 9  Since her last visit, the patient is without new complaint.   The patient reports bleeding as  none now.   General ROS:  negative  Her previous Quantitative HCG values are:   Physical Exam  Blood pressure (!) 93/57, pulse 93, weight 123 lb 6.4 oz (56 kg), unknown if currently breastfeeding.   Labs:  Quant 1/02: 10,070 Quant  1/4:   1,139 Quant  1/10:  57   Ultrasound Studies:   No results found.  Assessment:  On- going surveillance of SAB Quant pending   Plan: The patient is instructed to follow up in one week for Roberto Scales I, NP 04/30/2018 11:21 AM

## 2018-04-28 LAB — BETA HCG QUANT (REF LAB): hCG Quant: <1 m[IU]/mL

## 2018-04-30 DIAGNOSIS — O039 Complete or unspecified spontaneous abortion without complication: Secondary | ICD-10-CM | POA: Insufficient documentation

## 2018-04-30 DIAGNOSIS — O09299 Supervision of pregnancy with other poor reproductive or obstetric history, unspecified trimester: Secondary | ICD-10-CM | POA: Insufficient documentation

## 2018-05-29 ENCOUNTER — Telehealth: Payer: Self-pay | Admitting: *Deleted

## 2018-05-29 NOTE — Telephone Encounter (Signed)
A female left a message 05/26/18 pm after office closed that was difficult to understand due to accent but requested results.

## 2018-05-29 NOTE — Telephone Encounter (Addendum)
I called Kelsey Ramirez home number  with [employer] [phone] and left a message I am returning your call- please call our office. I called Kelsey Ramirez mobile number with same interpreter and was unable to leave a message- received message person unavailable-please call again.

## 2018-06-02 NOTE — Telephone Encounter (Signed)
Called patient with interpreter # [phone] states mailbox is full and unable to LVM .

## 2018-07-20 ENCOUNTER — Encounter: Payer: Self-pay | Admitting: *Deleted

## 2018-09-04 IMAGING — US US MFM FETAL NUCHAL TRANSLUCENCY
1 series · 15 of 28 positions shown · non-contrast
Comparison: none

[Series 1: us mfm fetal nuchal translucency · 15 of 45 slices shown]
[im 1/45]
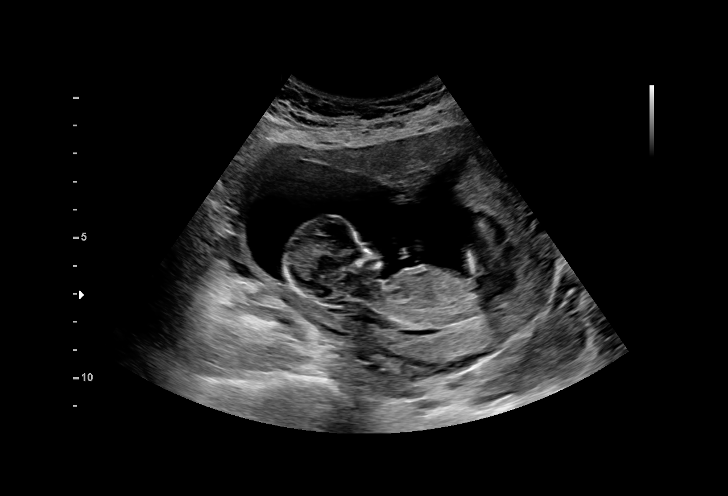
[im 4/45]
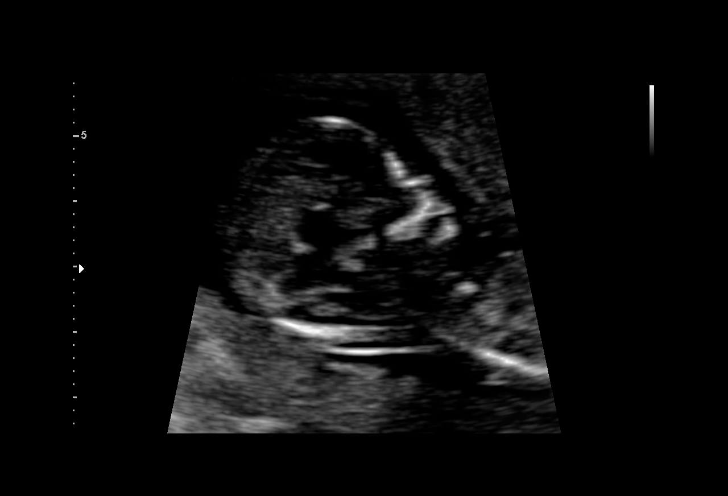
[im 7/45]
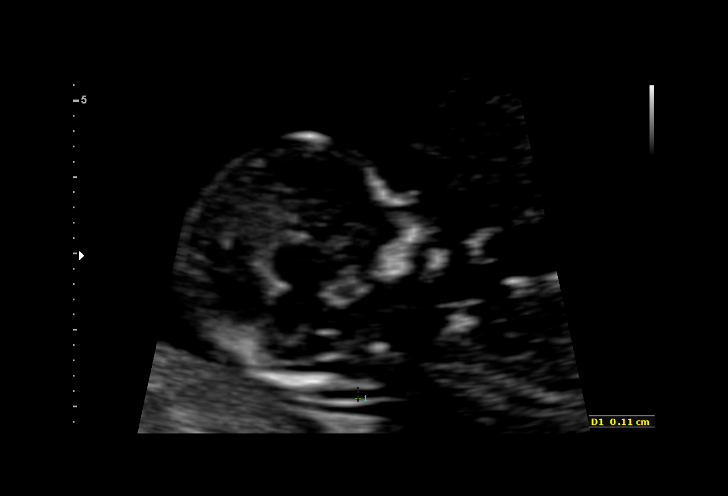
[im 10/45]
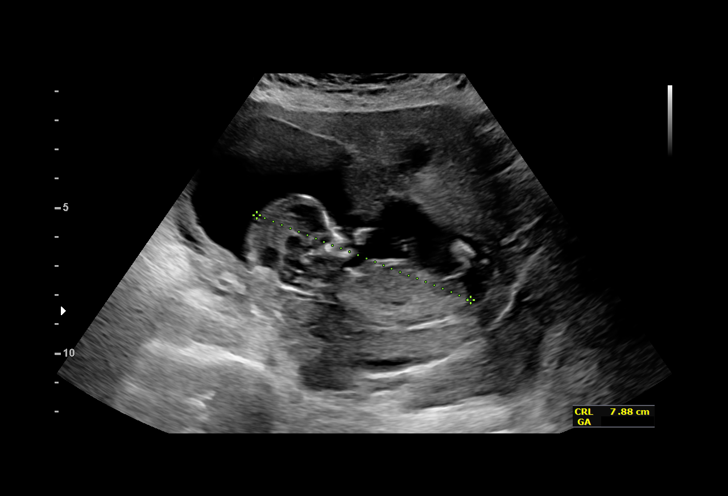
[im 14/45]
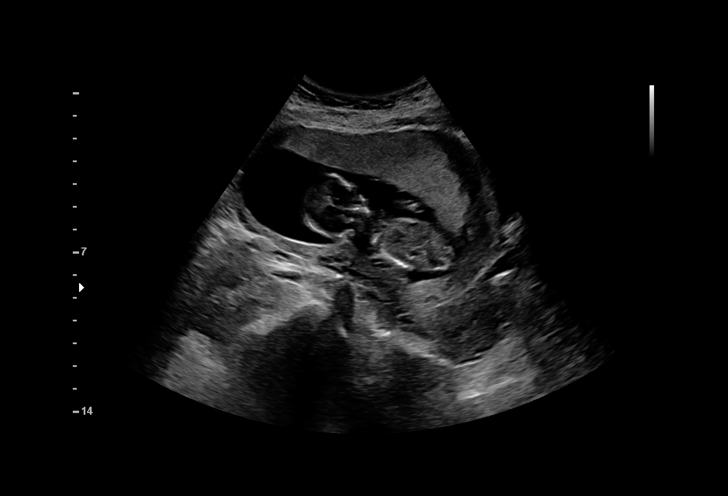
[im 17/45]
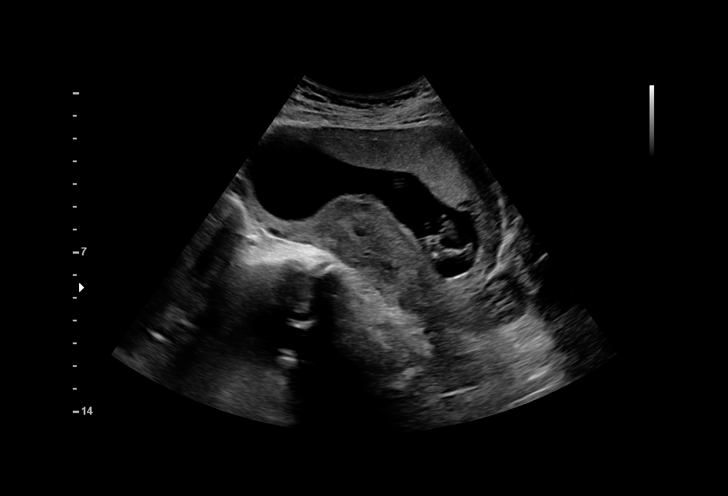
[im 20/45]
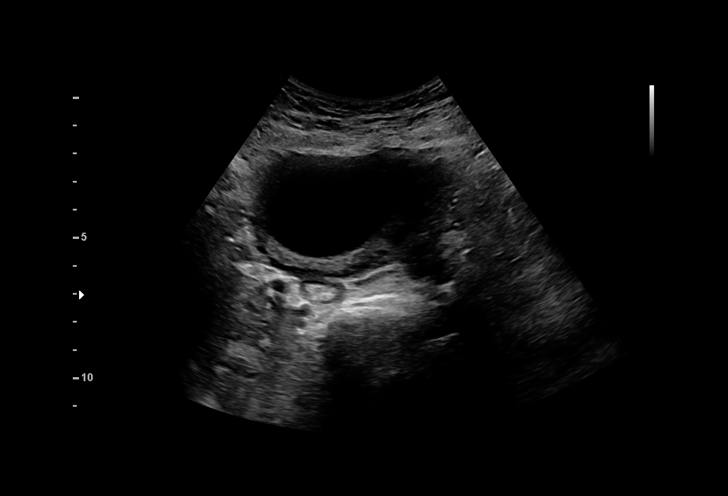
[im 23/45]
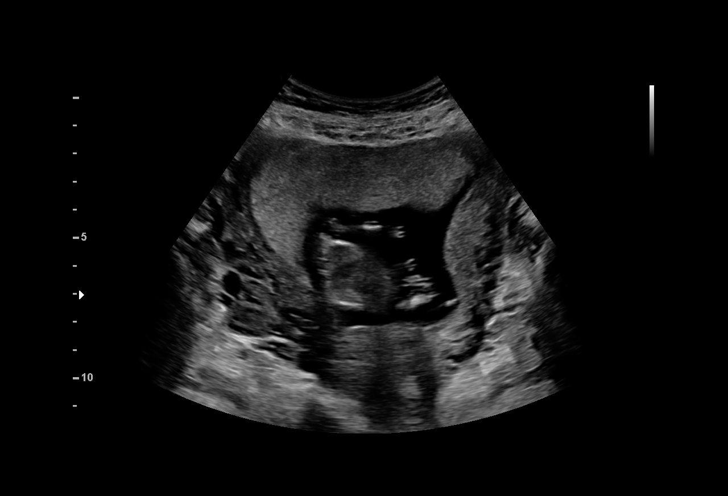
[im 25/45]
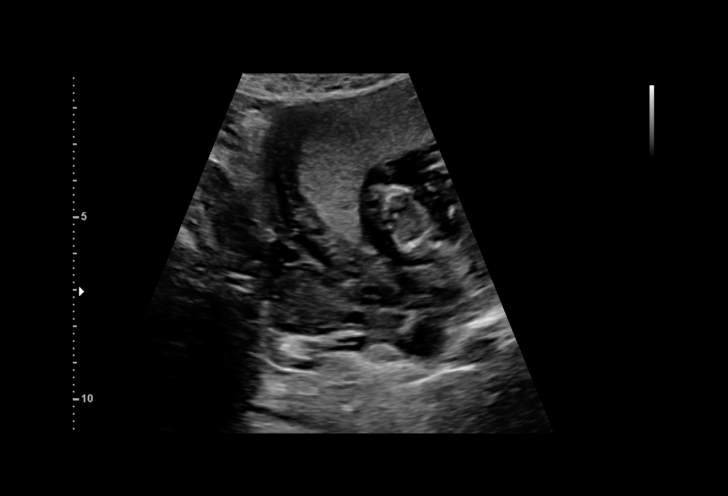
[im 28/45]
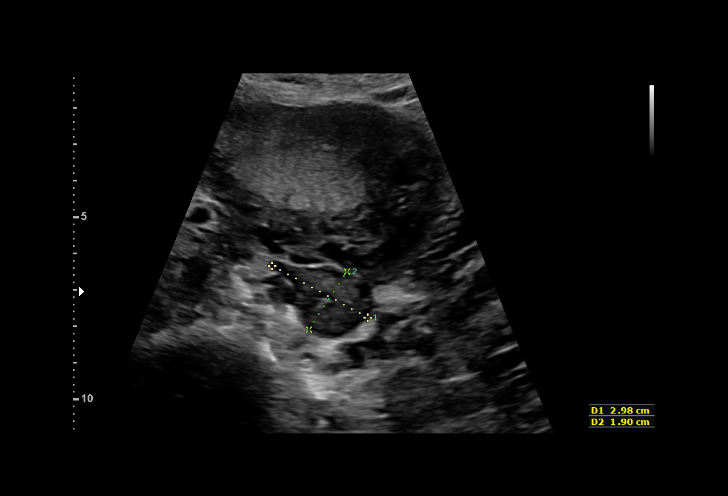
[im 31/45]
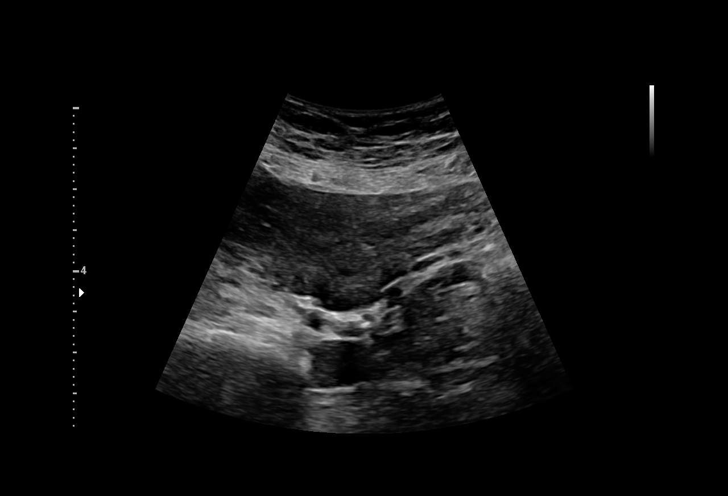
[im 35/45]
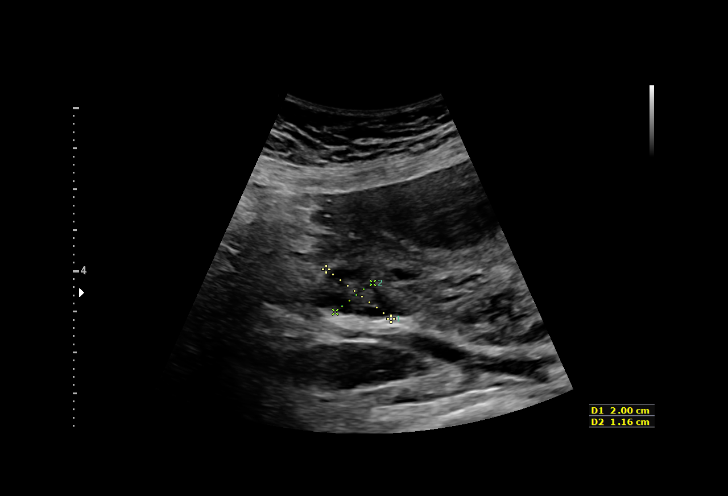
[im 38/45]
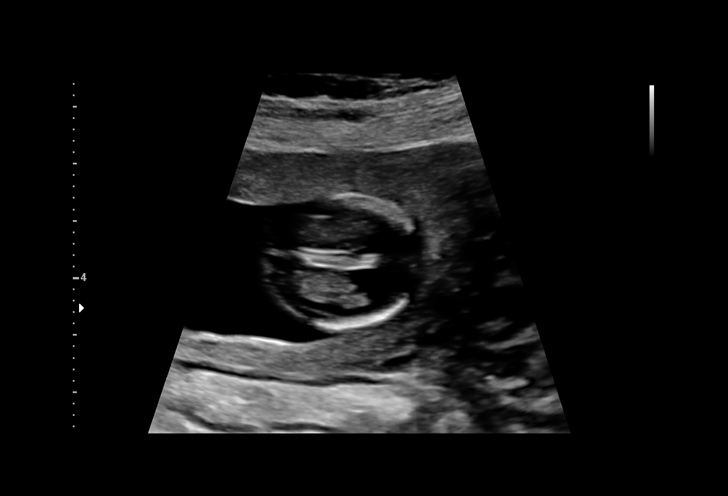
[im 41/45]
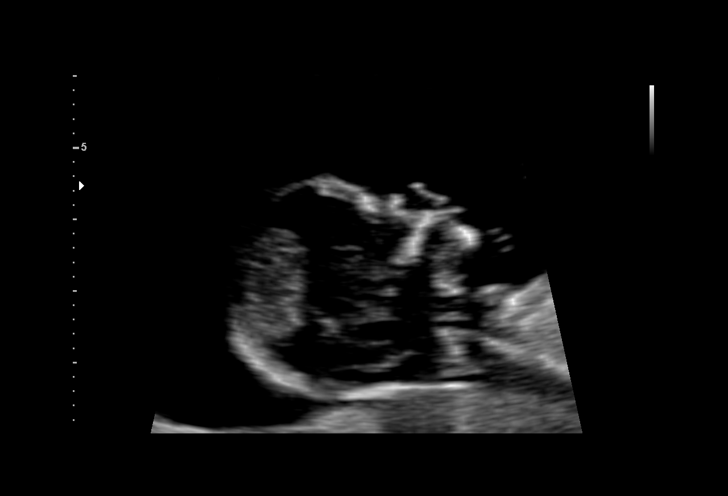
[im 45/45]
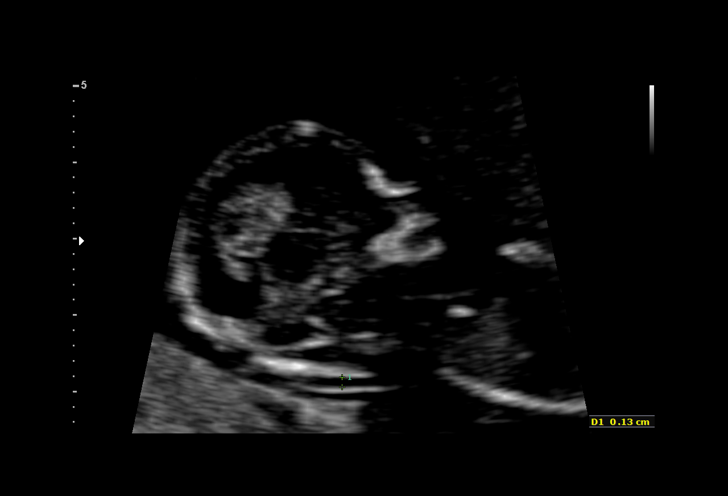

[15 of 28 positions shown; findings below may reference images not displayed]

TRANSLUCENCY

1  NIVIRUS DATABEX           [id number]      [id number]     [id number]
Indications

13 weeks gestation of pregnancy
Encounter for nuchal translucency
OB History

Gravidity:    2         Term:   1        Prem:   0        SAB:   0
TOP:          0       Ectopic:  0        Living: 1
Fetal Evaluation

Num Of Fetuses:     1
Fetal Heart         154
Rate(bpm):
Cardiac Activity:   Observed
Placenta:           Anterior, above cervical os

Amniotic Fluid
AFI FV:      Subjectively within normal limits
Biometry

CRL:      78.3  mm     G. Age:  13w 4d                  EDD:   01/03/17
Gestational Age

LMP:           13w 2d       Date:   03/31/16                 EDD:   01/05/17
Best:          13w 2d    Det. By:   LMP  (03/31/16)          EDD:   01/05/17
1st Trimester Genetic Sonogram Screening
CRL:            78.3  mm    G. Age:   13w 4d                 EDD:   01/03/17
Nuc Trans:       1.3  mm

Nasal Bone:                 Present
Cervix Uterus Adnexa

Cervix
Appears closed, without funnelling.

Uterus
Single fibroid noted, see table below.

Left Ovary
Within normal limits.

Right Ovary
Within normal limits.

Adnexa:       No abnormality visualized.
Myomas

Site                     L(cm)      W(cm)      D(cm)      Location
Left

Blood Flow                 RI        PI       Comments

Impression

Single living intrauterine pregnancy at 61w3d.
CRL consistent with EDD based on LMP.
Pregnancy should be dated by LMP.
NT 1.3mm. Nasal bone present.
Recommendations

First trimester screen performed.
Follow-up ultrasound for complete anatomic survey at 18-20
weeks.

## 2020-03-14 ENCOUNTER — Encounter: Payer: Self-pay | Admitting: Registered Nurse

## 2020-03-14 ENCOUNTER — Ambulatory Visit (INDEPENDENT_AMBULATORY_CARE_PROVIDER_SITE_OTHER): Payer: 59 | Admitting: Registered Nurse

## 2020-03-14 ENCOUNTER — Other Ambulatory Visit: Payer: Self-pay

## 2020-03-14 VITALS — BP 103/72 | HR 87 | Temp 97.9°F | Ht 59.0 in | Wt 117.9 lb

## 2020-03-14 DIAGNOSIS — N920 Excessive and frequent menstruation with regular cycle: Secondary | ICD-10-CM

## 2020-03-14 DIAGNOSIS — Z1329 Encounter for screening for other suspected endocrine disorder: Secondary | ICD-10-CM

## 2020-03-14 DIAGNOSIS — Z7689 Persons encountering health services in other specified circumstances: Secondary | ICD-10-CM

## 2020-03-14 DIAGNOSIS — Z13228 Encounter for screening for other metabolic disorders: Secondary | ICD-10-CM

## 2020-03-14 DIAGNOSIS — Z13220 Encounter for screening for lipoid disorders: Secondary | ICD-10-CM

## 2020-03-14 DIAGNOSIS — Z130 Encounter for screening for diseases of the blood and blood-forming organs and certain disorders involving the immune mechanism: Secondary | ICD-10-CM

## 2020-03-14 NOTE — Patient Instructions (Signed)
° ° ° °  If you have lab work done today you will be contacted with your lab results within the next 2 weeks.  If you have not heard from us then please contact us. The fastest way to get your results is to register for My Chart. ° ° °IF you received an x-ray today, you will receive an invoice from [facility]. Please contact [facility] at [phone] with questions or concerns regarding your invoice.  ° °IF you received labwork today, you will receive an invoice from [facility]. Please contact [facility] at [phone] with questions or concerns regarding your invoice.  ° °Our billing staff will not be able to assist you with questions regarding bills from these companies. ° °You will be contacted with the lab results as soon as they are available. The fastest way to get your results is to activate your My Chart account. Instructions are located on the last page of this paperwork. If you have not heard from us regarding the results in 2 weeks, please contact this office. °  ° ° ° °

## 2020-03-15 LAB — COMPREHENSIVE METABOLIC PANEL
ALT: 8 IU/L (ref 0–32)
Globulin, Total: 2.6 g/dL (ref 1.5–4.5)
Potassium: 4.4 mmol/L (ref 3.5–5.2)
Total Protein: 7.1 g/dL (ref 6.0–8.5)

## 2020-03-15 LAB — HCG, SERUM, QUALITATIVE: hCG,Beta Subunit,Qual,Serum: NEGATIVE m[IU]/mL (ref <6)

## 2020-03-15 LAB — LIPID PANEL: Triglycerides: 80 mg/dL (ref 0–149)

## 2020-03-20 LAB — CBC WITH DIFFERENTIAL
Basophils Absolute: 0.1 10*3/uL (ref 0.0–0.2)
Basos: 1 %
EOS (ABSOLUTE): 0.6 10*3/uL — ABNORMAL HIGH (ref 0.0–0.4)
Eos: 6 %
Hematocrit: 35.7 % (ref 34.0–46.6)
Hemoglobin: 11.2 g/dL (ref 11.1–15.9)
Immature Grans (Abs): 0 10*3/uL (ref 0.0–0.1)
Immature Granulocytes: 0 %
Lymphocytes Absolute: 3.3 10*3/uL — ABNORMAL HIGH (ref 0.7–3.1)
Lymphs: 36 %
MCH: 25.2 pg — ABNORMAL LOW (ref 26.6–33.0)
MCHC: 31.4 g/dL — ABNORMAL LOW (ref 31.5–35.7)
MCV: 80 fL (ref 79–97)
Monocytes Absolute: 0.7 10*3/uL (ref 0.1–0.9)
Monocytes: 8 %
Neutrophils Absolute: 4.4 10*3/uL (ref 1.4–7.0)
Neutrophils: 49 %
RBC: 4.45 x10E6/uL (ref 3.77–5.28)
RDW: 14.2 % (ref 11.7–15.4)
WBC: 9.1 10*3/uL (ref 3.4–10.8)

## 2020-03-20 LAB — COMPREHENSIVE METABOLIC PANEL
AST: 11 IU/L (ref 0–40)
Albumin/Globulin Ratio: 1.7 (ref 1.2–2.2)
Albumin: 4.5 g/dL (ref 3.8–4.8)
Alkaline Phosphatase: 56 IU/L (ref 44–121)
BUN/Creatinine Ratio: 9 (ref 9–23)
BUN: 5 mg/dL — ABNORMAL LOW (ref 6–20)
Bilirubin Total: 0.2 mg/dL (ref 0.0–1.2)
CO2: 20 mmol/L (ref 20–29)
Calcium: 9.5 mg/dL (ref 8.7–10.2)
Chloride: 103 mmol/L (ref 96–106)
Creatinine, Ser: 0.53 mg/dL — ABNORMAL LOW (ref 0.57–1.00)
GFR calc Af Amer: 141 mL/min/{1.73_m2} (ref >59)
GFR calc non Af Amer: 123 mL/min/{1.73_m2} (ref >59)
Glucose: 92 mg/dL (ref 65–99)
Sodium: 139 mmol/L (ref 134–144)

## 2020-03-20 LAB — ESTROGENS, TOTAL: Estrogen: 119 pg/mL

## 2020-03-20 LAB — LIPID PANEL
Chol/HDL Ratio: 2.8 ratio (ref 0.0–4.4)
Cholesterol, Total: 172 mg/dL (ref 100–199)
HDL: 62 mg/dL (ref >39)
LDL Chol Calc (NIH): 95 mg/dL (ref 0–99)
VLDL Cholesterol Cal: 15 mg/dL (ref 5–40)

## 2020-03-20 LAB — TSH: TSH: 1.23 u[IU]/mL (ref 0.450–4.500)

## 2020-03-20 LAB — HEMOGLOBIN A1C
Est. average glucose Bld gHb Est-mCnc: 111 mg/dL
Hgb A1c MFr Bld: 5.5 % (ref 4.8–5.6)

## 2020-04-03 ENCOUNTER — Ambulatory Visit
Admission: RE | Admit: 2020-04-03 | Discharge: 2020-04-03 | Disposition: A | Discharge status: Home / Self Care | Payer: 59 | Source: Ambulatory Visit | Attending: Registered Nurse | Admitting: Registered Nurse

## 2020-04-03 ENCOUNTER — Other Ambulatory Visit: Payer: Self-pay | Admitting: Registered Nurse

## 2020-04-03 DIAGNOSIS — N920 Excessive and frequent menstruation with regular cycle: Secondary | ICD-10-CM

## 2020-04-15 ENCOUNTER — Encounter: Payer: Self-pay | Admitting: Registered Nurse

## 2020-04-15 NOTE — Progress Notes (Signed)
New Patient Office Visit  Subjective:  Patient ID: Kelsey Ramirez, female    DOB: 1985-09-11  Age: 35 y.o. MRN: [id number]  CC:  Chief Complaint  Patient presents with  . Establish Care  . Abdominal Pain    Pain on lower abd area for 6 month. Pain happens around menstrual cycle. Patient had a miscarriage in 2020 and she was not given any med to clean out her body after miscarriage. They told her the body will clean itself out. She has been having pain ever since but worse in the last 6 month. Patient's husband stated she have not been seen by ob gyn since miscarriage    HPI Kelsey Ramirez presents for visit to est care. Histories reviewed, updated as warranted  Notes lower abdominal pain for 6 mo Happens around menses. Suprapubic and bilateral. Had a miscarriage in 2020, was not imaged afterwards to ensure complete evacuation of uterus. Has been having pain but no fevers, chills, sweats, or other extra-GU symptoms Does note her menses have been heavier, cramps have been worse, and more clots seem to pass.  Otherwise no acute concerns at this time.   Past Medical History:  Diagnosis Date  . Anemia    last pregnancy  . Female circumcision     Past Surgical History:  Procedure Laterality Date  . CESAREAN SECTION    . CESAREAN SECTION N/A 12/30/2016   Procedure: REPEAT CESAREAN SECTION;  Surgeon: Tilda Burrow, MD;  Location: [facility] BIRTHING SUITES;  Service: Obstetrics;  Laterality: N/A;    History reviewed. No pertinent family history.  Social History   Socioeconomic History  . Marital status: Married    Spouse name: Not on file  . Number of children: 2  . Years of education: Not on file  . Highest education level: Not on file  Occupational History  . Not on file  Tobacco Use  . Smoking status: Never Smoker  . Smokeless tobacco: Never Used  Vaping Use  . Vaping Use: Never used  Substance and Sexual Activity  . Alcohol use: No  . Drug use: No  . Sexual activity: Yes     Birth control/protection: None  Other Topics Concern  . Not on file  Social History Narrative  . Not on file   Social Determinants of Health   Financial Resource Strain: Not on file  Food Insecurity: Not on file  Transportation Needs: Not on file  Physical Activity: Not on file  Stress: Not on file  Social Connections: Not on file  Intimate Partner Violence: Not on file    ROS Review of Systems  Constitutional: Negative.   HENT: Negative.   Eyes: Negative.   Respiratory: Negative.   Cardiovascular: Negative.   Gastrointestinal: Negative.   Endocrine: Negative.   Genitourinary: Positive for menstrual problem, pelvic pain and vaginal bleeding. Negative for difficulty urinating, dyspareunia, dysuria, enuresis, flank pain, frequency, genital sores, hematuria, urgency, vaginal discharge and vaginal pain.  Musculoskeletal: Negative.   Skin: Negative.   Allergic/Immunologic: Negative.   Neurological: Negative.   Psychiatric/Behavioral: Negative.   All other systems reviewed and are negative.   Objective:   Today's Vitals: BP 103/72 (BP Location: Right Arm, Patient Position: Sitting, Cuff Size: Normal)   Pulse 87   Temp 97.9 F (36.6 C) (Temporal)   Ht 4\' 11"  (1.499 m)   Wt 117 lb 14.4 oz (53.5 kg)   SpO2 100%   BMI 23.81 kg/m   Physical Exam Vitals and nursing note reviewed.  Constitutional:      General: She is not in acute distress.    Appearance: Normal appearance. She is not ill-appearing, toxic-appearing or diaphoretic.  Cardiovascular:     Rate and Rhythm: Normal rate and regular rhythm.     Pulses: Normal pulses.     Heart sounds: Normal heart sounds. No murmur heard. No friction rub. No gallop.   Pulmonary:     Effort: Pulmonary effort is normal. No respiratory distress.     Breath sounds: Normal breath sounds. No stridor. No wheezing, rhonchi or rales.  Chest:     Chest wall: No tenderness.  Abdominal:     General: Bowel sounds are normal.      Tenderness: There is abdominal tenderness in the suprapubic area. There is no right CVA tenderness, left CVA tenderness or rebound. Negative signs include Murphy's sign and McBurney's sign.     Hernia: No hernia is present.  Skin:    General: Skin is warm and dry.     Capillary Refill: Capillary refill takes less than 2 seconds.  Neurological:     General: No focal deficit present.     Mental Status: She is alert and oriented to person, place, and time. Mental status is at baseline.  Psychiatric:        Mood and Affect: Mood normal.        Behavior: Behavior normal.        Thought Content: Thought content normal.        Judgment: Judgment normal.     Assessment & Plan:   Problem List Items Addressed This Visit   None   Visit Diagnoses    Menorrhagia with regular cycle    -  Primary   Relevant Orders   Ambulatory referral to Obstetrics / Gynecology   TSH (Completed)   Estrogens, Total (Completed)   hCG, serum, qualitative (Completed)   Screening for endocrine, metabolic and immunity disorder       Relevant Orders   CBC With Differential (Completed)   Comprehensive metabolic panel (Completed)   Hemoglobin A1c (Completed)   Lipid screening       Relevant Orders   Lipid panel (Completed)      Outpatient Encounter Medications as of 03/14/2020  Medication Sig  . [DISCONTINUED] folic acid (FOLVITE) 1 MG tablet Take 1 mg by mouth daily.  . [DISCONTINUED] Prenatal Vit-Fe Fumarate-FA (PREPLUS) 27-1 MG TABS Take 1 tablet by mouth daily. (Patient not taking: Reported on 04/27/2018)   No facility-administered encounter medications on file as of 03/14/2020.    Follow-up: No follow-ups on file.   PLAN  Suspect incomplete miscarriage. Will order labs and referral.  Labs collected. Will follow up with the patient as warranted.  Suggest CPE at some point in 2022   ER precautions reviewed  Patient encouraged to call clinic with any questions, comments, or concerns.  Janeece Agee, NP

## 2020-05-26 ENCOUNTER — Ambulatory Visit: Payer: 59 | Admitting: Family Medicine

## 2020-05-29 ENCOUNTER — Ambulatory Visit: Payer: 59 | Admitting: Obstetrics and Gynecology

## 2020-06-02 ENCOUNTER — Encounter: Payer: Self-pay | Admitting: Medical

## 2020-06-03 ENCOUNTER — Ambulatory Visit: Payer: Self-pay | Admitting: Medical

## 2020-06-19 ENCOUNTER — Other Ambulatory Visit: Payer: Self-pay

## 2020-06-19 ENCOUNTER — Encounter: Payer: Self-pay | Admitting: Student

## 2020-06-19 ENCOUNTER — Ambulatory Visit (INDEPENDENT_AMBULATORY_CARE_PROVIDER_SITE_OTHER): Payer: 59 | Admitting: Student

## 2020-06-19 VITALS — BP 98/69 | HR 77 | Wt 117.0 lb

## 2020-06-19 DIAGNOSIS — N920 Excessive and frequent menstruation with regular cycle: Secondary | ICD-10-CM | POA: Diagnosis not present

## 2020-06-19 HISTORY — DX: Excessive and frequent menstruation with regular cycle: N92.0

## 2020-06-19 NOTE — Progress Notes (Signed)
Patient declined Pap Smear.

## 2020-06-19 NOTE — Progress Notes (Signed)
Patient ID: Kelsey Ramirez, female   DOB: [birth year], 35 y.o.   MRN: [id number]  History:  Kelsey Ramirez is a 35 y.o. [id number] who presents to clinic today for concern for heavy bleeding during her period. SHe sometimes has clots and heavy bleeding, and sometimes not. She also has noticed that since her miscarriage she has had longer periods; lasting 9-10 days. She also has pain that comes and goes. Sometimes it is before her period, sometimes after, and sometimes during. She has not tried anything for the pain. She does not want to take any medicine. She wants to know if her [place] was ok from January.   The following portions of the patient's history were reviewed and updated as appropriate: allergies, current medications, family history, past medical history, social history, past surgical history and problem list.  Review of Systems:  Review of Systems  Constitutional: Negative.   HENT: Negative.   Eyes: Negative.   Cardiovascular: Negative.   Skin: Negative.       Objective:  Physical Exam BP 98/69   Pulse 77   Wt 117 lb (53.1 kg)   LMP 06/11/2020   Breastfeeding Unknown   BMI 23.63 kg/m  Physical Exam Constitutional:      Appearance: Normal appearance.  Neurological:     Mental Status: She is alert.       Labs and Imaging No results found for this or any previous visit (from the past 24 hour(s)).  No results found.   Assessment & Plan:    1. Menorrhagia with regular cycle    -Reviewed [place] results which showed that patient had normal anatomy and no concerning findings -Patient desires another [place]; explained that we do  Not normally do [place] again within 4 months. Patient declined birth control. Reassured patient again of US findings and offered blood work, which patient declined.   Upon checking out, patient's husband became upset. Front office staff will make appt with MD to discuss further work-up, possible endometrial biopsy.   Approximately 30 minutes of total time was  spent with this patient on counseling and coordination of care.   Marylene Land, CNM 06/19/2020 3:49 PM

## 2020-07-09 ENCOUNTER — Other Ambulatory Visit: Payer: Self-pay

## 2020-07-09 ENCOUNTER — Inpatient Hospital Stay (HOSPITAL_COMMUNITY)
Admission: AD | Admit: 2020-07-09 | Discharge: 2020-07-09 | Disposition: A | Discharge status: Home / Self Care | Payer: 59 | Attending: Obstetrics & Gynecology | Admitting: Obstetrics & Gynecology

## 2020-07-09 DIAGNOSIS — R1030 Lower abdominal pain, unspecified: Secondary | ICD-10-CM | POA: Diagnosis not present

## 2020-07-09 DIAGNOSIS — R110 Nausea: Secondary | ICD-10-CM | POA: Diagnosis not present

## 2020-07-09 DIAGNOSIS — N912 Amenorrhea, unspecified: Secondary | ICD-10-CM | POA: Diagnosis present

## 2020-07-09 DIAGNOSIS — Z3202 Encounter for pregnancy test, result negative: Secondary | ICD-10-CM | POA: Insufficient documentation

## 2020-07-09 DIAGNOSIS — R519 Headache, unspecified: Secondary | ICD-10-CM | POA: Insufficient documentation

## 2020-07-09 LAB — URINALYSIS, ROUTINE W REFLEX MICROSCOPIC
Bacteria, UA: NONE SEEN
Bilirubin Urine: NEGATIVE
Glucose, UA: NEGATIVE mg/dL
Ketones, ur: NEGATIVE mg/dL
Leukocytes,Ua: NEGATIVE
Nitrite: NEGATIVE
Protein, ur: NEGATIVE mg/dL
Specific Gravity, Urine: 1.023 (ref 1.005–1.030)
pH: 7 (ref 5.0–8.0)

## 2020-07-09 LAB — POCT PREGNANCY, URINE: Preg Test, Ur: NEGATIVE

## 2020-07-09 NOTE — MAU Note (Signed)
Wynelle Bourgeois CNM talked with pt regarding test results and d/c plan. Pt d/c home from Triage

## 2020-07-09 NOTE — MAU Provider Note (Signed)
Event Date/Time   First Provider Initiated Contact with Patient 07/09/20 2054       S [name] is a 35 y.o. [id number] patient who presents to MAU today with complaint of bleeding, nausea, headache, and being 1 day late for her period.  Patient prefers to use her husband (on phone) for interpretation.  Refuses hospital interpretor on Ipad.   RN Note: Fri night had intercourse and had bleeding the next morning. No bleeding since then. Today having pain in lower abd. Which is why she came tonight.. Has had h/a and nausea past 3 days. No headache now.  She was supposed to start her period yesterday. Has not done upt at home. No birth control  O BP 114/71   Pulse 82   Temp 98.2 F (36.8 C)   Resp 16   Ht 5' (1.524 m)   Wt 53.5 kg   LMP 06/11/2020   BMI 23.05 kg/m  Physical Exam Constitutional:      Appearance: Normal appearance.  HENT:     Head: Normocephalic.  Cardiovascular:     Rate and Rhythm: Normal rate.  Pulmonary:     Effort: Pulmonary effort is normal.  Neurological:     General: No focal deficit present.     Mental Status: She is alert.    Results for orders placed or performed during the hospital encounter of 07/09/20 (from the past 24 hour(s))  Urinalysis, Routine w reflex microscopic Urine, Clean Catch     Status: Abnormal   Collection Time: 07/09/20  8:48 PM  Result Value Ref Range   Color, Urine YELLOW YELLOW   APPearance CLEAR CLEAR   Specific Gravity, Urine 1.023 1.005 - 1.030   pH 7.0 5.0 - 8.0   Glucose, UA NEGATIVE NEGATIVE mg/dL   Hgb urine dipstick SMALL (A) NEGATIVE   Bilirubin Urine NEGATIVE NEGATIVE   Ketones, ur NEGATIVE NEGATIVE mg/dL   Protein, ur NEGATIVE NEGATIVE mg/dL   Nitrite NEGATIVE NEGATIVE   Leukocytes,Ua NEGATIVE NEGATIVE   RBC / HPF 0-5 0 - 5 RBC/hpf   WBC, UA 0-5 0 - 5 WBC/hpf   Bacteria, UA NONE SEEN NONE SEEN   Squamous Epithelial / LPF 0-5 0 - 5   Mucus PRESENT   Pregnancy, urine POC     Status: None   Collection  Time: 07/09/20  8:48 PM  Result Value Ref Range   Preg Test, Ur NEGATIVE NEGATIVE    A Medical screening exam complete NOT PREGNANT  P Discharge from MAU in stable condition Patient given the option of transfer to [facility] for further evaluation or seek care in outpatient facility of choice  Has appointment scheduled with Dr Alvester Morin on 07/31/20, reminded of that  Warning signs for worsening condition that would warrant emergency follow-up discussed Patient may return to MAU as needed   Aviva Signs, CNM 07/09/2020 8:54 PM

## 2020-07-09 NOTE — MAU Note (Addendum)
Fri night had intercourse and had bleeding the next morning. No bleeding since then. Today having pain in lower abd. Which is why she came tonight.. Has had h/a and nausea past 3 days. No headache now.  She was supposed to start her period yesterday. Has not done upt at home. No birth control.

## 2020-07-31 ENCOUNTER — Ambulatory Visit (INDEPENDENT_AMBULATORY_CARE_PROVIDER_SITE_OTHER): Payer: 59 | Admitting: Family Medicine

## 2020-07-31 ENCOUNTER — Other Ambulatory Visit: Payer: Self-pay

## 2020-07-31 ENCOUNTER — Encounter: Payer: Self-pay | Admitting: Family Medicine

## 2020-07-31 VITALS — BP 99/67 | HR 92 | Wt 114.0 lb

## 2020-07-31 DIAGNOSIS — Z3201 Encounter for pregnancy test, result positive: Secondary | ICD-10-CM

## 2020-07-31 DIAGNOSIS — Z3490 Encounter for supervision of normal pregnancy, unspecified, unspecified trimester: Secondary | ICD-10-CM | POA: Diagnosis not present

## 2020-07-31 LAB — POCT PREGNANCY, URINE: Preg Test, Ur: POSITIVE — AB

## 2020-07-31 MED ORDER — PREPLUS 27-1 MG PO TABS: 1.0000 | ORAL_TABLET | Freq: Every day | ORAL | 13 refills | Status: DC

## 2020-07-31 NOTE — Progress Notes (Signed)
   [facility] VISIT ENCOUNTER NOTE  Subjective:   Kelsey Ramirez is a 35 y.o. [id number] female here for a a problem GYN visit.  Current complaints: was here to discuss her menses but took home UPT that was positive and our today was also positive. She is excited about pregnancy.   Denies abnormal vaginal bleeding, discharge, pelvic pain, problems with intercourse or other gynecologic concerns.    Gynecologic History Patient's last menstrual period was 07/01/2020. Contraception: none  Health Maintenance Due  Topic Date Due  . PAP SMEAR-Modifier  Never done     The following portions of the patient's history were reviewed and updated as appropriate: allergies, current medications, past family history, past medical history, past social history, past surgical history and problem list.  Review of Systems Pertinent items are noted in HPI.   Objective:  BP 99/67   Pulse 92   Wt 114 lb (51.7 kg)   LMP 07/01/2020   BMI 22.26 kg/m  Gen: well appearing, NAD HEENT: no scleral icterus CV: RR Lung: Normal WOB Ext: warm well perfused   Assessment and Plan:  1. Early stage of pregnancy Reviewed very early stage of pregnancy but that she is indeed pregnant Discussed need for serial bhcg to help determine viability (prior miscarriages). Agrees to this plan If rising appropriately recommend initial prenatal visit at 8-10 weeks with [place] Recommended she start prenatal vitamin - Beta hCG quant (ref lab)   Please refer to After Visit Summary for other counseling recommendations.   Return in about 5 days (around 08/05/2020) for Lab only-- needs serial b-hcg (tues/Thurs).  Federico Flake, MD, MPH, ABFM Attending Physician Faculty Practice- [facility]

## 2020-08-01 LAB — BETA HCG QUANT (REF LAB): hCG Quant: 145 m[IU]/mL

## 2020-08-05 ENCOUNTER — Telehealth: Payer: Self-pay | Admitting: *Deleted

## 2020-08-05 NOTE — Telephone Encounter (Signed)
-----   Message from Federico Flake, MD sent at 08/02/2020  5:14 PM EDT ----- Blood pregnancy test positive!  She is scheduled to return next week for serial bhcg given history of miscarriage

## 2020-08-05 NOTE — Telephone Encounter (Signed)
I called Kelsey Ramirez  Home number with [facility] Interpreter [phone] and reached her voicemail. I left message calling with non urgent information , please call office back. I also called her mobile number and husband answered , he said [name] at home and to call home number. Interpreter and I called home number and again got voicemail. [name],RN

## 2020-08-06 ENCOUNTER — Other Ambulatory Visit: Payer: Self-pay

## 2020-08-06 ENCOUNTER — Other Ambulatory Visit: Payer: 59

## 2020-08-06 DIAGNOSIS — Z3490 Encounter for supervision of normal pregnancy, unspecified, unspecified trimester: Secondary | ICD-10-CM

## 2020-08-06 NOTE — Telephone Encounter (Signed)
Called pt with [facility] interpreter [place] [phone]; VM left stating I am calling with results and pt may return call, callback number given. Per chart review pt is scheduled for lab appt today for repeat beta HCG. Lab tech notified that nursing staff needs to speak with patient today.

## 2020-08-07 ENCOUNTER — Other Ambulatory Visit: Payer: 59

## 2020-08-07 ENCOUNTER — Telehealth: Payer: Self-pay

## 2020-08-07 ENCOUNTER — Other Ambulatory Visit: Payer: Self-pay | Admitting: *Deleted

## 2020-08-07 DIAGNOSIS — Z3490 Encounter for supervision of normal pregnancy, unspecified, unspecified trimester: Secondary | ICD-10-CM

## 2020-08-07 LAB — BETA HCG QUANT (REF LAB): hCG Quant: 2026 m[IU]/mL

## 2020-08-07 NOTE — Progress Notes (Signed)
RN to lab room to review beta HCG results from 07/31/20. Encouraged pt to answer the phone if able because we will be calling with more results. Pt reports ongoing diarrhea and constipation, otc med recommendation given.   Fleet Contras RN  08/07/20

## 2020-08-07 NOTE — Telephone Encounter (Addendum)
-----   Message from Federico Flake, MD sent at 08/07/2020  8:24 AM EDT ----- Rising beta HCG, awating value on 6/3 to assure appropriate 48 hr rise.   Called pt with [facility] interpreter ID [phone]. Result given. Pt would like to know when she will be seen by a doctor. I explained that pt will be seen around 10 weeks of pregnancy.

## 2020-08-08 ENCOUNTER — Other Ambulatory Visit: Payer: 59

## 2020-08-08 ENCOUNTER — Other Ambulatory Visit: Payer: Self-pay

## 2020-08-08 DIAGNOSIS — Z3490 Encounter for supervision of normal pregnancy, unspecified, unspecified trimester: Secondary | ICD-10-CM

## 2020-08-09 LAB — BETA HCG QUANT (REF LAB): hCG Quant: 4291 m[IU]/mL

## 2020-08-12 ENCOUNTER — Telehealth: Payer: Self-pay

## 2020-08-12 NOTE — Telephone Encounter (Addendum)
-----   Message from Federico Flake, MD sent at 08/10/2020  5:23 PM EDT ----- Pregnancy hormone has doubled appropriately in 48hrs. Recommend intake visit/initial prenatal for about 8wks. Please call her with the good news and give first trimester warning signs. I would recommend a dating [place] in the office at her IP.    Called pt with [facility] Interpreter # [phone] and informed pt that her beta results has risen and that we have scheduled her NEW OB intake appt with a nurse in person for 08/22/20 @ 0915.  Pt verbalized understanding.    Addison Naegeli, RN  08/12/20

## 2020-08-22 ENCOUNTER — Ambulatory Visit (INDEPENDENT_AMBULATORY_CARE_PROVIDER_SITE_OTHER): Payer: 59

## 2020-08-22 ENCOUNTER — Other Ambulatory Visit: Payer: Self-pay

## 2020-08-22 VITALS — BP 87/55 | HR 81 | Wt 114.3 lb

## 2020-08-22 DIAGNOSIS — O0990 Supervision of high risk pregnancy, unspecified, unspecified trimester: Secondary | ICD-10-CM

## 2020-08-22 DIAGNOSIS — Z136 Encounter for screening for cardiovascular disorders: Secondary | ICD-10-CM

## 2020-08-22 DIAGNOSIS — K5900 Constipation, unspecified: Secondary | ICD-10-CM

## 2020-08-22 DIAGNOSIS — T8859XA Other complications of anesthesia, initial encounter: Secondary | ICD-10-CM | POA: Insufficient documentation

## 2020-08-22 DIAGNOSIS — R112 Nausea with vomiting, unspecified: Secondary | ICD-10-CM

## 2020-08-22 MED ORDER — PROMETHAZINE HCL 25 MG PO TABS: 25.0000 mg | ORAL_TABLET | Freq: Four times a day (QID) | ORAL | 1 refills | Status: DC | PRN

## 2020-08-22 MED ORDER — SENNOSIDES-DOCUSATE SODIUM 8.6-50 MG PO TABS
1.0000 | ORAL_TABLET | Freq: Every day | ORAL | 3 refills | Status: DC
Start: 2020-08-22 — End: 2021-02-08

## 2020-08-22 MED ORDER — BLOOD PRESSURE MONITORING DEVI: 1.0000 | 0 refills | Status: DC

## 2020-08-22 MED ORDER — VITAMIN B-6 50 MG PO TABS: 50.0000 mg | ORAL_TABLET | Freq: Every day | ORAL | 3 refills | Status: DC

## 2020-08-22 NOTE — Addendum Note (Signed)
Addended by: Henrietta Dine on: 08/22/2020 12:06 PM   Modules accepted: Orders

## 2020-08-22 NOTE — Progress Notes (Signed)
Pt concerned of trace of swelling in ankles, Nausea and is wanting Prenatal vitamins without Gelatin for religious purposes. Does not want Iron in Vitamins due to constipation. Pt also concerned of Low BP.

## 2020-08-22 NOTE — Progress Notes (Signed)
New OB Intake  I connected with  Kelsey Ramirez on 08/22/20 at  9:15 AM EDT by MyChart Video Visit and verified that I am speaking with the correct person using two identifiers. Nurse is located at [facility] and pt is located at [facility] [place].  I discussed the limitations, risks, security and privacy concerns of performing an evaluation and management service by telephone and the availability of in person appointments. I also discussed with the patient that there may be a patient responsible charge related to this service. The patient expressed understanding and agreed to proceed.  I explained I am completing New OB Intake today. We discussed her EDD of 04/11/21 that is based on LMP of 07/05/20. Pt is G4/P2. I reviewed her allergies, medications, Medical/Surgical/OB history, and appropriate screenings. I informed her of [facility] services. Based on history, this is a/an  pregnancy complicated by PROM  .   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   MyChart/Babyscripts MyChart access verified. I explained pt will have some visits in office and some virtually. Babyscripts instructions given and order placed. Patient verifies receipt of registration text/e-mail. Account successfully created and app downloaded.  Blood Pressure Cuff  Blood pressure cuff ordered for patient to pick-up from [employer]. Explained after first prenatal appt pt will check weekly and document in Babyscripts.  Weight scale: Patient    have weight scale. Weight scale ordered   Anatomy  US Explained first scheduled [place] will be around 19 weeks. Anatomy US scheduled for 11/17/20 at 10:30. Pt notified to arrive at 10:15.  Labs Discussed Avelina Laine genetic screening with patient. Would like both Panorama and Horizon drawn at new OB visit. Routine prenatal labs needed.  Covid Vaccine Patient has not covid vaccine.   Mother/ Baby Dyad Candidate?    If yes, offer as possibility  Inform patient of [facility] and place . In AVS   Social Determinants of Health Food Insecurity: Patient denies food insecurity. WIC Referral: Patient is interested in referral to [facility].  Transportation: Patient denies transportation needs. Childcare: Discussed no children allowed at ultrasound appointments. Offered childcare services; patient declines childcare services at this time.  First visit review I reviewed new OB appt with pt. I explained she will have a pelvic exam, ob bloodwork with genetic screening, and PAP smear. Explained pt will be seen by Dr.Newton at first visit; encounter routed to appropriate provider. Explained that patient will be seen by pregnancy navigator following visit with provider. [facility] information placed in AVS.   Henrietta Dine, CMA 08/22/2020  9:28 AM

## 2020-09-09 NOTE — Progress Notes (Signed)
Attestation of Attending Supervision of clinical support staff: I agree with the care provided to this patient and was available for any consultation.  I have reviewed the CMA's note and chart, and I agree with the management and plan.  Kezia Benevides Niles Jesslynn Kruck, MD, MPH, ABFM Attending Physician Faculty Practice- [facility]  

## 2020-09-25 ENCOUNTER — Encounter: Payer: 59 | Admitting: Family Medicine

## 2020-10-08 ENCOUNTER — Encounter: Payer: 59 | Admitting: Certified Nurse Midwife

## 2020-10-10 ENCOUNTER — Encounter: Payer: Self-pay | Admitting: Obstetrics & Gynecology

## 2020-10-10 ENCOUNTER — Ambulatory Visit (INDEPENDENT_AMBULATORY_CARE_PROVIDER_SITE_OTHER): Payer: 59 | Admitting: Obstetrics & Gynecology

## 2020-10-10 ENCOUNTER — Other Ambulatory Visit: Payer: Self-pay

## 2020-10-10 VITALS — BP 102/70 | HR 118 | Wt 114.8 lb

## 2020-10-10 DIAGNOSIS — O09299 Supervision of pregnancy with other poor reproductive or obstetric history, unspecified trimester: Secondary | ICD-10-CM

## 2020-10-10 DIAGNOSIS — K0889 Other specified disorders of teeth and supporting structures: Secondary | ICD-10-CM

## 2020-10-10 DIAGNOSIS — O0990 Supervision of high risk pregnancy, unspecified, unspecified trimester: Secondary | ICD-10-CM

## 2020-10-10 DIAGNOSIS — O34219 Maternal care for unspecified type scar from previous cesarean delivery: Secondary | ICD-10-CM

## 2020-10-10 DIAGNOSIS — O09521 Supervision of elderly multigravida, first trimester: Secondary | ICD-10-CM

## 2020-10-10 DIAGNOSIS — Z3A13 13 weeks gestation of pregnancy: Secondary | ICD-10-CM

## 2020-10-10 MED ORDER — FOLIC ACID 1 MG PO TABS: 1.0000 mg | ORAL_TABLET | Freq: Every day | ORAL | 10 refills | Status: DC

## 2020-10-10 NOTE — Progress Notes (Signed)
History:   Nohely Whitehorn is a 35 y.o. [id number] at [redacted]w[redacted]d by LMP being seen today for her first obstetrical visit. Patient is Arabic-speaking only, interpreter present for this encounter.   Her obstetrical history is significant for advanced maternal age and previous cesarean sections x 2. Recently has early miscarriage in 07/2020 . Patient does intend to breast feed. Pregnancy history fully reviewed.  Patient reports no complaints.      HISTORY: OB History  Gravida Para Term Preterm AB Living  4 2 2  0 1 2  SAB IAB Ectopic Multiple Live Births  1 0 0 0 2    # Outcome Date GA Lbr Len/2nd Weight Sex Delivery Anes PTL Lv  4 Current           3 Term 12/30/16 [redacted]w[redacted]d  6 lb 11.1 oz (3.035 kg) M CS-LTranv EPI  LIV     Birth Comments: wnl     Name: Asbridge,BOY Alynn     Apgar1: 8  Apgar5: 9  2 Term 09/10/15 [redacted]w[redacted]d  5 lb 15.2 oz (2.7 kg) M CS-LTranv   LIV     Complications: Delivery by elective cesarean section  1 SAB             Obstetric Comments  G1: in [redacted]w[redacted]d. Pt states was elective due to female circ.   SHE HAS DECLINED ALL PAP SMEAR SCREENING!  Past Medical History:  Diagnosis Date   Anemia    last pregnancy   Complication of anesthesia    Feels Epidural caused BP to drop   Female circumcision    Menorrhagia 06/19/2020   Past Surgical History:  Procedure Laterality Date   CESAREAN SECTION     CESAREAN SECTION N/A 12/30/2016   Procedure: REPEAT CESAREAN SECTION;  Surgeon: 01/01/2017, MD;  [facility]: [facility] BIRTHING SUIT[facility];  Service: Obstetrics;  Laterality: N/A;   History reviewed. No pertinent family history. Social History   Tobacco Use   Smoking status: Never   Smokeless tobacco: Never  Vaping Use   Vaping Use: Never used  Substance Use Topics   Alcohol use: No   Drug use: No   No Active Allergies Current Outpatient Medications on File Prior to Visit  Medication Sig Dispense Refill   Blood Pressure Monitoring DEVI 1 each by Does not apply route once a week.  1 each 0   Prenatal Vit-Fe Fumarate-FA (PREPLUS) 27-1 MG TABS Take 1 tablet by mouth daily. 30 tablet 13   promethazine (PHENERGAN) 25 MG tablet Take 1 tablet (25 mg total) by mouth every 6 (six) hours as needed for nausea or vomiting. 30 tablet 1   senna-docusate (SENOKOT-S) 8.6-50 MG tablet Take 1 tablet by mouth daily. 30 tablet 3   pyridOXINE (VITAMIN B-6) 50 MG tablet Take 1 tablet (50 mg total) by mouth daily. (Patient not taking: Reported on 10/10/2020) 30 tablet 3   No current facility-administered medications on file prior to visit.    Review of Systems Pertinent items noted in HPI and remainder of comprehensive ROS otherwise negative.  Physical Exam:   Vitals:   10/10/20 0906  BP: 102/70  Pulse: (!) 118  Weight: 114 lb 12.8 oz (52.1 kg)   Fetal Heart Rate (bpm): 150 by doppler  General: well-developed, well-nourished female in no acute distress  Breasts:  normal appearance, no masses or tenderness bilaterally  Skin: normal coloration and turgor, no rashes  Neurologic: oriented, normal, negative, normal mood  Extremities: normal strength, tone, and muscle mass, ROM  of all joints is normal  HEENT PERRLA, extraocular movement intact and sclera clear, anicteric  Neck supple and no masses  Cardiovascular: regular rate and rhythm  Respiratory:  no respiratory distress, normal breath sounds  Abdomen: soft, non-tender; bowel sounds normal; no masses,  no organomegaly  Pelvic: deferred      Assessment:    Pregnancy: [id number] Patient Active Problem List   Diagnosis Date Noted   AMA (advanced maternal age) multigravida 35+, first trimester 10/10/2020   Complication of anesthesia 08/22/2020   Supervision of high risk pregnancy, antepartum 08/22/2020   History of miscarriage, currently pregnant 04/30/2018   Language barrier affecting health care 06/23/2016   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 Tooth pain Given  dentist resources letter - Ambulatory referral to Dentistry  2. Previous cesarean delivery, antepartum Will deliver by [facility]  3. History of miscarriage, currently pregnant 4. AMA (advanced maternal age) multigravida 35+, first trimester Desires early scan, declined genetic screening. - [place] MFM OB 11-14 WEEK ANATOMY; Future  5. [redacted] weeks gestation of pregnancy 6. Supervision of high risk pregnancy, antepartum - Culture, OB Urine - GC/Chlamydia probe amp ([place])not at [facility] - CBC/D/Plt+RPR+Rh+ABO+RubIgG... - Hemoglobin A1c - folic acid (FOLVITE) 1 MG tablet; Take 1 tablet (1 mg total) by mouth daily.  Dispense: 30 tablet; Refill: 10 - Comprehensive metabolic panel Initial labs drawn. Continue prenatal vitamins. Problem list reviewed and updated. Genetic Screening discussed, NIPS: declined. Ultrasound discussed; fetal anatomic survey: ordered. Anticipatory guidance about prenatal visits given including labs, ultrasounds, and testing. Encouraged to complete MyChart Registration for her ability to review results, send requests, and have questions addressed.  The nature of [facility]/Faculty Practice with multiple MDs and Advanced Practice Providers was explained to patient; also emphasized that residents, students are part of our team. Routine obstetric precautions reviewed. Encouraged to seek out care at office or emergency room [facility] preferred) for urgent and/or emergent concerns. Return in about 4 weeks (around 11/07/2020) for OFFICE OB VISIT (MD or APP) - desires female provider.     Jaynie Collins, MD, FACOG Obstetrician & Gynecologist, [facility] for [employer], [facility]

## 2020-10-10 NOTE — Patient Instructions (Addendum)
Dental Resources   [place]   Dr. Remer Macho  Exam $85   [address]  Extraction $120 and up   [employer], [place]  *full list of prices [phone]      [facility]  Exam [phone]   [address]  Exam w/ Xrays $380   [place], [place]  Xrays $81 and up   [phone]  Cleaning $101   Extraction $190 and up      [facility]  Cleaning + Xray $344   [address]  Extraction- pt has to be seen first to give price   [place], [place]   [phone]     [facility]   Dr. Romeo Apple Turner/Dr. Richrd Humbles  Exam, Cleaning, Xray $262   [address]  Extraction [phone]   [place]   [phone]      [facility] Dental Department  Cleaning $5   [address] $5   [place], [place] [address]  Call to get on waiting list   [phone] ext [phone]     Dr. Fayrene Fearing McMasters/Dr. Stephenie Acres   [address]  Xray $85 Each   [place], [place] [address]  Extraction [phone]    Dr. Hoover Browns  Extraction $300 per tooth   [address]   [place], [place] [address]   [phone]      Dr. Nuala Alpha  Cleaning $300   [address]  Extraction $273   [place], [place] [address]   [phone]     [facility]  Emergency Exam $65   [address]  Cleaning & Exam $150   [place], [place] [address]  Extractions: Simple $180 Surgical $250   [phone]  [place] [phone]       Second Trimester of Pregnancy  The second trimester of pregnancy is from week 13 through week 27. This is months 4 through 6 of pregnancy. The second trimester is often a time when you feel your best. Your body has adjusted to being pregnant, and you begin to feelbetter physically. During the second trimester: Morning sickness has lessened or stopped completely. You may have more energy. You may have an increase in appetite. The second trimester is also a time when the unborn baby (fetus) is growing rapidly. At the end of the sixth month, the fetus may be up to 12  inches long and weigh about 1 pounds. You will likely begin to feel the baby move (quickening) between 16 and 20 weeks of pregnancy. Body changes during your second trimester Your body continues to go through many changes during your second trimester.The changes vary and generally return to normal after the baby is born. Physical changes Your weight will continue to increase. You will notice your lower abdomen bulging out. You may begin to get stretch marks on your hips, abdomen, and breasts. Your breasts will continue to grow and to become tender. Dark spots or blotches (chloasma or mask of pregnancy) may develop on your face. A dark line from your belly button to the pubic area (linea nigra) may appear. You may have changes in your hair. These can include thickening of your hair, rapid growth, and changes in texture. Some people also have hair loss during or after pregnancy, or hair that feels dry or thin. Health changes You may develop headaches. You may have heartburn. You may develop constipation. You may develop hemorrhoids or  swollen, bulging veins (varicose veins). Your gums may bleed and may be sensitive to brushing and flossing. You may urinate more often because the fetus is pressing on your bladder. You may have back pain. This is caused by: Weight gain. Pregnancy hormones that are relaxing the joints in your pelvis. A shift in weight and the muscles that support your balance. Follow these instructions at home: Medicines Follow your health care provider's instructions regarding medicine use. Specific medicines may be either safe or unsafe to take during pregnancy. Do not take any medicines unless approved by your health care provider. Take a prenatal vitamin that contains at least 600 micrograms (mcg) of folic acid. Eating and drinking Eat a healthy diet that includes fresh fruits and vegetables, whole grains, good sources of protein such as meat, eggs, or tofu, and low-fat  dairy products. Avoid raw meat and unpasteurized juice, milk, and cheese. These carry germs that can harm you and your baby. You may need to take these actions to prevent or treat constipation: Drink enough fluid to keep your urine pale yellow. Eat foods that are high in fiber, such as beans, whole grains, and fresh fruits and vegetables. Limit foods that are high in fat and processed sugars, such as fried or sweet foods. Activity Exercise only as directed by your health care provider. Most people can continue their usual exercise routine during pregnancy. Try to exercise for 30 minutes at least 5 days a week. Stop exercising if you develop contractions in your uterus. Stop exercising if you develop pain or cramping in the lower abdomen or lower back. Avoid exercising if it is very hot or humid or if you are at a high altitude. Avoid heavy lifting. If you choose to, you may have sex unless your health care provider tells you not to. Relieving pain and discomfort Wear a supportive bra to prevent discomfort from breast tenderness. Take warm sitz baths to soothe any pain or discomfort caused by hemorrhoids. Use hemorrhoid cream if your health care provider approves. Rest with your legs raised (elevated) if you have leg cramps or low back pain. If you develop varicose veins: Wear support hose as told by your health care provider. Elevate your feet for 15 minutes, 3-4 times a day. Limit salt in your diet. Safety Wear your seat belt at all times when driving or riding in a car. Talk with your health care provider if someone is verbally or physically abusive to you. Lifestyle Do not use hot tubs, steam rooms, or saunas. Do not douche. Do not use tampons or scented sanitary pads. Avoid cat litter boxes and soil used by cats. These carry germs that can cause birth defects in the baby and possibly loss of the fetus by miscarriage or stillbirth. Do not use herbal remedies, alcohol, illegal drugs, or  medicines that are not approved by your health care provider. Chemicals in these products can harm your baby. Do not use any products that contain nicotine or tobacco, such as cigarettes, e-cigarettes, and chewing tobacco. If you need help quitting, ask your health care provider. General instructions During a routine prenatal visit, your health care provider will do a physical exam and other tests. He or she will also discuss your overall health. Keep all follow-up visits. This is important. Ask your health care provider for a referral to a local prenatal education class. Ask for help if you have counseling or nutritional needs during pregnancy. Your health care provider can offer advice or refer you to  specialists for help with various needs. Where to find more information American Pregnancy Association: americanpregnancy.org [employer] of Obstetricians and Gynecologists: https://www.todd-brady.net/ Office on [employer] Health: MightyReward.co.nz Contact a health care provider if you have: A headache that does not go away when you take medicine. Vision changes or you see spots in front of your eyes. Mild pelvic cramps, pelvic pressure, or nagging pain in the abdominal area. Persistent nausea, vomiting, or diarrhea. A bad-smelling vaginal discharge or foul-smelling urine. Pain when you urinate. Sudden or extreme swelling of your face, hands, ankles, feet, or legs. A fever. Get help right away if you: Have fluid leaking from your vagina. Have spotting or bleeding from your vagina. Have severe abdominal cramping or pain. Have difficulty breathing. Have chest pain. Have fainting spells. Have not felt your baby move for the time period told by your health care provider. Have new or increased pain, swelling, or redness in an arm or leg. Summary The second trimester of pregnancy is from week 13 through week 27 (months 4 through 6). Do not use herbal remedies, alcohol,  illegal drugs, or medicines that are not approved by your health care provider. Chemicals in these products can harm your baby. Exercise only as directed by your health care provider. Most people can continue their usual exercise routine during pregnancy. Keep all follow-up visits. This is important. This information is not intended to replace advice given to you by your health care provider. Make sure you discuss any questions you have with your healthcare provider. Document Revised: 08/01/2019 Document Reviewed: 06/07/2019 Elsevier Patient Education  2022 [employer].

## 2020-10-12 LAB — CULTURE, OB URINE

## 2020-10-12 LAB — URINE CULTURE, OB REFLEX

## 2020-10-14 LAB — CBC/D/PLT+RPR+RH+ABO+RUBIGG...
Antibody Screen: NEGATIVE
Basophils Absolute: 0 10*3/uL (ref 0.0–0.2)
Basos: 1 %
EOS (ABSOLUTE): 0.8 10*3/uL — ABNORMAL HIGH (ref 0.0–0.4)
Eos: 9 %
HCV Ab: 0.1 s/co ratio (ref 0.0–0.9)
HIV Screen 4th Generation wRfx: NONREACTIVE
Hematocrit: 34.1 % (ref 34.0–46.6)
Hemoglobin: 10.9 g/dL — ABNORMAL LOW (ref 11.1–15.9)
Hepatitis B Surface Ag: NEGATIVE
Immature Grans (Abs): 0.1 10*3/uL (ref 0.0–0.1)
Immature Granulocytes: 1 %
Lymphocytes Absolute: 1.3 10*3/uL (ref 0.7–3.1)
Lymphs: 16 %
MCH: 25.3 pg — ABNORMAL LOW (ref 26.6–33.0)
MCHC: 32 g/dL (ref 31.5–35.7)
MCV: 79 fL (ref 79–97)
Monocytes Absolute: 0.6 10*3/uL (ref 0.1–0.9)
Monocytes: 7 %
Neutrophils Absolute: 5.3 10*3/uL (ref 1.4–7.0)
Neutrophils: 66 %
Platelets: 217 10*3/uL (ref 150–450)
RBC: 4.3 x10E6/uL (ref 3.77–5.28)
RDW: 16.3 % — ABNORMAL HIGH (ref 11.7–15.4)
RPR Ser Ql: NONREACTIVE
Rh Factor: POSITIVE
Rubella Antibodies, IGG: 1.96 index (ref >0.99)
WBC: 8 10*3/uL (ref 3.4–10.8)

## 2020-10-14 LAB — COMPREHENSIVE METABOLIC PANEL
ALT: 11 IU/L (ref 0–32)
AST: 17 IU/L (ref 0–40)
Albumin/Globulin Ratio: 1.3 (ref 1.2–2.2)
Albumin: 3.8 g/dL (ref 3.8–4.8)
Alkaline Phosphatase: 53 IU/L (ref 44–121)
BUN/Creatinine Ratio: 16 (ref 9–23)
BUN: 6 mg/dL (ref 6–20)
Bilirubin Total: <0.2 mg/dL (ref 0.0–1.2)
CO2: 18 mmol/L — ABNORMAL LOW (ref 20–29)
Calcium: 8.9 mg/dL (ref 8.7–10.2)
Chloride: 102 mmol/L (ref 96–106)
Creatinine, Ser: 0.38 mg/dL — ABNORMAL LOW (ref 0.57–1.00)
Globulin, Total: 2.9 g/dL (ref 1.5–4.5)
Glucose: 101 mg/dL — ABNORMAL HIGH (ref 65–99)
Potassium: 3.7 mmol/L (ref 3.5–5.2)
Sodium: 135 mmol/L (ref 134–144)
Total Protein: 6.7 g/dL (ref 6.0–8.5)
eGFR: 133 mL/min/{1.73_m2} (ref >59)

## 2020-10-14 LAB — HCV INTERPRETATION

## 2020-10-14 LAB — HEMOGLOBIN A1C
Est. average glucose Bld gHb Est-mCnc: 103 mg/dL
Hgb A1c MFr Bld: 5.2 % (ref 4.8–5.6)

## 2020-11-13 ENCOUNTER — Ambulatory Visit (INDEPENDENT_AMBULATORY_CARE_PROVIDER_SITE_OTHER): Payer: 59 | Admitting: Student

## 2020-11-13 ENCOUNTER — Other Ambulatory Visit: Payer: Self-pay

## 2020-11-13 VITALS — BP 92/54 | HR 103 | Wt 119.0 lb

## 2020-11-13 DIAGNOSIS — Z3A18 18 weeks gestation of pregnancy: Secondary | ICD-10-CM

## 2020-11-13 DIAGNOSIS — O0990 Supervision of high risk pregnancy, unspecified, unspecified trimester: Secondary | ICD-10-CM

## 2020-11-13 NOTE — Progress Notes (Addendum)
   PRENATAL VISIT NOTE  Subjective:  Kelsey Ramirez is a 35 y.o. [id number] at [redacted]w[redacted]d being seen today for ongoing prenatal care.  She is currently monitored for the following issues for this low-risk pregnancy and has Previous cesarean delivery, antepartum; Language barrier affecting health care; Female circumcision; History of miscarriage, currently pregnant; Complication of anesthesia; Supervision of high risk pregnancy, antepartum; and AMA (advanced maternal age) multigravida 35+, first trimester on their problem list.  Patient reports some vaginal pressure at night or when she is standing  Contractions: Not present. Vag. Bleeding: None.  Movement: Absent. Denies leaking of fluid.   The following portions of the patient's history were reviewed and updated as appropriate: allergies, current medications, past family history, past medical history, past social history, past surgical history and problem list.   Objective:   Vitals:   11/13/20 1323  BP: (!) 92/54  Pulse: (!) 103  Weight: 119 lb (54 kg)    Fetal Status: Fetal Heart Rate (bpm): 147   Movement: Absent     General:  Alert, oriented and cooperative. Patient is in no acute distress.  Skin: Skin is warm and dry. No rash noted.   Cardiovascular: Normal heart rate noted  Respiratory: Normal respiratory effort, no problems with respiration noted  Abdomen: Soft, gravid, appropriate for gestational age.  Pain/Pressure: Present     Pelvic: Cervical exam deferred        Extremities: Normal range of motion.  Edema: None  Mental Status: Normal mood and affect. Normal behavior. Normal judgment and thought content.   Assessment and Plan:  Pregnancy: [id number] at [redacted]w[redacted]d 1. [redacted] weeks gestation of pregnancy   2. Supervision of high risk pregnancy, antepartum   -declined AFP today -patient has questions about children visiting hospital; explained visitor policy (most likely no children allowed-suggested she look for babysitter or make alternative  arragnements)  Preterm labor symptoms and general obstetric precautions including but not limited to vaginal bleeding, contractions, leaking of fluid and fetal movement were reviewed in detail with the patient. Please refer to After Visit Summary for other counseling recommendations.   Return in about 4 weeks (around 12/11/2020), or 4 wks LROB with KK.  Future Appointments  Date Time Provider Department Center  11/20/2020  2:30 PM [facility] NURSE [facility]  11/20/2020  2:45 PM [facility] US4 [facility]    Marylene Land, CNM

## 2020-11-13 NOTE — Progress Notes (Signed)
Patient complains of "vaginal pressure". She stated that she's been having this pressure for about a month and causes her have "frequent urination at night". Patient denies any pain or vaginal bleeding/abrnomal discharge

## 2020-11-17 ENCOUNTER — Other Ambulatory Visit: Payer: 59

## 2020-11-17 ENCOUNTER — Ambulatory Visit: Payer: 59

## 2020-11-20 ENCOUNTER — Encounter: Payer: Self-pay | Admitting: *Deleted

## 2020-11-20 ENCOUNTER — Ambulatory Visit: Payer: 59 | Attending: Family Medicine

## 2020-11-20 ENCOUNTER — Other Ambulatory Visit: Payer: Self-pay | Admitting: *Deleted

## 2020-11-20 ENCOUNTER — Ambulatory Visit: Payer: 59 | Admitting: *Deleted

## 2020-11-20 ENCOUNTER — Other Ambulatory Visit: Payer: Self-pay

## 2020-11-20 VITALS — BP 93/55 | HR 105

## 2020-11-20 DIAGNOSIS — O0990 Supervision of high risk pregnancy, unspecified, unspecified trimester: Secondary | ICD-10-CM | POA: Insufficient documentation

## 2020-11-20 DIAGNOSIS — O09521 Supervision of elderly multigravida, first trimester: Secondary | ICD-10-CM | POA: Insufficient documentation

## 2020-11-20 DIAGNOSIS — O09299 Supervision of pregnancy with other poor reproductive or obstetric history, unspecified trimester: Secondary | ICD-10-CM

## 2020-11-20 DIAGNOSIS — O43192 Other malformation of placenta, second trimester: Secondary | ICD-10-CM

## 2020-11-21 ENCOUNTER — Encounter: Payer: Self-pay | Admitting: Family Medicine

## 2020-11-21 DIAGNOSIS — O43199 Other malformation of placenta, unspecified trimester: Secondary | ICD-10-CM | POA: Insufficient documentation

## 2020-12-05 ENCOUNTER — Emergency Department (HOSPITAL_BASED_OUTPATIENT_CLINIC_OR_DEPARTMENT_OTHER)
Admission: EM | Admit: 2020-12-05 | Discharge: 2020-12-05 | Disposition: A | Discharge status: Home / Self Care | Payer: 59 | Attending: Emergency Medicine | Admitting: Emergency Medicine

## 2020-12-05 ENCOUNTER — Other Ambulatory Visit: Payer: Self-pay

## 2020-12-05 ENCOUNTER — Encounter (HOSPITAL_BASED_OUTPATIENT_CLINIC_OR_DEPARTMENT_OTHER): Payer: Self-pay | Admitting: Emergency Medicine

## 2020-12-05 DIAGNOSIS — Z5321 Procedure and treatment not carried out due to patient leaving prior to being seen by health care provider: Secondary | ICD-10-CM | POA: Insufficient documentation

## 2020-12-05 DIAGNOSIS — R040 Epistaxis: Secondary | ICD-10-CM | POA: Insufficient documentation

## 2020-12-05 NOTE — ED Triage Notes (Signed)
Pt reports nosebleed twice/day for past 10 days. No active bleeding at present.  Pt reports bleeds last for about 2-3 minutes each time.  Pt [redacted] weeks pregnant.  Pt states she is able to feel baby move

## 2020-12-05 NOTE — ED Notes (Signed)
Pt husband states they have a very important appointment at 4:00 pm.  Pt left without being seen.

## 2020-12-06 ENCOUNTER — Other Ambulatory Visit: Payer: Self-pay

## 2020-12-06 ENCOUNTER — Emergency Department (HOSPITAL_BASED_OUTPATIENT_CLINIC_OR_DEPARTMENT_OTHER)
Admission: EM | Admit: 2020-12-06 | Discharge: 2020-12-06 | Disposition: A | Discharge status: Home / Self Care | Payer: 59 | Attending: Emergency Medicine | Admitting: Emergency Medicine

## 2020-12-06 ENCOUNTER — Encounter (HOSPITAL_BASED_OUTPATIENT_CLINIC_OR_DEPARTMENT_OTHER): Payer: Self-pay | Admitting: Emergency Medicine

## 2020-12-06 DIAGNOSIS — R040 Epistaxis: Secondary | ICD-10-CM | POA: Diagnosis not present

## 2020-12-06 MED ORDER — OXYMETAZOLINE HCL 0.05 % NA SOLN
1.0000 | Freq: Once | NASAL | Status: AC
  Administered 2020-12-06: 1 via NASAL
  Filled 2020-12-06: qty 30

## 2020-12-06 NOTE — ED Provider Notes (Signed)
MEDCENTER [facility] EMERGENCY DEPT Provider Note   CSN: [id number] Arrival date & time: 12/06/20  1201     History Chief Complaint  Patient presents with   Epistaxis    Kelsey Ramirez is a 35 y.o. female.  35 year old female presents the emergency room at [redacted] weeks pregnant with concern for nosebleeds.  Bleeding reportedly from the right side, occurs intermittently for the past 10 days.  Bleeding is reported to be brief, resolves when she tilts her head forward and applies a cold rag to her nose.  No history of trauma to the nose.  No history of easy bruising.  Patient has had routine health care and compliant with her prenatal vitamin.  Patient is not actively bleeding.  No other complaints or concerns today.      Past Medical History:  Diagnosis Date   Anemia    last pregnancy   Complication of anesthesia    Feels Epidural caused BP to drop   Female circumcision    Menorrhagia 06/19/2020    Patient Active Problem List   Diagnosis Date Noted   Marginal insertion of umbilical cord affecting management of mother 11/21/2020   AMA (advanced maternal age) multigravida 35+, first trimester 10/10/2020   Complication of anesthesia 08/22/2020   Supervision of high risk pregnancy, antepartum 08/22/2020   History of miscarriage, currently pregnant 04/30/2018   Language barrier affecting health care 06/23/2016   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 Pt states was  elective due to female circ.          No family history on file.  Social History   Tobacco Use   Smoking status: Never   Smokeless tobacco: Never  Vaping Use   Vaping Use: Never used  Substance Use Topics   Alcohol use: No   Drug use: No    Home Medications Prior to Admission medications   Medication Sig Start Date End Date Taking? Authorizing Provider  Blood Pressure Monitoring DEVI 1 each by Does not apply route once a week. Patient not taking: Reported on 11/13/2020 08/22/20   Federico Flake, MD  folic acid (FOLVITE) 1 MG tablet Take 1 tablet (1 mg total) by mouth daily. 10/10/20   Anyanwu, Jethro Bastos, MD  Prenatal Vit-Fe Fumarate-FA (PREPLUS) 27-1 MG TABS Take 1 tablet by mouth daily. 07/31/20   Federico Flake, MD  promethazine (PHENERGAN) 25 MG tablet Take 1 tablet (25 mg total) by mouth every 6 (six) hours as needed for nausea or vomiting. Patient not taking: Reported on 11/13/2020 08/22/20   Federico Flake, MD  pyridOXINE (VITAMIN B-6) 50 MG tablet Take 1 tablet (50 mg total) by mouth daily. Patient not taking: No sig reported 08/22/20   Lyndel Safe  Jacelyn Pi, MD  senna-docusate (SENOKOT-S) 8.6-50 MG tablet Take 1 tablet by mouth daily. Patient not taking: Reported on 11/13/2020 08/22/20   Federico Flake, MD    Allergies    Patient has no active allergies.  Review of Systems   Review of Systems  Constitutional:  Negative for fever.  HENT:  Positive for nosebleeds. Negative for congestion and facial swelling.   Skin:  Negative for rash and wound.  Allergic/Immunologic: Positive for immunocompromised state.  Neurological:  Negative for weakness.  Hematological:  Does not bruise/bleed easily.  All other systems reviewed and are negative.  Physical Exam Updated Vital Signs BP 98/66 (BP Location: Right Arm)   Pulse (!) 104   Temp 98.3 F (36.8 C) (Oral)   Resp 16   LMP 07/05/2020 (Exact Date)   SpO2 100%   Physical Exam Vitals and nursing  note reviewed.  Constitutional:      General: She is not in acute distress.    Appearance: She is well-developed. She is not diaphoretic.  HENT:     Head: Normocephalic and atraumatic.     Nose: No septal deviation, signs of injury, nasal tenderness, mucosal edema or rhinorrhea.     Right Nostril: Epistaxis present. No septal hematoma.     Left Nostril: No epistaxis or septal hematoma.     Comments: Small scab to septum just inside right nostril, no active bleeding    Mouth/Throat:     Pharynx: Oropharynx is clear.  Eyes:     Conjunctiva/sclera: Conjunctivae normal.  Pulmonary:     Effort: Pulmonary effort is normal.  Musculoskeletal:     Cervical back: Neck supple.  Lymphadenopathy:     Cervical: No cervical adenopathy.  Skin:    General: Skin is warm and dry.     Findings: No bruising, erythema or rash.  Neurological:     Mental Status: She is alert and oriented to person, place, and time.  Psychiatric:        Behavior: Behavior normal.    ED Results / Procedures / Treatments   Labs (all labs ordered are listed, but only abnormal results are displayed) Labs Reviewed - No data to display  EKG None  Radiology No results found.  Procedures Procedures   Medications Ordered in ED Medications  oxymetazoline (AFRIN) 0.05 % nasal spray 1 spray (1 spray Each Nare Given 12/06/20 1245)    ED Course  I have reviewed the triage vital signs and the nursing notes.  Pertinent labs & imaging results that were available during my care of the patient were reviewed by me and considered in my medical decision making (see chart for details).  Clinical Course as of 12/06/20 1257  Sat Dec 06, 2020  [id number] 35 year old female presents at [redacted] weeks gestation with report of intermittent nosebleeds for the past 10 days.  Found to have a small scab to the septum just inside the right nostril.  Suspect this is source of bleeding.  No active bleeding at this time. No concerns for bruising or  other bleeding. Offered reassurance, recommend if bleeding resumes, apply Afrin (provided in the emergency department today), apply pressure.  Return to ED if bleeding continues otherwise follow-up with OB.  Given referral to ENT if needed. [LM]    Clinical Course User Index [LM] Alden Hipp   MDM Rules/Calculators/A&P  Final Clinical Impression(s) / ED Diagnoses Final diagnoses:  Epistaxis, recurrent    Rx / DC Orders ED Discharge Orders     None        Jeannie Fend, PA-C 12/06/20 1257    Tegeler, Canary Brim, MD 12/06/20 1517

## 2020-12-06 NOTE — Discharge Instructions (Addendum)
If you have another nosebleed-  Blow your nose to remove any clot Spray the afrin in both sides of your nose Hold pressure- pinch the soft part of the nose closed and hold for 15 minutes, do NOT let go to check the bleeding  If the bleeding will not stop, come to the ER. If the bleeding stops, you can recheck with your OB office of follow up with ENT (referral given).

## 2020-12-06 NOTE — ED Triage Notes (Signed)
Pt reports nosebleed for past 10 days. No active bleeding now. Pt is [redacted] weeks pregnant.

## 2020-12-11 ENCOUNTER — Ambulatory Visit (INDEPENDENT_AMBULATORY_CARE_PROVIDER_SITE_OTHER): Payer: 59 | Admitting: Student

## 2020-12-11 ENCOUNTER — Other Ambulatory Visit: Payer: Self-pay

## 2020-12-11 VITALS — BP 95/67 | HR 106 | Wt 122.8 lb

## 2020-12-11 DIAGNOSIS — Z3480 Encounter for supervision of other normal pregnancy, unspecified trimester: Secondary | ICD-10-CM

## 2020-12-11 DIAGNOSIS — Z3A22 22 weeks gestation of pregnancy: Secondary | ICD-10-CM

## 2020-12-11 NOTE — Progress Notes (Signed)
Patient ID: Kelsey Ramirez, female   DOB: 12-05-1985, 35 y.o.   MRN: [id number]   PRENATAL VISIT NOTE  Subjective:  Kelsey Ramirez is a 35 y.o. [id number] at [redacted]w[redacted]d being seen today for ongoing prenatal care.  She is currently monitored for the following issues for this low-risk pregnancy and has Previous cesarean delivery, antepartum; Language barrier affecting health care; Female circumcision; History of miscarriage, currently pregnant; Complication of anesthesia; Supervision of high risk pregnancy, antepartum; AMA (advanced maternal age) multigravida 35+, first trimester; and Marginal insertion of umbilical cord affecting management of mother on their problem list.  Patient reports no complaints.  Contractions: Not present. Vag. Bleeding: None.  Movement: Present. Denies leaking of fluid. She and her husband have many concerns about delivery plan and childcare. She states that her kids have not been away from her and that she and her husband wants her children (ages 29 and 5) to come to the [facility] with her. She also has questions about the "problems' with her placenta and about her nose bleed.   The following portions of the patient's history were reviewed and updated as appropriate: allergies, current medications, past family history, past medical history, past social history, past surgical history and problem list.   Objective:   Vitals:   12/11/20 1149  BP: 95/67  Pulse: (!) 106  Weight: 122 lb 12.8 oz (55.7 kg)    Fetal Status: Fetal Heart Rate (bpm): 150   Movement: Present     General:  Alert, oriented and cooperative. Patient is in no acute distress.  Skin: Skin is warm and dry. No rash noted.   Cardiovascular: Normal heart rate noted  Respiratory: Normal respiratory effort, no problems with respiration noted  Abdomen: Soft, gravid, appropriate for gestational age.  Pain/Pressure: Absent     Pelvic: Cervical exam deferred        Extremities: Normal range of motion.  Edema: None  Mental  Status: Normal mood and affect. Normal behavior. Normal judgment and thought content.   Assessment and Plan:  Pregnancy: G4P2012 at [redacted]w[redacted]d   1. [redacted] weeks gestation of pregnancy   -answered questions about marginal insertion; advised patient that she will be monitored with growth scans but that we do not need to change any delivery plans at this time -discussed social supports for patient and delivery planning; encouraged patient to connect with other parents and that, since her c/section will be scheduled, she can try to arrange childcare in advance -discussed that nose bleeds are common, especially in pregnancy but she should let us know if they continue  Preterm labor symptoms and general obstetric precautions including but not limited to vaginal bleeding, contractions, leaking of fluid and fetal movement were reviewed in detail with the patient. Please refer to After Visit Summary for other counseling recommendations.   No follow-ups on file.  Future Appointments  Date Time Provider Department Center  12/18/2020  3:00 PM [facility]URSE [facility]  12/18/2020  3:15 PM [facility]  01/07/2021  9:55 AM Crisoforo Oxford, Charlesetta Garibaldi, CNM [facility]    Marylene Land, CNM

## 2020-12-11 NOTE — Progress Notes (Signed)
Patient reports nose bleed 2 weeks ago. Also has questions about recent ultrasound

## 2020-12-18 ENCOUNTER — Encounter: Payer: Self-pay | Admitting: *Deleted

## 2020-12-18 ENCOUNTER — Ambulatory Visit: Payer: 59 | Attending: Obstetrics

## 2020-12-18 ENCOUNTER — Ambulatory Visit: Payer: 59 | Admitting: *Deleted

## 2020-12-18 ENCOUNTER — Other Ambulatory Visit: Payer: Self-pay

## 2020-12-18 VITALS — BP 87/54 | HR 108

## 2020-12-18 DIAGNOSIS — O43199 Other malformation of placenta, unspecified trimester: Secondary | ICD-10-CM | POA: Insufficient documentation

## 2020-12-18 DIAGNOSIS — Z3A23 23 weeks gestation of pregnancy: Secondary | ICD-10-CM

## 2020-12-18 DIAGNOSIS — O09521 Supervision of elderly multigravida, first trimester: Secondary | ICD-10-CM

## 2020-12-18 DIAGNOSIS — O0990 Supervision of high risk pregnancy, unspecified, unspecified trimester: Secondary | ICD-10-CM

## 2020-12-18 DIAGNOSIS — O09299 Supervision of pregnancy with other poor reproductive or obstetric history, unspecified trimester: Secondary | ICD-10-CM | POA: Diagnosis present

## 2020-12-18 DIAGNOSIS — O09522 Supervision of elderly multigravida, second trimester: Secondary | ICD-10-CM

## 2020-12-18 DIAGNOSIS — O43192 Other malformation of placenta, second trimester: Secondary | ICD-10-CM | POA: Insufficient documentation

## 2020-12-18 DIAGNOSIS — O34219 Maternal care for unspecified type scar from previous cesarean delivery: Secondary | ICD-10-CM | POA: Diagnosis not present

## 2020-12-19 ENCOUNTER — Other Ambulatory Visit: Payer: Self-pay | Admitting: *Deleted

## 2020-12-19 DIAGNOSIS — O43199 Other malformation of placenta, unspecified trimester: Secondary | ICD-10-CM

## 2020-12-19 DIAGNOSIS — O09522 Supervision of elderly multigravida, second trimester: Secondary | ICD-10-CM

## 2020-12-19 DIAGNOSIS — O34219 Maternal care for unspecified type scar from previous cesarean delivery: Secondary | ICD-10-CM

## 2021-01-07 ENCOUNTER — Other Ambulatory Visit: Payer: Self-pay

## 2021-01-07 ENCOUNTER — Ambulatory Visit (INDEPENDENT_AMBULATORY_CARE_PROVIDER_SITE_OTHER): Payer: 59 | Admitting: Student

## 2021-01-07 VITALS — BP 100/66 | HR 113 | Wt 128.3 lb

## 2021-01-07 DIAGNOSIS — Z3A26 26 weeks gestation of pregnancy: Secondary | ICD-10-CM

## 2021-01-07 DIAGNOSIS — Z3490 Encounter for supervision of normal pregnancy, unspecified, unspecified trimester: Secondary | ICD-10-CM

## 2021-01-07 MED ORDER — DOCUSATE SODIUM 100 MG PO CAPS: 100.0000 mg | ORAL_CAPSULE | Freq: Three times a day (TID) | ORAL | 0 refills | Status: DC

## 2021-01-07 MED ORDER — PREPLUS 27-1 MG PO TABS: 1.0000 | ORAL_TABLET | Freq: Every day | ORAL | 13 refills | Status: DC

## 2021-01-07 NOTE — Progress Notes (Signed)
   PRENATAL VISIT NOTE  Subjective:  Kelsey Ramirez is a 35 y.o. [id number] at [redacted]w[redacted]d being seen today for ongoing prenatal care.  She is currently monitored for the following issues for this low-risk pregnancy and has Previous cesarean delivery, antepartum; Language barrier affecting health care; Female circumcision; History of miscarriage, currently pregnant; Complication of anesthesia; Supervision of high risk pregnancy, antepartum; AMA (advanced maternal age) multigravida 35+, first trimester; and Marginal insertion of umbilical cord affecting management of mother on their problem list.  Patient reports no complaints.  She reports some constipation and would like something for that. She would also like a new prescription for her prenatal vitamins (she says that the pharmacy would not take her refills).  Contractions: Not present. Vag. Bleeding: None.  Movement: Present. Denies leaking of fluid.   The following portions of the patient's history were reviewed and updated as appropriate: allergies, current medications, past family history, past medical history, past social history, past surgical history and problem list.   Objective:   Vitals:   01/07/21 1011  BP: 100/66  Pulse: (!) 113  Weight: 128 lb 4.8 oz (58.2 kg)    Fetal Status: Fetal Heart Rate (bpm): 159 Fundal Height: 25 cm Movement: Present     General:  Alert, oriented and cooperative. Patient is in no acute distress.  Skin: Skin is warm and dry. No rash noted.   Cardiovascular: Normal heart rate noted  Respiratory: Normal respiratory effort, no problems with respiration noted  Abdomen: Soft, gravid, appropriate for gestational age.  Pain/Pressure: Absent     Pelvic: Cervical exam deferred        Extremities: Normal range of motion.  Edema: None  Mental Status: Normal mood and affect. Normal behavior. Normal judgment and thought content.   Assessment and Plan:  Pregnancy: [id number] at [redacted]w[redacted]d    1. [redacted] weeks gestation of  pregnancy   2. Early stage of pregnancy    -new RX for prenatal vitamins to pharmacy on file -discussed comfort measures for constipation; will also give stool softener -new RX for prenatal vitamins given -discussed 2 hour GTT and fasting education, etc.   Live interpreter used for the entire encounter   Preterm labor symptoms and general obstetric precautions including but not limited to vaginal bleeding, contractions, leaking of fluid and fetal movement were reviewed in detail with the patient. Please refer to After Visit Summary for other counseling recommendations.   Return in about 3 weeks (around 01/28/2021), or 3 weeks for LROB with female provider and 2 hour gtt.  Future Appointments  Date Time Provider Department Center  01/16/2021 11:00 AM [facility] NURSE [facility]  01/16/2021 11:15 AM [facility] US2 [facility]    Marylene Land, CNM

## 2021-01-13 ENCOUNTER — Other Ambulatory Visit: Payer: Self-pay | Admitting: *Deleted

## 2021-01-13 DIAGNOSIS — O0990 Supervision of high risk pregnancy, unspecified, unspecified trimester: Secondary | ICD-10-CM

## 2021-01-16 ENCOUNTER — Other Ambulatory Visit: Payer: Self-pay | Admitting: *Deleted

## 2021-01-16 ENCOUNTER — Ambulatory Visit: Payer: 59 | Attending: Obstetrics

## 2021-01-16 ENCOUNTER — Encounter: Payer: Self-pay | Admitting: *Deleted

## 2021-01-16 ENCOUNTER — Ambulatory Visit: Payer: 59 | Admitting: *Deleted

## 2021-01-16 ENCOUNTER — Other Ambulatory Visit: Payer: Self-pay

## 2021-01-16 VITALS — BP 95/59 | HR 105

## 2021-01-16 DIAGNOSIS — O43199 Other malformation of placenta, unspecified trimester: Secondary | ICD-10-CM | POA: Insufficient documentation

## 2021-01-16 DIAGNOSIS — O43192 Other malformation of placenta, second trimester: Secondary | ICD-10-CM | POA: Diagnosis not present

## 2021-01-16 DIAGNOSIS — O09521 Supervision of elderly multigravida, first trimester: Secondary | ICD-10-CM | POA: Diagnosis present

## 2021-01-16 DIAGNOSIS — O09299 Supervision of pregnancy with other poor reproductive or obstetric history, unspecified trimester: Secondary | ICD-10-CM | POA: Insufficient documentation

## 2021-01-16 DIAGNOSIS — Z3A27 27 weeks gestation of pregnancy: Secondary | ICD-10-CM

## 2021-01-16 DIAGNOSIS — O34219 Maternal care for unspecified type scar from previous cesarean delivery: Secondary | ICD-10-CM

## 2021-01-16 DIAGNOSIS — O09522 Supervision of elderly multigravida, second trimester: Secondary | ICD-10-CM | POA: Diagnosis present

## 2021-01-16 DIAGNOSIS — O0990 Supervision of high risk pregnancy, unspecified, unspecified trimester: Secondary | ICD-10-CM

## 2021-01-22 ENCOUNTER — Ambulatory Visit (INDEPENDENT_AMBULATORY_CARE_PROVIDER_SITE_OTHER): Payer: 59 | Admitting: Nurse Practitioner

## 2021-01-22 ENCOUNTER — Other Ambulatory Visit: Payer: 59

## 2021-01-22 ENCOUNTER — Other Ambulatory Visit: Payer: Self-pay

## 2021-01-22 VITALS — BP 100/51 | HR 100 | Wt 127.6 lb

## 2021-01-22 DIAGNOSIS — O0990 Supervision of high risk pregnancy, unspecified, unspecified trimester: Secondary | ICD-10-CM

## 2021-01-22 DIAGNOSIS — O26899 Other specified pregnancy related conditions, unspecified trimester: Secondary | ICD-10-CM

## 2021-01-22 DIAGNOSIS — O09521 Supervision of elderly multigravida, first trimester: Secondary | ICD-10-CM

## 2021-01-22 DIAGNOSIS — Z789 Other specified health status: Secondary | ICD-10-CM

## 2021-01-22 DIAGNOSIS — Z3A28 28 weeks gestation of pregnancy: Secondary | ICD-10-CM

## 2021-01-22 DIAGNOSIS — R12 Heartburn: Secondary | ICD-10-CM

## 2021-01-22 DIAGNOSIS — O34219 Maternal care for unspecified type scar from previous cesarean delivery: Secondary | ICD-10-CM

## 2021-01-22 DIAGNOSIS — R42 Dizziness and giddiness: Secondary | ICD-10-CM

## 2021-01-22 MED ORDER — OMEPRAZOLE 40 MG PO CPDR: 40.0000 mg | DELAYED_RELEASE_CAPSULE | Freq: Every day | ORAL | 1 refills | Status: DC

## 2021-01-22 NOTE — Progress Notes (Signed)
Pt only has questions about her Hemoglobin.

## 2021-01-22 NOTE — Progress Notes (Signed)
    Subjective:  Kelsey Ramirez is a 35 y.o. [id number] at [redacted]w[redacted]d being seen today for ongoing prenatal care.  She is currently monitored for the following issues for this high-risk pregnancy and has Previous cesarean delivery, antepartum; Language barrier affecting health care; Female circumcision; History of miscarriage, currently pregnant; Complication of anesthesia; Supervision of high risk pregnancy, antepartum; AMA (advanced maternal age) multigravida 35+, first trimester; and Marginal insertion of umbilical cord affecting management of mother on their problem list.  Patient reports  heartburn and occasional dizziness .  Contractions: Irritability. Vag. Bleeding: None.  Movement: Present. Denies leaking of fluid.   The following portions of the patient's history were reviewed and updated as appropriate: allergies, current medications, past family history, past medical history, past social history, past surgical history and problem list. Problem list updated.  Objective:   Vitals:   01/22/21 1011  BP: (!) 100/51  Pulse: 100  Weight: 127 lb 9.6 oz (57.9 kg)    Fetal Status: Fetal Heart Rate (bpm): 140 Fundal Height: 28 cm Movement: Present     General:  Alert, oriented and cooperative. Patient is in no acute distress.  Skin: Skin is warm and dry. No rash noted.   Cardiovascular: Normal heart rate noted  Respiratory: Normal respiratory effort, no problems with respiration noted  Abdomen: Soft, gravid, appropriate for gestational age. Pain/Pressure: Present     Pelvic:  Cervical exam deferred        Extremities: Normal range of motion.  Edema: None  Mental Status: Normal mood and affect. Normal behavior. Normal judgment and thought content.   Urinalysis:      Assessment and Plan:  Pregnancy: G4W1027 at [redacted]w[redacted]d  1. Supervision of high risk pregnancy, antepartum Baby moving well Glucola being done today Declines TDAP today - given info on prevention of whooping cough for her and her  baby.  Will consider TDAP at next visit.  2. Previous cesarean delivery, antepartum Plans repeat C/S  3. AMA (advanced maternal age) multigravida 35+, first trimester   4. Language barrier In person interpreter present for the entire visit  5. Dizziness Eating 2 meals a day only due to heartburn Advised smaller meals and snacks between meals  6. Heartburn during pregnancy, antepartum Prescription sent to pharmacy  7. [redacted] weeks gestation of pregnancy   Preterm labor symptoms and general obstetric precautions including but not limited to vaginal bleeding, contractions, leaking of fluid and fetal movement were reviewed in detail with the patient. Please refer to After Visit Summary for other counseling recommendations.  Return in about 2 weeks (around 02/05/2021) for in person [facility] with Female MD to schedule repeat C/S.  Nolene Bernheim, RN, MSN, NP-BC Nurse Practitioner, [facility] for [employer], [facility] 01/22/2021 10:33 AM

## 2021-01-23 ENCOUNTER — Other Ambulatory Visit: Payer: Self-pay | Admitting: Nurse Practitioner

## 2021-01-23 DIAGNOSIS — O24410 Gestational diabetes mellitus in pregnancy, diet controlled: Secondary | ICD-10-CM

## 2021-01-23 DIAGNOSIS — O24419 Gestational diabetes mellitus in pregnancy, unspecified control: Secondary | ICD-10-CM | POA: Insufficient documentation

## 2021-01-23 LAB — RPR: RPR Ser Ql: NONREACTIVE

## 2021-01-23 LAB — CBC
Hematocrit: 33.4 % — ABNORMAL LOW (ref 34.0–46.6)
Hemoglobin: 10.9 g/dL — ABNORMAL LOW (ref 11.1–15.9)
MCH: 27.7 pg (ref 26.6–33.0)
MCHC: 32.6 g/dL (ref 31.5–35.7)
MCV: 85 fL (ref 79–97)
Platelets: 278 10*3/uL (ref 150–450)
RBC: 3.93 x10E6/uL (ref 3.77–5.28)
RDW: 14.3 % (ref 11.7–15.4)
WBC: 8.2 10*3/uL (ref 3.4–10.8)

## 2021-01-23 LAB — GLUCOSE TOLERANCE, 2 HOURS W/ 1HR
Glucose, 1 hour: 169 mg/dL (ref 70–179)
Glucose, 2 hour: 164 mg/dL — ABNORMAL HIGH (ref 70–152)
Glucose, Fasting: 92 mg/dL — ABNORMAL HIGH (ref 70–91)

## 2021-01-23 LAB — HIV ANTIBODY (ROUTINE TESTING W REFLEX): HIV Screen 4th Generation wRfx: NONREACTIVE

## 2021-02-05 ENCOUNTER — Encounter: Payer: 59 | Attending: Nurse Practitioner | Admitting: Registered"

## 2021-02-05 ENCOUNTER — Ambulatory Visit: Payer: 59 | Admitting: Registered"

## 2021-02-05 ENCOUNTER — Other Ambulatory Visit: Payer: Self-pay | Admitting: *Deleted

## 2021-02-05 ENCOUNTER — Other Ambulatory Visit: Payer: Self-pay

## 2021-02-05 VITALS — Wt 129.3 lb

## 2021-02-05 DIAGNOSIS — O24410 Gestational diabetes mellitus in pregnancy, diet controlled: Secondary | ICD-10-CM

## 2021-02-05 DIAGNOSIS — O0990 Supervision of high risk pregnancy, unspecified, unspecified trimester: Secondary | ICD-10-CM

## 2021-02-05 DIAGNOSIS — O24419 Gestational diabetes mellitus in pregnancy, unspecified control: Secondary | ICD-10-CM

## 2021-02-05 MED ORDER — ACCU-CHEK SOFTCLIX LANCETS MISC: 12 refills | Status: DC

## 2021-02-05 MED ORDER — ACCU-CHEK GUIDE VI STRP: ORAL_STRIP | 12 refills | Status: DC

## 2021-02-05 NOTE — Progress Notes (Signed)
In-person interpreter Franco Collet from [facility]  Patient was seen for Gestational Diabetes self-management on 02/05/21  Start time 1120 and End time 1220   Estimated due date: 04/11/21; [redacted]w[redacted]d  Clinical: Medications: reviewed Medical History: c-sec Labs: OGTT92(H)-169-164(H); A1c 5.2%   Wt Readings from Last 3 Encounters:  02/05/21 129 lb 4.8 oz (58.7 kg)  01/22/21 127 lb 9.6 oz (57.9 kg)  01/07/21 128 lb 4.8 oz (58.2 kg)     Dietary and Lifestyle History: Pt states she has stopped drinking juice, eating chocolate and rice. Pt was concerned that she might be having too low of blood sugar with changes.  Pt states she was told she needs to gain more weight.  Physical Activity: ADL, pt concerned about safety Stress: not assessed Sleep: not assessed  24 hr Recall:  First Meal: cheese, egg, pita Snack: Second meal:chicken, salad, pita (1 pm) Snack:5 small zero sugar cookies, milk Third meal: same as lunch (11 pm) Snack: Beverages: water, tea, coffee with 2 spoonfuls of powdered milk  NUTRITION INTERVENTION  Nutrition education (E-1) on the following topics:   Initial Follow-up  [x]  []  Definition of Gestational Diabetes []  []  Why dietary management is important in controlling blood glucose [x]  []  Effects each nutrient has on blood glucose levels []  []  Simple carbohydrates vs complex carbohydrates []  []  Fluid intake [x]  []  Creating a balanced meal plan [x]  []  Carbohydrate counting  [x]  []  When to check blood glucose levels [x]  []  Proper blood glucose monitoring techniques [x]  []  Effect of stress and stress reduction techniques  [x]  []  Exercise effect on blood glucose levels, appropriate exercise during pregnancy []  []  Importance of limiting caffeine and abstaining from alcohol and smoking []  []  Medications used for blood sugar control during pregnancy []  []  Hypoglycemia and rule of 15 []  []  Postpartum self care  Blood glucose monitor given: Accu-chek Guide Me Lot  Exp: 04/17/2022 CBG: 93 mg/dL   Patient instructed to monitor glucose levels: FBS: 60 - ? 95 mg/dL (some clinics use 90 for cutoff) 1 hour: ? 140 mg/dL 2 hour: ? mg/dL  Patient received handouts: Nutrition Diabetes and Pregnancy Carbohydrate Counting List  Patient will be seen for follow-up in 1 week to continue education or as needed.

## 2021-02-08 ENCOUNTER — Encounter (HOSPITAL_COMMUNITY): Payer: Self-pay | Admitting: Obstetrics and Gynecology

## 2021-02-08 ENCOUNTER — Inpatient Hospital Stay (HOSPITAL_COMMUNITY)
Admission: AD | Admit: 2021-02-08 | Discharge: 2021-02-08 | Disposition: A | Discharge status: Home / Self Care | Payer: 59 | Attending: Obstetrics and Gynecology | Admitting: Obstetrics and Gynecology

## 2021-02-08 ENCOUNTER — Other Ambulatory Visit: Payer: Self-pay

## 2021-02-08 DIAGNOSIS — J111 Influenza due to unidentified influenza virus with other respiratory manifestations: Secondary | ICD-10-CM

## 2021-02-08 DIAGNOSIS — J101 Influenza due to other identified influenza virus with other respiratory manifestations: Secondary | ICD-10-CM | POA: Diagnosis not present

## 2021-02-08 DIAGNOSIS — O99513 Diseases of the respiratory system complicating pregnancy, third trimester: Secondary | ICD-10-CM | POA: Diagnosis present

## 2021-02-08 DIAGNOSIS — O34219 Maternal care for unspecified type scar from previous cesarean delivery: Secondary | ICD-10-CM

## 2021-02-08 DIAGNOSIS — O2693 Pregnancy related conditions, unspecified, third trimester: Secondary | ICD-10-CM | POA: Diagnosis not present

## 2021-02-08 DIAGNOSIS — Z20822 Contact with and (suspected) exposure to covid-19: Secondary | ICD-10-CM | POA: Diagnosis not present

## 2021-02-08 DIAGNOSIS — Z3A31 31 weeks gestation of pregnancy: Secondary | ICD-10-CM | POA: Insufficient documentation

## 2021-02-08 HISTORY — DX: Gestational diabetes mellitus in pregnancy, unspecified control: O24.419

## 2021-02-08 LAB — GLUCOSE, CAPILLARY: Glucose-Capillary: 80 mg/dL (ref 70–99)

## 2021-02-08 LAB — URINALYSIS, ROUTINE W REFLEX MICROSCOPIC
Bilirubin Urine: NEGATIVE
Glucose, UA: NEGATIVE mg/dL
Hgb urine dipstick: NEGATIVE
Ketones, ur: 80 mg/dL — AB
Nitrite: NEGATIVE
Protein, ur: NEGATIVE mg/dL
Specific Gravity, Urine: 1.02 (ref 1.005–1.030)
pH: 6 (ref 5.0–8.0)

## 2021-02-08 LAB — RESP PANEL BY RT-PCR (FLU A&B, COVID) ARPGX2
Influenza A by PCR: POSITIVE — AB
Influenza B by PCR: NEGATIVE
SARS Coronavirus 2 by RT PCR: NEGATIVE

## 2021-02-08 MED ORDER — OSELTAMIVIR PHOSPHATE 75 MG PO CAPS: 75.0000 mg | ORAL_CAPSULE | Freq: Two times a day (BID) | ORAL | 0 refills | Status: AC

## 2021-02-08 MED ORDER — ACETAMINOPHEN 500 MG PO TABS
1000.0000 mg | ORAL_TABLET | Freq: Once | ORAL | Status: AC
  Administered 2021-02-08: 16:00:00 1000 mg via ORAL
  Filled 2021-02-08: qty 2

## 2021-02-08 NOTE — MAU Provider Note (Signed)
History     CSN: [id number]  Arrival date and time: 02/08/21 1402   Event Date/Time   First Provider Initiated Contact with Patient 02/08/21 1519      Chief Complaint  Patient presents with   Headache   Cold Symptoms   HPI Kelsey Ramirez is a 35 y.o. [id number] at [redacted]w[redacted]d who presents for headache & cough. Symptoms started last night. Reports dull headache that she rates 8/10.  Has not treated symptoms.  Also reports nonproductive cough, nasal congestion, sore throat, and body aches.  Denies sick contacts.  Has not gotten flu or COVID-vaccine.  Denies shortness of breath, chest pain, abdominal pain, vaginal bleeding, or leaking of fluid.  Reports good fetal movement.  OB History     Gravida  4   Para  2   Term  2   Preterm      AB  1   Living  2      SAB  1   IAB      Ectopic      Multiple  0   Live Births  2        Obstetric Comments  G1: in [place]. Pt states was elective due to female circ.          Past Medical History:  Diagnosis Date   Anemia    last pregnancy   Complication of anesthesia    Feels Epidural caused BP to drop   Female circumcision    Gestational diabetes    Menorrhagia 06/19/2020    Past Surgical History:  Procedure Laterality Date   CESAREAN SECTION     CESAREAN SECTION N/A 12/30/2016   Procedure: REPEAT CESAREAN SECTION;  Surgeon: Tilda Burrow, MD;  Location: [facility] BIRTHING SU[facility];  Service: Obstetrics;  Laterality: N/A;    Family History  Problem Relation Age of Onset   Cancer Neg Hx    Diabetes Neg Hx    Hypertension Neg Hx     Social History   Tobacco Use   Smoking status: Never   Smokeless tobacco: Never  Vaping Use   Vaping Use: Never used  Substance Use Topics   Alcohol use: No   Drug use: No    Allergies: No Known Allergies  Medications Prior to Admission  Medication Sig Dispense Refill Last Dose   Accu-Chek Softclix Lancets lancets Use as instructed 100 each 12    Blood Pressure Monitoring  DEVI 1 each by Does not apply route once a week. (Patient not taking: No sig reported) 1 each 0    docusate sodium (COLACE) 100 MG capsule Take 1 capsule (100 mg total) by mouth 3 (three) times daily. 10 capsule 0    folic acid (FOLVITE) 1 MG tablet Take 1 tablet (1 mg total) by mouth daily. (Patient not taking: No sig reported) 30 tablet 10    glucose blood (ACCU-CHEK GUIDE) test strip Use as instructed 100 each 12    omeprazole (PRILOSEC) 40 MG capsule Take 1 capsule (40 mg total) by mouth daily. 30 capsule 1    Prenatal Vit-Fe Fumarate-FA (PREPLUS) 27-1 MG TABS Take 1 tablet by mouth daily. 30 tablet 13 Unknown   promethazine (PHENERGAN) 25 MG tablet Take 1 tablet (25 mg total) by mouth every 6 (six) hours as needed for nausea or vomiting. (Patient not taking: No sig reported) 30 tablet 1    pyridOXINE (VITAMIN B-6) 50 MG tablet Take 1 tablet (50 mg total) by mouth daily. (Patient not taking: No sig  reported) 30 tablet 3    senna-docusate (SENOKOT-S) 8.6-50 MG tablet Take 1 tablet by mouth daily. (Patient not taking: No sig reported) 30 tablet 3     Review of Systems  Constitutional:  Positive for chills. Negative for fever.  HENT:  Positive for congestion and sore throat. Negative for ear pain.   Eyes: Negative.   Respiratory:  Positive for cough. Negative for shortness of breath and wheezing.   Cardiovascular: Negative.   Gastrointestinal: Negative.   Genitourinary: Negative.   Musculoskeletal:  Positive for myalgias.  Neurological:  Positive for headaches.  Physical Exam   Blood pressure 99/65, pulse (!) 110, temperature 99.2 F (37.3 C), temperature source Oral, resp. rate 16, weight 59.4 kg, last menstrual period 07/05/2020, SpO2 100 %.  Physical Exam Vitals and nursing note reviewed.  Constitutional:      General: She is not in acute distress.    Appearance: She is well-developed. She is ill-appearing. She is not toxic-appearing.  HENT:     Head: Normocephalic and atraumatic.   Eyes:     General: No scleral icterus.    Pupils: Pupils are equal, round, and reactive to light.  Cardiovascular:     Rate and Rhythm: Regular rhythm. Tachycardia present.     Heart sounds: Normal heart sounds.  Pulmonary:     Effort: Pulmonary effort is normal. No respiratory distress.     Breath sounds: Normal breath sounds. No wheezing.  Skin:    General: Skin is warm and dry.  Neurological:     Mental Status: She is alert.  Psychiatric:        Mood and Affect: Mood normal.        Behavior: Behavior normal.   NST:  Baseline: 155 bpm, Variability: Good {> 6 bpm), Accelerations: Reactive, and Decelerations: Absent  MAU Course  Procedures Results for orders placed or performed during the hospital encounter of 02/08/21 (from the past 24 hour(s))  Urinalysis, Routine w reflex microscopic Urine, Clean Catch     Status: Abnormal   Collection Time: 02/08/21  3:12 PM  Result Value Ref Range   Color, Urine YELLOW YELLOW   APPearance CLOUDY (A) CLEAR   Specific Gravity, Urine 1.020 1.005 - 1.030   pH 6.0 5.0 - 8.0   Glucose, UA NEGATIVE NEGATIVE mg/dL   Hgb urine dipstick NEGATIVE NEGATIVE   Bilirubin Urine NEGATIVE NEGATIVE   Ketones, ur 80 (A) NEGATIVE mg/dL   Protein, ur NEGATIVE NEGATIVE mg/dL   Nitrite NEGATIVE NEGATIVE   Leukocytes,Ua LARGE (A) NEGATIVE   RBC / HPF 0-5 0 - 5 RBC/hpf   WBC, UA 21-50 0 - 5 WBC/hpf   Bacteria, UA RARE (A) NONE SEEN   Squamous Epithelial / LPF 11-20 0 - 5   Mucus PRESENT   Resp Panel by RT-PCR (Flu A&B, Covid) Nasopharyngeal Swab     Status: Abnormal   Collection Time: 02/08/21  3:15 PM   Specimen: Nasopharyngeal Swab; Nasopharyngeal(NP) swabs in vial transport medium  Result Value Ref Range   SARS Coronavirus 2 by RT PCR NEGATIVE NEGATIVE   Influenza A by PCR POSITIVE (A) NEGATIVE   Influenza B by PCR NEGATIVE NEGATIVE  Glucose, capillary     Status: None   Collection Time: 02/08/21  3:56 PM  Result Value Ref Range   Glucose-Capillary  80 70 - 99 mg/dL   Comment 1 Notify RN    Comment 2 Document in Chart     MDM Patient presents with symptoms of viral illness.  Offered IV fluids & tylenol. Patient declined IV fluids & states she would like to be discharged as soon as she can to get home to her other children.  Reactive fetal tracing & vital signs stable. Covid/flu swab pending. Patient given tylenol prior to discharge.   *Video Arabic interpreter used for this encounter*  Right after patient left, result shows patient has Flu A. Patient was notified & tamiflu was prescribed to her  Assessment and Plan   1. Influenza A   2. Influenza-like illness   3. [redacted] weeks gestation of pregnancy    -Rx tamiflu -reviewed symptomatic treatment at home & given list of OTC meds safe in pregnancy (pt reports her husband reads [place]) -reviewed reasons to return to MAU  Judeth Horn 02/08/2021, 3:20 PM

## 2021-02-08 NOTE — Discharge Instructions (Signed)

## 2021-02-08 NOTE — MAU Note (Signed)
Presents stating she has a H/A, chills, cough, sore throat, and body aches since yesterday.  States hasn't taken any meds to treat symptoms. Denies VB or LOF.  Reports +FM.

## 2021-02-09 ENCOUNTER — Encounter: Payer: 59 | Admitting: Obstetrics & Gynecology

## 2021-02-12 ENCOUNTER — Encounter: Payer: 59 | Admitting: Registered"

## 2021-02-12 ENCOUNTER — Telehealth (INDEPENDENT_AMBULATORY_CARE_PROVIDER_SITE_OTHER): Payer: 59 | Admitting: Registered"

## 2021-02-12 DIAGNOSIS — Z3A00 Weeks of gestation of pregnancy not specified: Secondary | ICD-10-CM

## 2021-02-12 DIAGNOSIS — O24410 Gestational diabetes mellitus in pregnancy, diet controlled: Secondary | ICD-10-CM

## 2021-02-12 NOTE — Progress Notes (Signed)
Virtual Visit via Video Note  I connected with Kelsey Ramirez on 02/12/21 at  8:15 AM EST by a video enabled telemedicine application and verified that I am speaking with the correct person using two identifiers.  Location: Patient: car with husband driving and child Provider: Therapist, music for [facility] [place]  Armed forces training and education officer from [employer].  Patient did not understand that the visit type was video and arrived at clinic. Pt reports she is still sick was asked to go to her car and use the link sent to phone to proceed with video visit. Patient agreed and also agreed to follow up visit via MyChart.   I discussed the limitations of evaluation and management by telemedicine and the availability of in person appointments. The patient expressed understanding and agreed to proceed.  History of Present Illness: Patinet was seen for initial gestational diabetes visit on 02/05/21. Patient began checking blood sugar and returns for follow-up and continued education.  Patient c/o difficulty sleeping and also she is afraid to eat because she doesn't understand what foods she should avoid or how many carbohydrates she should eat. Pt states the one value that was over 120 (135) she ate a banana and some candy.  FBS: 85-93 mg/dL  PPBG: WNL except 1 value    Follow Up Instructions: Continue checking blood sugar and bring log sheet to all appointments. If you have questions about specific foods you are eating, be prepared to have the containers available at your next visit. Review handout from first visit, especially the meal plan and table with carbohydrates listed with serving sizes that equal 1 15 gram serving. monitor glucose levels, goals: FBS: 60 - 95 mg/dl 2 hour: <194 mg/dl   I discussed the assessment and treatment plan with the patient. The patient was provided an opportunity to ask questions and all were answered. The patient agreed with the plan and demonstrated an  understanding of the instructions.   The patient was advised to call back or seek an in-person evaluation if the symptoms worsen or if the condition fails to improve as anticipated.  I provided 35 minutes of non-face-to-face time during this encounter.   Heywood Bene, RD, LDN, CDCES

## 2021-02-13 ENCOUNTER — Other Ambulatory Visit: Payer: Self-pay

## 2021-02-13 ENCOUNTER — Inpatient Hospital Stay (EMERGENCY_DEPARTMENT_HOSPITAL)
Admission: AD | Admit: 2021-02-13 | Discharge: 2021-02-13 | Disposition: A | Discharge status: Home / Self Care | Payer: 59 | Source: Home / Self Care | Attending: Family Medicine | Admitting: Family Medicine

## 2021-02-13 ENCOUNTER — Encounter (HOSPITAL_COMMUNITY): Payer: Self-pay | Admitting: Family Medicine

## 2021-02-13 DIAGNOSIS — O26893 Other specified pregnancy related conditions, third trimester: Secondary | ICD-10-CM | POA: Diagnosis not present

## 2021-02-13 DIAGNOSIS — Z6790 Unspecified blood type, Rh positive: Secondary | ICD-10-CM | POA: Diagnosis not present

## 2021-02-13 DIAGNOSIS — N898 Other specified noninflammatory disorders of vagina: Secondary | ICD-10-CM

## 2021-02-13 DIAGNOSIS — Z3A32 32 weeks gestation of pregnancy: Secondary | ICD-10-CM | POA: Diagnosis not present

## 2021-02-13 DIAGNOSIS — O09299 Supervision of pregnancy with other poor reproductive or obstetric history, unspecified trimester: Secondary | ICD-10-CM | POA: Diagnosis not present

## 2021-02-13 DIAGNOSIS — O4693 Antepartum hemorrhage, unspecified, third trimester: Secondary | ICD-10-CM | POA: Insufficient documentation

## 2021-02-13 DIAGNOSIS — O43199 Other malformation of placenta, unspecified trimester: Secondary | ICD-10-CM

## 2021-02-13 DIAGNOSIS — O0990 Supervision of high risk pregnancy, unspecified, unspecified trimester: Secondary | ICD-10-CM

## 2021-02-13 DIAGNOSIS — Z3A31 31 weeks gestation of pregnancy: Secondary | ICD-10-CM | POA: Insufficient documentation

## 2021-02-13 DIAGNOSIS — O09521 Supervision of elderly multigravida, first trimester: Secondary | ICD-10-CM

## 2021-02-13 DIAGNOSIS — O34219 Maternal care for unspecified type scar from previous cesarean delivery: Secondary | ICD-10-CM | POA: Insufficient documentation

## 2021-02-13 LAB — URINALYSIS, MICROSCOPIC (REFLEX)

## 2021-02-13 LAB — URINALYSIS, ROUTINE W REFLEX MICROSCOPIC
Bilirubin Urine: NEGATIVE
Glucose, UA: NEGATIVE mg/dL
Ketones, ur: 40 mg/dL — AB
Nitrite: NEGATIVE
Protein, ur: NEGATIVE mg/dL
Specific Gravity, Urine: 1.015 (ref 1.005–1.030)
pH: 7 (ref 5.0–8.0)

## 2021-02-13 LAB — GLUCOSE, CAPILLARY: Glucose-Capillary: 82 mg/dL (ref 70–99)

## 2021-02-13 NOTE — MAU Note (Signed)
Presents stating she noted a small amount of VB in her discharge last night prior to going to bed.  Also states noted scant amount blood in urine stream.  Endorses +FM.  Denies LOF.

## 2021-02-13 NOTE — MAU Provider Note (Signed)
History     CSN: [id number]  Arrival date and time: 02/13/21 0840   None     Chief Complaint  Patient presents with   Vaginal Bleeding   HPI Kelsey Ramirez is a 35 y.o. [id number] at [redacted]w[redacted]d who presents to MAU with chief complaint of blood-tinged discharge. This is a new problem which occurred on one occasion last night. Patient states it happened again while she was providing a urine sample in MAU. She also endorses seeing small "flakes of blood" in her commode after voiding. She denies dysuria, overt vaginal bleeding, leaking of fluid, decreased fetal movement, fever, falls, or recent illness. She does not believe she has a hemorrhoid. She is remote from sexual intercourse.  Patient states she spent several hours cleaning her house, particularly her kitchen yesterday. She endorses feeling very tired afterwards.  Patient receives care with [facility].  OB History     Gravida  4   Para  2   Term  2   Preterm      AB  1   Living  2      SAB  1   IAB      Ectopic      Multiple  0   Live Births  2        Obstetric Comments  G1: in [place]. Pt states was elective due to female circ.          Past Medical History:  Diagnosis Date   Anemia    last pregnancy   Complication of anesthesia    Feels Epidural caused BP to drop   Female circumcision    Gestational diabetes    Menorrhagia 06/19/2020    Past Surgical History:  Procedure Laterality Date   CESAREAN SECTION     CESAREAN SECTION N/A 12/30/2016   Procedure: REPEAT CESAREAN SECTION;  Surgeon: Tilda Burrow, MD;  Location: [facility];  Service: Obstetrics;  Laterality: N/A;    Family History  Problem Relation Age of Onset   Cancer Neg Hx    Diabetes Neg Hx    Hypertension Neg Hx     Social History   Tobacco Use   Smoking status: Never   Smokeless tobacco: Never  Vaping Use   Vaping Use: Never used  Substance Use Topics   Alcohol use: No   Drug use: No    Allergies: No Known  Allergies  Medications Prior to Admission  Medication Sig Dispense Refill Last Dose   Accu-Chek Softclix Lancets lancets Use as instructed 100 each 12    Blood Pressure Monitoring DEVI 1 each by Does not apply route once a week. (Patient not taking: No sig reported) 1 each 0    docusate sodium (COLACE) 100 MG capsule Take 1 capsule (100 mg total) by mouth 3 (three) times daily. 10 capsule 0    glucose blood (ACCU-CHEK GUIDE) test strip Use as instructed 100 each 12    omeprazole (PRILOSEC) 40 MG capsule Take 1 capsule (40 mg total) by mouth daily. 30 capsule 1    oseltamivir (TAMIFLU) 75 MG capsule Take 1 capsule (75 mg total) by mouth 2 (two) times daily for 5 days. 10 capsule 0    Prenatal Vit-Fe Fumarate-FA (PREPLUS) 27-1 MG TABS Take 1 tablet by mouth daily. 30 tablet 13     Review of Systems  Genitourinary:  Positive for vaginal bleeding.       Uncertain vaginal bleeding vs hematuria per patient  All other systems reviewed and  are negative. Physical Exam   Blood pressure 106/63, pulse 92, resp. rate 20, weight 58.8 kg, last menstrual period 07/05/2020, SpO2 100 %.  Physical Exam Vitals and nursing note reviewed. Exam conducted with a chaperone present.  Constitutional:      Appearance: Normal appearance.  Cardiovascular:     Pulses: Normal pulses.  Pulmonary:     Effort: Pulmonary effort is normal.     Breath sounds: Normal breath sounds.  Abdominal:     Comments: gravid  Skin:    Capillary Refill: Capillary refill takes less than 2 seconds.  Neurological:     Mental Status: She is alert and oriented to person, place, and time.  Psychiatric:        Mood and Affect: Mood normal.        Behavior: Behavior normal.        Thought Content: Thought content normal.        Judgment: Judgment normal.    MAU Course  Procedures  --Hx cesarean x 2, Pregnancy complicated by [id number] --Lengthy discussion at bedside regarding possible causes for blood-tinged discharge in third  trimester --Patient declines pelvic exam to assess for vaginal bleeding --Patient declines visual inspection of perineum to assess for blood on labia, possible hemorrhoid --Patient declines CBC to assess amount of bleeding --Declines IV fluid bolus for toco activity --Discussed with patient that MAU staff will not perform any assessments or procedures on her person without her express consent, but that respecting those boundaries also puts constraints on our ability to accurately diagnose the cause of her blood-tinged discharge. Pt verbalizes understanding.  --Reactive tracing: baseline 140, mod var,+ accels, no decels --Toco: rare contraction, UI noted --Abnormal UA. Rare bacteria documented. No urinary complaints but will send for culture.  Patient Vitals for the past 24 hrs:  BP Temp Temp src Pulse Resp SpO2 Weight  02/13/21 1048 (!) 92/51 98.9 F (37.2 C) Oral 93 17 100 % --  02/13/21 0925 112/68 98.4 F (36.9 C) Oral 95 17 100 % --  02/13/21 0851 106/63 -- Oral 92 20 100 % 58.8 kg   Results for orders placed or performed during the hospital encounter of 02/13/21 (from the past 24 hour(s))  Urinalysis, Routine w reflex microscopic Urine, Clean Catch     Status: Abnormal   Collection Time: 02/13/21  9:21 AM  Result Value Ref Range   Color, Urine YELLOW YELLOW   APPearance HAZY (A) CLEAR   Specific Gravity, Urine 1.015 1.005 - 1.030   pH 7.0 5.0 - 8.0   Glucose, UA NEGATIVE NEGATIVE mg/dL   Hgb urine dipstick LARGE (A) NEGATIVE   Bilirubin Urine NEGATIVE NEGATIVE   Ketones, ur 40 (A) NEGATIVE mg/dL   Protein, ur NEGATIVE NEGATIVE mg/dL   Nitrite NEGATIVE NEGATIVE   Leukocytes,Ua SMALL (A) NEGATIVE  Urinalysis, Microscopic (reflex)     Status: Abnormal   Collection Time: 02/13/21  9:21 AM  Result Value Ref Range   RBC / HPF 0-5 0 - 5 RBC/hpf   WBC, UA 6-10 0 - 5 WBC/hpf   Bacteria, UA RARE (A) NONE SEEN   Squamous Epithelial / LPF 0-5 0 - 5   Non Squamous Epithelial PRESENT  (A) NONE SEEN   Mucus PRESENT   Glucose, capillary     Status: None   Collection Time: 02/13/21 10:03 AM  Result Value Ref Range   Glucose-Capillary 82 70 - 99 mg/dL   Assessment and Plan  --35 y.o. [id number] at [redacted]w[redacted]d  --Blood-tinged discharge --  Reactive tracing --Amended physical exam per patient cultural preferences --Urine culture in work --Language barrier: remote interpreter utilized for all patient interaction --Discharge home in stable condition  Darlina Rumpf, CNM 02/13/2021, 6:03 PM

## 2021-02-14 ENCOUNTER — Encounter (HOSPITAL_COMMUNITY): Payer: Self-pay | Admitting: Family Medicine

## 2021-02-14 ENCOUNTER — Inpatient Hospital Stay (HOSPITAL_BASED_OUTPATIENT_CLINIC_OR_DEPARTMENT_OTHER): Payer: 59

## 2021-02-14 ENCOUNTER — Inpatient Hospital Stay (HOSPITAL_COMMUNITY)
Admission: AD | Admit: 2021-02-14 | Discharge: 2021-02-19 | DRG: 833 | Disposition: A | Discharge status: Home / Self Care | Payer: 59 | Attending: Obstetrics & Gynecology | Admitting: Obstetrics & Gynecology

## 2021-02-14 DIAGNOSIS — Z6790 Unspecified blood type, Rh positive: Secondary | ICD-10-CM

## 2021-02-14 DIAGNOSIS — Z789 Other specified health status: Secondary | ICD-10-CM | POA: Diagnosis present

## 2021-02-14 DIAGNOSIS — O99013 Anemia complicating pregnancy, third trimester: Secondary | ICD-10-CM | POA: Diagnosis present

## 2021-02-14 DIAGNOSIS — O43193 Other malformation of placenta, third trimester: Secondary | ICD-10-CM

## 2021-02-14 DIAGNOSIS — O43123 Velamentous insertion of umbilical cord, third trimester: Secondary | ICD-10-CM | POA: Diagnosis present

## 2021-02-14 DIAGNOSIS — N841 Polyp of cervix uteri: Secondary | ICD-10-CM | POA: Diagnosis present

## 2021-02-14 DIAGNOSIS — O3443 Maternal care for other abnormalities of cervix, third trimester: Secondary | ICD-10-CM | POA: Diagnosis present

## 2021-02-14 DIAGNOSIS — O43199 Other malformation of placenta, unspecified trimester: Secondary | ICD-10-CM | POA: Diagnosis present

## 2021-02-14 DIAGNOSIS — Z3A32 32 weeks gestation of pregnancy: Secondary | ICD-10-CM | POA: Diagnosis not present

## 2021-02-14 DIAGNOSIS — O0990 Supervision of high risk pregnancy, unspecified, unspecified trimester: Secondary | ICD-10-CM

## 2021-02-14 DIAGNOSIS — O34219 Maternal care for unspecified type scar from previous cesarean delivery: Secondary | ICD-10-CM | POA: Diagnosis present

## 2021-02-14 DIAGNOSIS — O4693 Antepartum hemorrhage, unspecified, third trimester: Secondary | ICD-10-CM

## 2021-02-14 DIAGNOSIS — Z758 Other problems related to medical facilities and other health care: Secondary | ICD-10-CM | POA: Diagnosis present

## 2021-02-14 DIAGNOSIS — O4690 Antepartum hemorrhage, unspecified, unspecified trimester: Secondary | ICD-10-CM

## 2021-02-14 DIAGNOSIS — O24419 Gestational diabetes mellitus in pregnancy, unspecified control: Secondary | ICD-10-CM | POA: Diagnosis not present

## 2021-02-14 DIAGNOSIS — O09523 Supervision of elderly multigravida, third trimester: Secondary | ICD-10-CM | POA: Diagnosis not present

## 2021-02-14 DIAGNOSIS — O24410 Gestational diabetes mellitus in pregnancy, diet controlled: Secondary | ICD-10-CM | POA: Diagnosis present

## 2021-02-14 DIAGNOSIS — N90810 Female genital mutilation status, unspecified: Secondary | ICD-10-CM | POA: Diagnosis present

## 2021-02-14 DIAGNOSIS — D509 Iron deficiency anemia, unspecified: Secondary | ICD-10-CM | POA: Diagnosis present

## 2021-02-14 DIAGNOSIS — Z20822 Contact with and (suspected) exposure to covid-19: Secondary | ICD-10-CM | POA: Diagnosis present

## 2021-02-14 DIAGNOSIS — Z3689 Encounter for other specified antenatal screening: Secondary | ICD-10-CM

## 2021-02-14 DIAGNOSIS — O09521 Supervision of elderly multigravida, first trimester: Secondary | ICD-10-CM | POA: Diagnosis present

## 2021-02-14 LAB — COMPREHENSIVE METABOLIC PANEL
ALT: 16 U/L (ref 0–44)
AST: 23 U/L (ref 15–41)
Albumin: 2.5 g/dL — ABNORMAL LOW (ref 3.5–5.0)
Alkaline Phosphatase: 82 U/L (ref 38–126)
Anion gap: 10 (ref 5–15)
BUN: <5 mg/dL — ABNORMAL LOW (ref 6–20)
CO2: 16 mmol/L — ABNORMAL LOW (ref 22–32)
Calcium: 8.1 mg/dL — ABNORMAL LOW (ref 8.9–10.3)
Chloride: 108 mmol/L (ref 98–111)
Creatinine, Ser: 0.46 mg/dL (ref 0.44–1.00)
GFR, Estimated: >60 mL/min (ref >60)
Glucose, Bld: 79 mg/dL (ref 70–99)
Potassium: 3.4 mmol/L — ABNORMAL LOW (ref 3.5–5.1)
Sodium: 134 mmol/L — ABNORMAL LOW (ref 135–145)
Total Bilirubin: 0.5 mg/dL (ref 0.3–1.2)
Total Protein: 5.7 g/dL — ABNORMAL LOW (ref 6.5–8.1)

## 2021-02-14 LAB — RESP PANEL BY RT-PCR (FLU A&B, COVID) ARPGX2
Influenza A by PCR: POSITIVE — AB
Influenza B by PCR: NEGATIVE
SARS Coronavirus 2 by RT PCR: NEGATIVE

## 2021-02-14 LAB — CBC
HCT: 32.4 % — ABNORMAL LOW (ref 36.0–46.0)
Hemoglobin: 10.6 g/dL — ABNORMAL LOW (ref 12.0–15.0)
MCH: 27.6 pg (ref 26.0–34.0)
MCHC: 32.7 g/dL (ref 30.0–36.0)
MCV: 84.4 fL (ref 80.0–100.0)
Platelets: 284 10*3/uL (ref 150–400)
RBC: 3.84 MIL/uL — ABNORMAL LOW (ref 3.87–5.11)
RDW: 14.7 % (ref 11.5–15.5)
WBC: 6.9 10*3/uL (ref 4.0–10.5)
nRBC: 0 % (ref 0.0–0.2)

## 2021-02-14 LAB — WET PREP, GENITAL
Clue Cells Wet Prep HPF POC: NONE SEEN
Sperm: NONE SEEN
Trich, Wet Prep: NONE SEEN
WBC, Wet Prep HPF POC: >=10 — AB (ref <10)
Yeast Wet Prep HPF POC: NONE SEEN

## 2021-02-14 LAB — CULTURE, OB URINE

## 2021-02-14 LAB — OB RESULTS CONSOLE GC/CHLAMYDIA: Gonorrhea: NEGATIVE

## 2021-02-14 LAB — GLUCOSE, CAPILLARY: Glucose-Capillary: 98 mg/dL (ref 70–99)

## 2021-02-14 MED ORDER — DOCUSATE SODIUM 100 MG PO CAPS
100.0000 mg | ORAL_CAPSULE | Freq: Every day | ORAL | Status: DC
  Administered 2021-02-14 – 2021-02-18 (×2): 100 mg via ORAL
  Filled 2021-02-14 (×5): qty 1

## 2021-02-14 MED ORDER — BETAMETHASONE SOD PHOS & ACET 6 (3-3) MG/ML IJ SUSP
12.0000 mg | INTRAMUSCULAR | Status: AC
  Administered 2021-02-14 – 2021-02-15 (×2): 12 mg via INTRAMUSCULAR
  Filled 2021-02-14 (×3): qty 5

## 2021-02-14 MED ORDER — LACTATED RINGERS IV SOLN: INTRAVENOUS | Status: DC

## 2021-02-14 MED ORDER — ACETAMINOPHEN 325 MG PO TABS: 650.0000 mg | ORAL_TABLET | ORAL | Status: DC | PRN

## 2021-02-14 MED ORDER — CALCIUM CARBONATE ANTACID 500 MG PO CHEW: 2.0000 | CHEWABLE_TABLET | ORAL | Status: DC | PRN

## 2021-02-14 MED ORDER — PRENATAL MULTIVITAMIN CH
1.0000 | ORAL_TABLET | Freq: Every day | ORAL | Status: DC
  Administered 2021-02-14 – 2021-02-19 (×5): 1 via ORAL
  Filled 2021-02-14 (×7): qty 1

## 2021-02-14 MED ORDER — LACTATED RINGERS IV BOLUS
1000.0000 mL | Freq: Once | INTRAVENOUS | Status: AC
  Administered 2021-02-14: 1000 mL via INTRAVENOUS

## 2021-02-14 MED ORDER — ZOLPIDEM TARTRATE 5 MG PO TABS: 5.0000 mg | ORAL_TABLET | Freq: Every evening | ORAL | Status: DC | PRN

## 2021-02-14 NOTE — MAU Provider Note (Deleted)
Duplicate note opened.  Marylen Ponto, NP  2:55 PM 02/14/2021

## 2021-02-14 NOTE — MAU Note (Addendum)
...  Kelsey Ramirez is a 35 y.o. at [redacted]w[redacted]d here in MAU reporting: in return from being evaluating yesterday, reports continue to bleed. Pt reports VB is not heavy, but every time she goes to the bathroom she see blood with wiping and discharge with blood when she pees. Pt states she is wearing a pad but mostly notices VB while peeing. Pt report feeling gas Pt denies ctx and cramping, LOF, DFM, and PIH s/s.    Pain score: 0/10 Vitals:   02/14/21 0633  BP: 108/71  Pulse: 98  Resp: 18  Temp: 98.2 F (36.8 C)  SpO2: 100%     FHT:145 Lab orders placed from triage: none

## 2021-02-14 NOTE — H&P (Signed)
History     CSN: [id number]  Arrival date and time: 02/14/21 0609   Event Date/Time   First Provider Initiated Contact with Patient 02/14/21 0741      Chief Complaint  Patient presents with   Vaginal Bleeding   Kelsey Ramirez is a 35 y.o. [id number] at [redacted]w[redacted]d who receives care at [facility].  She presents today for Vaginal Bleeding.  She states the bleeding started "the day before yesterday at night."  She reports the bleeding occurs with urination and noted in the toilet and on tissue.  Patient shows provider a picture of what appears to be a clot in the toilet.  She goes on to state that when she doesn't urinate she has some bleeding, but it is not as heavy.  She reports the color of the blood varies from light red to red.  Patient states she had 2 previous c/s and is concerned that her "uterus is tearing" and that is why she came in. She endorses fetal movement. She also reports pressure with fetal movement.  MDM PE-Limited Labs:CBC, CMP, UA EFM Ultrasound Start IV with LR  MAU Plan  35 year old [id number]  SIUP at 32 weeks Cat I FT Vaginal Bleeding Language Barrier  -Explained that when vaginal bleeding occurs the first step is to assess via a speculum exam. -Further educated on how [place] is not the gold standard for assessment of vaginal bleeding as it does not always reveal placental abruption even in context of vaginal bleeding.  -Interpretations completed with assistance of video interpreter: [id number] [id number]. -After extensive discussion, patient able to consent to vaginal exam.  However, with initiation of exam patient unable to tolerate and speculum exam deferred. -Cultures collected, but patient with extreme discomfort and cultures not inserted fully into vagina. Patient informed that results may be obscured d/t inadequate sample. -Discussed plan to start IV, collect labs, and give fluids. -Patient instructed not to eat. -Will also send for [place]. -Patient without further questions or  concerns. -Report to be given to oncoming provider, Farris Has, NP  Gerrit Heck MSN, CNM 02/14/2021, 8:41 AM   Social History   Tobacco Use   Smoking status: Never   Smokeless tobacco: Never  Vaping Use   Vaping Use: Never used  Substance Use Topics   Alcohol use: No   Drug use: No    Allergies: No Known Allergies  Medications Prior to Admission  Medication Sig Dispense Refill Last Dose   Prenatal Vit-Fe Fumarate-FA (PREPLUS) 27-1 MG TABS Take 1 tablet by mouth daily. 30 tablet 13 Past Week   Accu-Chek Softclix Lancets lancets Use as instructed 100 each 12    Blood Pressure Monitoring DEVI 1 each by Does not apply route once a week. (Patient not taking: Reported on 11/13/2020) 1 each 0    docusate sodium (COLACE) 100 MG capsule Take 1 capsule (100 mg total) by mouth 3 (three) times daily. 10 capsule 0    glucose blood (ACCU-CHEK GUIDE) test strip Use as instructed 100 each 12    omeprazole (PRILOSEC) 40 MG capsule Take 1 capsule (40 mg total) by mouth daily. 30 capsule 1    MAU Course   Results for orders placed or performed during the hospital encounter of 02/14/21 (from the past 24 hour(s))  Wet prep, genital     Status: Abnormal   Collection Time: 02/14/21  8:36 AM   Specimen: Vaginal  Result Value Ref Range   Yeast Wet Prep HPF POC NONE SEEN NONE SEEN  Trich, Wet Prep NONE SEEN NONE SEEN   Clue Cells Wet Prep HPF POC NONE SEEN NONE SEEN   WBC, Wet Prep HPF POC >=10 (A) <10   Sperm NONE SEEN   CBC     Status: Abnormal   Collection Time: 02/14/21  8:53 AM  Result Value Ref Range   WBC 6.9 4.0 - 10.5 K/uL   RBC 3.84 (L) 3.87 - 5.11 MIL/uL   Hemoglobin 10.6 (L) 12.0 - 15.0 g/dL   HCT 25.9 (L) 56.3 - 87.5 %   MCV 84.4 80.0 - 100.0 fL   MCH 27.6 26.0 - 34.0 pg   MCHC 32.7 30.0 - 36.0 g/dL   RDW 64.3 32.9 - 51.8 %   Platelets 284 150 - 400 K/uL   nRBC 0.0 0.0 - 0.2 %  Comprehensive metabolic panel     Status: Abnormal   Collection Time: 02/14/21  8:53 AM  Result  Value Ref Range   Sodium 134 (L) 135 - 145 mmol/L   Potassium 3.4 (L) 3.5 - 5.1 mmol/L   Chloride 108 98 - 111 mmol/L   CO2 16 (L) 22 - 32 mmol/L   Glucose, Bld 79 70 - 99 mg/dL   BUN <5 (L) 6 - 20 mg/dL   Creatinine, Ser 8.41 0.44 - 1.00 mg/dL   Calcium 8.1 (L) 8.9 - 10.3 mg/dL   Total Protein 5.7 (L) 6.5 - 8.1 g/dL   Albumin 2.5 (L) 3.5 - 5.0 g/dL   AST 23 15 - 41 U/L   ALT 16 0 - 44 U/L   Alkaline Phosphatase 82 38 - 126 U/L   Total Bilirubin 0.5 0.3 - 1.2 mg/dL   GFR, Estimated >66 >06 mL/min   Anion gap 10 5 - 15   No results found.  OB History     Gravida  4   Para  2   Term  2   Preterm      AB  1   Living  2      SAB  1   IAB      Ectopic      Multiple  0   Live Births  2        Obstetric Comments  G1: in [place]. Pt states was elective due to female circ.         Past Medical History:  Diagnosis Date   Anemia    last pregnancy   Complication of anesthesia    Feels Epidural caused BP to drop   Female circumcision    Gestational diabetes    Menorrhagia 06/19/2020   Past Surgical History:  Procedure Laterality Date   CESAREAN SECTION     CESAREAN SECTION N/A 12/30/2016   Procedure: REPEAT CESAREAN SECTION;  Surgeon: Tilda Burrow, MD;  Location: [facility];  Service: Obstetrics;  Laterality: N/A;   Family History: family history is not on file. Social History:  reports that she has never smoked. She has never used smokeless tobacco. She reports that she does not drink alcohol and does not use drugs.    Maternal Diabetes: Yes:  Diabetes Type:  Diet controlled Genetic Screening: Declined Maternal Ultrasounds/Referrals: Other: marginal cord Fetal Ultrasounds or other Referrals:  None Maternal Substance Abuse:  No Significant Maternal Medications:  Meds include: Other: Prilosec, Colace Significant Maternal Lab Results:  None Other Comments:   female circumcision , Arabic speaking (interpreter used for entire visit),  AMA  Review of Systems  Gastrointestinal:  Positive for  abdominal pain. Negative for diarrhea, nausea and vomiting.  Genitourinary:  Negative for difficulty urinating and dysuria.  Neurological:  Negative for dizziness, light-headedness and headaches.     Blood pressure (!) 90/42, pulse 85, temperature 98.3 F (36.8 C), temperature source Oral, resp. rate 16, height 5' (1.524 m), weight 57.6 kg, last menstrual period 07/05/2020, SpO2 99 %.  Patient Vitals for the past 24 hrs:  BP Temp Temp src Pulse Resp SpO2 Height Weight  02/14/21 1040 (!) 90/42 98.3 F (36.8 C) Oral 85 16 99 % -- --  02/14/21 0709 -- -- -- -- -- 100 % -- --  02/14/21 0701 112/70 -- -- 87 -- -- -- --  02/14/21 0658 107/68 -- -- 95 -- -- -- --  02/14/21 0633 108/71 98.2 F (36.8 C) Oral 98 18 100 % 5' (1.524 m) 57.6 kg   Physical Exam Vitals reviewed. Exam conducted with a chaperone present.  Constitutional:      Appearance: Normal appearance.  Eyes:     Conjunctiva/sclera: Conjunctivae normal.  Cardiovascular:     Rate and Rhythm: Normal rate.  Pulmonary:     Effort: Pulmonary effort is normal. No respiratory distress.  Abdominal:     General: Bowel sounds are normal.  Genitourinary:    Comments: Visual Exam performed. -Clitoris removed, but bleeding noted around area. -Introitus with brown discharge noted. -Cultures collected blindly. Patient with extreme discomfort with insertion of swabs. Removed and noted to have reddish brown discharge.  -BME deferred Musculoskeletal:        General: Normal range of motion.     Cervical back: Normal range of motion.  Skin:    General: Skin is warm and dry.  Neurological:     Mental Status: She is alert and oriented to person, place, and time.  Psychiatric:        Mood and Affect: Mood normal.        Behavior: Behavior normal.    Prenatal labs: ABO, Rh: O/Positive/-- (08/05 1013) Antibody: Negative (08/05 1013) Rubella: 1.96 (08/05 1013) RPR: Non Reactive  (11/17 0900)  HBsAg: Negative (08/05 1013)  HIV: Non Reactive (11/17 0900)  GBS:     Assessment/Plan: 1. Blood type, Rh positive   2. Vaginal bleeding in pregnancy, third trimester   3. [redacted] weeks gestation of pregnancy   4. NST (non-stress test) reactive   5. Cervical polyp    -after 4 attempts, pt tolerant of speculum exam. Minimal active bleeding noted coming from os with dark red blood and small clots noted on vaginal walls. Large cervical polyp noted at external os, covered in dark red blood/small clots presumably from os -circumcision with absent clitorus and abnormal other anatomy likely from surgical procedure, labia minora absent, introitus not sewn  Fetal Assessment 145 bpm, Mod Var, -Decels, +Accels Toco: Irritability  Admit to [facility] for observation  Odie Sera Lamaya Hyneman 02/14/2021, 2:48 PM

## 2021-02-15 LAB — GLUCOSE, CAPILLARY
Glucose-Capillary: 96 mg/dL (ref 70–99)
Glucose-Capillary: 82 mg/dL (ref 70–99)
Glucose-Capillary: 96 mg/dL (ref 70–99)
Glucose-Capillary: 103 mg/dL — ABNORMAL HIGH (ref 70–99)

## 2021-02-15 MED ORDER — HYDROCORT-PRAMOXINE (PERIANAL) 1-1 % EX FOAM
1.0000 | Freq: Two times a day (BID) | CUTANEOUS | Status: DC
  Administered 2021-02-15 – 2021-02-19 (×5): 1 via RECTAL
  Filled 2021-02-15 (×4): qty 10

## 2021-02-15 NOTE — Progress Notes (Signed)
Patient ID: Kelsey Ramirez, female   DOB: 11-30-1985, 35 y.o.   MRN: [id number] FACULTY PRACTICE ANTEPARTUM(COMPREHENSIVE) NOTE  Kelsey Ramirez is a 35 y.o. [id number] with Estimated Date of Delivery: 04/11/21   By  early ultrasound [redacted]w[redacted]d  who is admitted for 3rd trimester bleeding.    Fetal presentation is cephalic. Length of Stay:  1  Days  Date of admission:02/14/2021  Subjective: Minimal staining since admission Patient reports the fetal movement as active. Patient reports uterine contraction  activity as none. Patient reports  vaginal bleeding as none. Patient describes fluid per vagina as None.  Vitals:  Blood pressure 104/61, pulse 77, temperature 98 F (36.7 C), temperature source Oral, resp. rate 16, height 5' (1.524 m), weight 57.6 kg, last menstrual period 07/05/2020, SpO2 99 %. Vitals:   02/14/21 2029 02/14/21 2310 02/15/21 0350 02/15/21 0741  BP: (!) 102/57 (!) 101/53 110/67 104/61  Pulse: 98 94 96 77  Resp: 16     Temp: 97.8 F (36.6 C) 98.5 F (36.9 C) 98.4 F (36.9 C) 98 F (36.7 C)  TempSrc: Oral Oral Oral Oral  SpO2: 100% 100% 99%   Weight:      Height:       Physical Examination:  General appearance - alert, well appearing, and in no distress Abdomen - soft, nontender, nondistended, no masses or organomegaly Fundal Height:  size equals dates Pelvic Exam:  examination not indicated Cervical Exam: Not evaluated. Extremities: extremities normal, atraumatic, no cyanosis or edema with DTRs 2+ bilaterally Membranes:intact  Fetal Monitoring:  Baseline: 140s bpm, Variability: Good {> 6 bpm), Accelerations: Reactive, and Decelerations: Absent   reactive  Labs:  Results for orders placed or performed during the hospital encounter of 02/14/21 (from the past 24 hour(s))  Wet prep, genital   Collection Time: 02/14/21  8:36 AM   Specimen: Vaginal  Result Value Ref Range   Yeast Wet Prep HPF POC NONE SEEN NONE SEEN   Trich, Wet Prep NONE SEEN NONE SEEN   Clue Cells Wet Prep  HPF POC NONE SEEN NONE SEEN   WBC, Wet Prep HPF POC >=10 (A) <10   Sperm NONE SEEN   CBC   Collection Time: 02/14/21  8:53 AM  Result Value Ref Range   WBC 6.9 4.0 - 10.5 K/uL   RBC 3.84 (L) 3.87 - 5.11 MIL/uL   Hemoglobin 10.6 (L) 12.0 - 15.0 g/dL   HCT 14.7 (L) 82.9 - 56.2 %   MCV 84.4 80.0 - 100.0 fL   MCH 27.6 26.0 - 34.0 pg   MCHC 32.7 30.0 - 36.0 g/dL   RDW 13.0 86.5 - 78.4 %   Platelets 284 150 - 400 K/uL   nRBC 0.0 0.0 - 0.2 %  Comprehensive metabolic panel   Collection Time: 02/14/21  8:53 AM  Result Value Ref Range   Sodium 134 (L) 135 - 145 mmol/L   Potassium 3.4 (L) 3.5 - 5.1 mmol/L   Chloride 108 98 - 111 mmol/L   CO2 16 (L) 22 - 32 mmol/L   Glucose, Bld 79 70 - 99 mg/dL   BUN <5 (L) 6 - 20 mg/dL   Creatinine, Ser 6.96 0.44 - 1.00 mg/dL   Calcium 8.1 (L) 8.9 - 10.3 mg/dL   Total Protein 5.7 (L) 6.5 - 8.1 g/dL   Albumin 2.5 (L) 3.5 - 5.0 g/dL   AST 23 15 - 41 U/L   ALT 16 0 - 44 U/L   Alkaline Phosphatase 82 38 - 126 U/L  Total Bilirubin 0.5 0.3 - 1.2 mg/dL   GFR, Estimated >41 >96 mL/min   Anion gap 10 5 - 15  Resp Panel by RT-PCR (Flu A&B, Covid) Nasopharyngeal Swab   Collection Time: 02/14/21  2:58 PM   Specimen: Nasopharyngeal Swab; Nasopharyngeal(NP) swabs in vial transport medium  Result Value Ref Range   SARS Coronavirus 2 by RT PCR NEGATIVE NEGATIVE   Influenza A by PCR POSITIVE (A) NEGATIVE   Influenza B by PCR NEGATIVE NEGATIVE  Glucose, capillary   Collection Time: 02/14/21  6:38 PM  Result Value Ref Range   Glucose-Capillary 98 70 - 99 mg/dL  Glucose, capillary   Collection Time: 02/15/21  1:48 AM  Result Value Ref Range   Glucose-Capillary 96 70 - 99 mg/dL  Glucose, capillary   Collection Time: 02/15/21  5:11 AM  Result Value Ref Range   Glucose-Capillary 82 70 - 99 mg/dL    Imaging Studies:    [place] MFM OB LIMITED  Result Date: 02/14/2021 ----------------------------------------------------------------------  OBSTETRICS REPORT                         (Signed Final 02/14/2021 08:44 pm) ---------------------------------------------------------------------- Patient Info  ID #:       [id number]                          D.O.B.:  Dec 05, 1985 (35 yrs)  Name:       Kelsey Ramirez                    Visit Date: 02/14/2021 09:27 am ---------------------------------------------------------------------- Performed By  Attending:        Lin Landsman      Secondary Phy.:    Gerrit Heck                    MD                                                              CNM  Performed By:     Birdena Crandall        Address:           Faculty Practice                    RDMS,RVT  Referred By:      Isa Rankin         [facility]  Ref. Address:     [address] ---------------------------------------------------------------------- Orders  #  Description                           Code        Ordered By  1  [place] MFM  OB LIMITED                     [id number]    JESSICA EMLY ----------------------------------------------------------------------  #  Order #                     Accession #                Episode #  1  [id number]                   [id number]                 [id number] ---------------------------------------------------------------------- Indications  Vaginal bleeding in pregnancy, third trimester  O46.93  Marginal insertion of umbilical cord affecting  O43.193  management of mother in third trimester  Gestational diabetes in pregnancy,              O24.419  unspecified control  Advanced maternal age multigravida 108+,         O21.523  third trimester  History of cesarean delivery, currently         O34.219  pregnant X2  Declined Horizon/Panorama  Encounter for other antenatal screening         Z36.2  follow-up  [redacted] weeks gestation of pregnancy                 Z3A.32 ----------------------------------------------------------------------  Fetal Evaluation  Num Of Fetuses:          1  Fetal Heart Rate(bpm):   164  Cardiac Activity:        Observed  Presentation:            Cephalic  Placenta:                Anterior Fundal  P. Cord Insertion:       Marginal insertion, prev seen  Amniotic Fluid  AFI FV:      Within normal limits  AFI Sum(cm)     %Tile       Largest Pocket(cm)  12.9            38          4.2  RUQ(cm)       RLQ(cm)       LUQ(cm)        LLQ(cm)  3.7           3             2               4.2  Comment:    No placental abruption or previa identified. ---------------------------------------------------------------------- OB History  Gravidity:    4         Term:   2         SAB:   1  Living:       2 ---------------------------------------------------------------------- Gestational Age  LMP:           32w 0d        Date:  07/05/20                 EDD:   04/11/21  Best:          [name] 0d     Det. By:  LMP  (07/05/20)          EDD:   04/11/21 ---------------------------------------------------------------------- Anatomy  Cranium:               Appears normal  Cord Vessels:           Appears normal (3                                                                        vessel cord)  Ventricles:            Appears normal         Kidneys:                Appear normal  Diaphragm:             Appears normal         Bladder:                Appears normal  Stomach:               Appears normal, left                         sided ---------------------------------------------------------------------- Cervix Uterus Adnexa  Cervix  Length:              6  cm.  Normal appearance by transabdominal scan.  Uterus  No abnormality visualized. ---------------------------------------------------------------------- Impression  Limited exam to due to maternal vaginal bleeding  Good fetal movement and amniotic fluid.  Known marginal cord insertion.  No evidence placental abruption or previa  ---------------------------------------------------------------------- Recommendations  Clinical correlation recommended. ----------------------------------------------------------------------               Lin Landsman, MD Electronically Signed Final Report   02/14/2021 08:44 pm ----------------------------------------------------------------------    Medications:  Scheduled  betamethasone acetate-betamethasone sodium phosphate  12 mg Intramuscular Q24H   docusate sodium  100 mg Oral Daily   prenatal multivitamin  1 tablet Oral Q1200   I have reviewed the patient's current medications.  ASSESSMENT: [id number] [redacted]w[redacted]d Estimated Date of Delivery: 04/11/21  Patient Active Problem List   Diagnosis Date Noted   Cervical polyp 02/14/2021   Vaginal bleeding during pregnancy 02/14/2021   Gestational diabetes mellitus (GDM) 01/23/2021   Marginal insertion of umbilical cord affecting management of mother 11/21/2020   AMA (advanced maternal age) multigravida 35+, first trimester 10/10/2020   Complication of anesthesia 08/22/2020   Supervision of high risk pregnancy, antepartum 08/22/2020   History of miscarriage, currently pregnant 04/30/2018   Language barrier affecting health care 06/23/2016   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

## 2021-02-16 DIAGNOSIS — Z3A32 32 weeks gestation of pregnancy: Secondary | ICD-10-CM

## 2021-02-16 LAB — COMPREHENSIVE METABOLIC PANEL
ALT: 13 U/L (ref 0–44)
AST: 17 U/L (ref 15–41)
Albumin: 2.3 g/dL — ABNORMAL LOW (ref 3.5–5.0)
Alkaline Phosphatase: 87 U/L (ref 38–126)
Anion gap: 8 (ref 5–15)
BUN: 5 mg/dL — ABNORMAL LOW (ref 6–20)
CO2: 19 mmol/L — ABNORMAL LOW (ref 22–32)
Calcium: 8.1 mg/dL — ABNORMAL LOW (ref 8.9–10.3)
Chloride: 108 mmol/L (ref 98–111)
Creatinine, Ser: 0.41 mg/dL — ABNORMAL LOW (ref 0.44–1.00)
GFR, Estimated: >60 mL/min (ref >60)
Glucose, Bld: 134 mg/dL — ABNORMAL HIGH (ref 70–99)
Potassium: 3.3 mmol/L — ABNORMAL LOW (ref 3.5–5.1)
Sodium: 135 mmol/L (ref 135–145)
Total Bilirubin: 0.4 mg/dL (ref 0.3–1.2)
Total Protein: 5.2 g/dL — ABNORMAL LOW (ref 6.5–8.1)

## 2021-02-16 LAB — TYPE AND SCREEN
ABO/RH(D): O POS
Antibody Screen: NEGATIVE

## 2021-02-16 LAB — GLUCOSE, CAPILLARY
Glucose-Capillary: 107 mg/dL — ABNORMAL HIGH (ref 70–99)
Glucose-Capillary: 123 mg/dL — ABNORMAL HIGH (ref 70–99)
Glucose-Capillary: 123 mg/dL — ABNORMAL HIGH (ref 70–99)
Glucose-Capillary: 94 mg/dL (ref 70–99)
Glucose-Capillary: 95 mg/dL (ref 70–99)
Glucose-Capillary: 99 mg/dL (ref 70–99)

## 2021-02-16 LAB — CBC
HCT: 30 % — ABNORMAL LOW (ref 36.0–46.0)
Hemoglobin: 9.5 g/dL — ABNORMAL LOW (ref 12.0–15.0)
MCH: 27 pg (ref 26.0–34.0)
MCHC: 31.7 g/dL (ref 30.0–36.0)
MCV: 85.2 fL (ref 80.0–100.0)
Platelets: 299 K/uL (ref 150–400)
RBC: 3.52 MIL/uL — ABNORMAL LOW (ref 3.87–5.11)
RDW: 15 % (ref 11.5–15.5)
WBC: 8.5 K/uL (ref 4.0–10.5)
nRBC: 0.2 % (ref 0.0–0.2)

## 2021-02-16 LAB — GC/CHLAMYDIA PROBE AMP (~~LOC~~) NOT AT ARMC
Chlamydia: NEGATIVE
Comment: NEGATIVE
Comment: NORMAL
Neisseria Gonorrhea: NEGATIVE

## 2021-02-16 NOTE — Progress Notes (Signed)
Patient ID: Kelsey Ramirez, female   DOB: Aug 04, 1985, 35 y.o.   MRN: [id number] FACULTY PRACTICE ANTEPARTUM(COMPREHENSIVE) NOTE  Kelsey Ramirez is a 35 y.o. [id number] at [redacted]w[redacted]d  who is admitted for 3rd trimester bleeding.    Fetal presentation is cephalic. Length of Stay:  2  Days  Date of admission:02/14/2021  [facility] Arabic interpreter present for encounter.  Subjective: No new bleeding since admission. Patient reports the fetal movement as active. Patient reports uterine contraction  activity as none. Patient reports  vaginal bleeding as none. Patient describes fluid per vagina as none.  Vitals:  Blood pressure (!) 100/56, pulse 80, temperature 98 F (36.7 C), temperature source Oral, resp. rate 18, height 5' (1.524 m), weight 57.6 kg, last menstrual period 07/05/2020, SpO2 100 %. Vitals:   02/15/21 1535 02/15/21 1940 02/16/21 0426 02/16/21 0756  BP: 106/63 (!) 98/57 (!) 102/59 (!) 100/56  Pulse: 96 99 80 80  Resp: 16 18 18 18   Temp: 97.6 F (36.4 C) 97.9 F (36.6 C) 98 F (36.7 C) 98 F (36.7 C)  TempSrc: Oral Oral Oral Oral  SpO2: 100% 100% 100% 100%  Weight:      Height:       Physical Examination: General appearance - alert, well appearing, and in no distress Abdomen - soft, nontender, nondistended, no masses or organomegaly Fundal Height:  size equals dates Pelvic Exam:  examination not indicated Cervical Exam: Not evaluated.  Extremities: extremities normal, atraumatic, no cyanosis or edema with DTRs 2+ bilaterally Membranes:intact  Fetal Monitoring:  Baseline: 130s bpm, Variability: Moderate{> 6 bpm), Accelerations: Reactive, and Decelerations: Absent   reactive  Labs:  Results for orders placed or performed during the hospital encounter of 02/14/21 (from the past 24 hour(s))  Glucose, capillary   Collection Time: 02/15/21 11:06 AM  Result Value Ref Range   Glucose-Capillary 96 70 - 99 mg/dL  Glucose, capillary   Collection Time: 02/15/21  5:10 PM  Result Value Ref  Range   Glucose-Capillary 103 (H) 70 - 99 mg/dL   Comment 1 Notify RN    Comment 2 Document in Chart   Glucose, capillary   Collection Time: 02/16/21 12:44 AM  Result Value Ref Range   Glucose-Capillary 123 (H) 70 - 99 mg/dL  Glucose, capillary   Collection Time: 02/16/21  4:27 AM  Result Value Ref Range   Glucose-Capillary 123 (H) 70 - 99 mg/dL  Glucose, capillary   Collection Time: 02/16/21  8:52 AM  Result Value Ref Range   Glucose-Capillary 99 70 - 99 mg/dL    Imaging Studies:    14/12/22 MFM OB LIMITED  Result Date: 02/14/2021 ----------------------------------------------------------------------  OBSTETRICS REPORT                        (Signed Final 02/14/2021 08:44 pm) ---------------------------------------------------------------------- Patient Info  ID #:       14/12/2020                          D.O.B.:  [birth year] (35 yrs)  Name:       [birth year]                    Visit Date: 02/14/2021 09:27 am ---------------------------------------------------------------------- Performed By  Attending:        14/12/2020      Secondary Phy.:    JESSICA EMLY  MD                                                              CNM  Performed By:     Birdena Crandall        Address:           Faculty Practice                    RDMS,RVT  Referred By:      Isa Rankin         [facility]  Ref. Address:     [address]                    [address] ---------------------------------------------------------------------- Orders  #  Description                           Code        Ordered By  1  [place] MFM OB LIMITED                     [id number]    JESSICA EMLY ----------------------------------------------------------------------  #  Order #                     Accession #                Episode #  1  [id number]                   [id number]                 [id number]  ---------------------------------------------------------------------- Indications  Vaginal bleeding in pregnancy, third trimester  O46.93  Marginal insertion of umbilical cord affecting  O43.193  management of mother in third trimester  Gestational diabetes in pregnancy,              O24.419  unspecified control  Advanced maternal age multigravida 42+,         O82.523  third trimester  History of cesarean delivery, currently         O34.219  pregnant X2  Declined Horizon/Panorama  Encounter for other antenatal screening         Z36.2  follow-up  [redacted] weeks gestation of pregnancy                 Z3A.32 ---------------------------------------------------------------------- Fetal Evaluation  Num Of Fetuses:          1  Fetal Heart Rate(bpm):   164  Cardiac Activity:        Observed  Presentation:            Cephalic  Placenta:                Anterior Fundal  P. Cord Insertion:       Marginal insertion, prev seen  Amniotic  Fluid  AFI FV:      Within normal limits  AFI Sum(cm)     %Tile       Largest Pocket(cm)  12.9            38          4.2  RUQ(cm)       RLQ(cm)       LUQ(cm)        LLQ(cm)  3.7           3             2               4.2  Comment:    No placental abruption or previa identified. ---------------------------------------------------------------------- OB History  Gravidity:    4         Term:   2         SAB:   1  Living:       2 ---------------------------------------------------------------------- Gestational Age  LMP:           32w 0d        Date:  07/05/20                 EDD:   04/11/21  Best:          Kelsey Ramirez 0d     Det. By:  LMP  (07/05/20)          EDD:   04/11/21 ---------------------------------------------------------------------- Anatomy  Cranium:               Appears normal         Cord Vessels:           Appears normal (3                                                                        vessel cord)  Ventricles:            Appears normal         Kidneys:                Appear normal   Diaphragm:             Appears normal         Bladder:                Appears normal  Stomach:               Appears normal, left                         sided ---------------------------------------------------------------------- Cervix Uterus Adnexa  Cervix  Length:              6  cm.  Normal appearance by transabdominal scan.  Uterus  No abnormality visualized. ---------------------------------------------------------------------- Impression  Limited exam to due to maternal vaginal bleeding  Good fetal movement and amniotic fluid.  Known marginal cord insertion.  No evidence placental abruption or previa ---------------------------------------------------------------------- Recommendations  Clinical correlation recommended. ----------------------------------------------------------------------               Lin Landsman, MD Electronically Signed Final Report   02/14/2021 08:44 pm ----------------------------------------------------------------------    Medications:  Scheduled  docusate sodium  100 mg Oral Daily   hydrocortisone-pramoxine  1 applicator Rectal BID   prenatal multivitamin  1 tablet Oral Q1200   I have reviewed the patient's current medications.  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 Talked about a minimum of 5 days, up to 7 days >S/P BMZ regimen.  Category I FHR tracing. >A1DM good CBG even with BMZ >Previous C section x 2, for repeat if needed > Continue routine antenatal care.  Jaynie Collins,  MD 02/16/2021,9:53 AM

## 2021-02-17 ENCOUNTER — Encounter: Payer: Self-pay | Admitting: *Deleted

## 2021-02-17 ENCOUNTER — Inpatient Hospital Stay (HOSPITAL_BASED_OUTPATIENT_CLINIC_OR_DEPARTMENT_OTHER): Payer: 59

## 2021-02-17 ENCOUNTER — Other Ambulatory Visit: Payer: Self-pay

## 2021-02-17 DIAGNOSIS — D509 Iron deficiency anemia, unspecified: Secondary | ICD-10-CM | POA: Diagnosis present

## 2021-02-17 DIAGNOSIS — O24410 Gestational diabetes mellitus in pregnancy, diet controlled: Secondary | ICD-10-CM | POA: Diagnosis not present

## 2021-02-17 DIAGNOSIS — O99013 Anemia complicating pregnancy, third trimester: Secondary | ICD-10-CM | POA: Diagnosis present

## 2021-02-17 DIAGNOSIS — Z3A32 32 weeks gestation of pregnancy: Secondary | ICD-10-CM

## 2021-02-17 DIAGNOSIS — O34219 Maternal care for unspecified type scar from previous cesarean delivery: Secondary | ICD-10-CM

## 2021-02-17 DIAGNOSIS — O09523 Supervision of elderly multigravida, third trimester: Secondary | ICD-10-CM | POA: Diagnosis not present

## 2021-02-17 DIAGNOSIS — O4693 Antepartum hemorrhage, unspecified, third trimester: Secondary | ICD-10-CM | POA: Diagnosis not present

## 2021-02-17 LAB — GLUCOSE, CAPILLARY
Glucose-Capillary: 91 mg/dL (ref 70–99)
Glucose-Capillary: 98 mg/dL (ref 70–99)
Glucose-Capillary: 81 mg/dL (ref 70–99)

## 2021-02-17 MED ORDER — SODIUM CHLORIDE 0.9 % IV SOLN
500.0000 mg | Freq: Once | INTRAVENOUS | Status: AC
  Administered 2021-02-17: 500 mg via INTRAVENOUS
  Filled 2021-02-17: qty 25

## 2021-02-17 NOTE — Progress Notes (Signed)
Patient ID: Kelsey Ramirez, female   DOB: September 26, 1985, 35 y.o.   MRN: [id number] FACULTY PRACTICE ANTEPARTUM(COMPREHENSIVE) NOTE  Kelsey Ramirez is a 35 y.o. [id number] with Estimated Date of Delivery: 04/11/21   By  early ultrasound [redacted]w[redacted]d  who is admitted for 3rd trimester bleeding presumed marginal sinus abruption of the leading edge.    Fetal presentation is cephalic. Length of Stay:  3  Days  Date of admission:02/14/2021  Subjective: No bleeding some brown smudging Patient reports the fetal movement as active. Patient reports uterine contraction  activity as none. Patient reports  vaginal bleeding as scant staining. Patient describes fluid per vagina as None.  Vitals:  Blood pressure (!) 97/57, pulse 84, temperature 98.1 F (36.7 C), temperature source Oral, resp. rate 18, height 5' (1.524 m), weight 57.6 kg, last menstrual period 07/05/2020, SpO2 100 %. Vitals:   02/16/21 1626 02/16/21 1957 02/16/21 2353 02/17/21 0559  BP: (!) 95/51 92/60 (!) 97/54 (!) 97/57  Pulse: 88 85 90 84  Resp: 18 18 17 18   Temp: 98 F (36.7 C)  98.6 F (37 C) 98.1 F (36.7 C)  TempSrc: Oral  Oral Oral  SpO2: 100% 100% 100% 100%  Weight:      Height:       Physical Examination:  General appearance - alert, well appearing, and in no distress Abdomen - soft, nontender, nondistended, no masses or organomegaly Fundal Height:  size equals dates Pelvic Exam:  examination not indicated Cervical Exam: Not evaluated.  Extremities: extremities normal, atraumatic, no cyanosis or edema with DTRs 2+ bilaterally Membranes:intact  Fetal Monitoring:  Baseline: 130s bpm, Variability: Good {> 6 bpm), Accelerations: Reactive, and Decelerations: Absent   reactive  Labs:  Results for orders placed or performed during the hospital encounter of 02/14/21 (from the past 24 hour(s))  Glucose, capillary   Collection Time: 02/16/21  8:52 AM  Result Value Ref Range   Glucose-Capillary 99 70 - 99 mg/dL  Glucose, capillary    Collection Time: 02/16/21 11:17 AM  Result Value Ref Range   Glucose-Capillary 94 70 - 99 mg/dL  CBC   Collection Time: 02/16/21 12:36 PM  Result Value Ref Range   WBC 8.5 4.0 - 10.5 K/uL   RBC 3.52 (L) 3.87 - 5.11 MIL/uL   Hemoglobin 9.5 (L) 12.0 - 15.0 g/dL   HCT 14/12/22 (L) 13.0 - 86.5 %   MCV 85.2 80.0 - 100.0 fL   MCH 27.0 26.0 - 34.0 pg   MCHC 31.7 30.0 - 36.0 g/dL   RDW 78.4 69.6 - 29.5 %   Platelets 299 150 - 400 K/uL   nRBC 0.2 0.0 - 0.2 %  Comprehensive metabolic panel   Collection Time: 02/16/21 12:36 PM  Result Value Ref Range   Sodium 135 135 - 145 mmol/L   Potassium 3.3 (L) 3.5 - 5.1 mmol/L   Chloride 108 98 - 111 mmol/L   CO2 19 (L) 22 - 32 mmol/L   Glucose, Bld 134 (H) 70 - 99 mg/dL   BUN 5 (L) 6 - 20 mg/dL   Creatinine, Ser 14/12/22 (L) 0.44 - 1.00 mg/dL   Calcium 8.1 (L) 8.9 - 10.3 mg/dL   Total Protein 5.2 (L) 6.5 - 8.1 g/dL   Albumin 2.3 (L) 3.5 - 5.0 g/dL   AST 17 15 - 41 U/L   ALT 13 0 - 44 U/L   Alkaline Phosphatase 87 38 - 126 U/L   Total Bilirubin 0.4 0.3 - 1.2 mg/dL   GFR, Estimated >  60 >60 mL/min   Anion gap 8 5 - 15  Type and screen   Collection Time: 02/16/21 12:37 PM  Result Value Ref Range   ABO/RH(D) O POS    Antibody Screen NEG    Sample Expiration      02/19/2021,2359 Performed at [facility], [address] [place], [place] [address]   Glucose, capillary   Collection Time: 02/16/21  4:18 PM  Result Value Ref Range   Glucose-Capillary 95 70 - 99 mg/dL  Glucose, capillary   Collection Time: 02/16/21  8:47 PM  Result Value Ref Range   Glucose-Capillary 107 (H) 70 - 99 mg/dL  Glucose, capillary   Collection Time: 02/17/21  5:57 AM  Result Value Ref Range   Glucose-Capillary 91 70 - 99 mg/dL    Imaging Studies:    [place] MFM OB LIMITED  Result Date: 02/14/2021 ----------------------------------------------------------------------  OBSTETRICS REPORT                        (Signed Final 02/14/2021 08:44 pm)  ---------------------------------------------------------------------- Patient Info  ID #:       [id number]                          D.O.B.:  October 13, 1985 (35 yrs)  Name:       Kelsey Ramirez                    Visit Date: 02/14/2021 09:27 am ---------------------------------------------------------------------- Performed By  Attending:        Lin Landsman      Secondary Phy.:    Gerrit Heck                    MD                                                              CNM  Performed By:     Birdena Crandall        Address:           Faculty Practice                    RDMS,RVT  Referred By:      Isa Rankin         [facility]  Ref. Address:     [address]                    [address] ---------------------------------------------------------------------- Orders  #  Description                           Code        Ordered By  1  [place] MFM OB LIMITED  [id number]    Kelsey Ramirez ----------------------------------------------------------------------  #  Order #                     Accession #                Episode #  1  [id number]                   [id number]                 [id number] ---------------------------------------------------------------------- Indications  Vaginal bleeding in pregnancy, third trimester  O46.93  Marginal insertion of umbilical cord affecting  O43.193  management of mother in third trimester  Gestational diabetes in pregnancy,              O24.419  unspecified control  Advanced maternal age multigravida 69+,         O33.523  third trimester  History of cesarean delivery, currently         O34.219  pregnant X2  Declined Horizon/Panorama  Encounter for other antenatal screening         Z36.2  follow-up  [redacted] weeks gestation of pregnancy                 Z3A.32 ---------------------------------------------------------------------- Fetal Evaluation  Num Of Fetuses:          1  Fetal  Heart Rate(bpm):   164  Cardiac Activity:        Observed  Presentation:            Cephalic  Placenta:                Anterior Fundal  P. Cord Insertion:       Marginal insertion, prev seen  Amniotic Fluid  AFI FV:      Within normal limits  AFI Sum(cm)     %Tile       Largest Pocket(cm)  12.9            38          4.2  RUQ(cm)       RLQ(cm)       LUQ(cm)        LLQ(cm)  3.7           3             2               4.2  Comment:    No placental abruption or previa identified. ---------------------------------------------------------------------- OB History  Gravidity:    4         Term:   2         SAB:   1  Living:       2 ---------------------------------------------------------------------- Gestational Age  LMP:           32w 0d        Date:  07/05/20                 EDD:   04/11/21  Best:          Kelsey Ramirez 0d     Det. By:  LMP  (07/05/20)          EDD:   04/11/21 ---------------------------------------------------------------------- Anatomy  Cranium:               Appears normal         Cord Vessels:           Appears normal (3  vessel cord)  Ventricles:            Appears normal         Kidneys:                Appear normal  Diaphragm:             Appears normal         Bladder:                Appears normal  Stomach:               Appears normal, left                         sided ---------------------------------------------------------------------- Cervix Uterus Adnexa  Cervix  Length:              6  cm.  Normal appearance by transabdominal scan.  Uterus  No abnormality visualized. ---------------------------------------------------------------------- Impression  Limited exam to due to maternal vaginal bleeding  Good fetal movement and amniotic fluid.  Known marginal cord insertion.  No evidence placental abruption or previa ---------------------------------------------------------------------- Recommendations  Clinical correlation  recommended. ----------------------------------------------------------------------               Lin Landsman, MD Electronically Signed Final Report   02/14/2021 08:44 pm ----------------------------------------------------------------------    Medications:  Scheduled  docusate sodium  100 mg Oral Daily   hydrocortisone-pramoxine  1 applicator Rectal BID   prenatal multivitamin  1 tablet Oral Q1200   I have reviewed the patient's current medications.  ASSESSMENT: [id number] [redacted]w[redacted]d Estimated Date of Delivery: 04/11/21  3rd trimester bleeding, presumed marginal sinus abruption of the leading edge of the placenta Marginal cord insertion Previous C section x 2   PLAN: No change in clinical management plan >Continue in house observation: Day 4 Talked about a minimum of 5 days, up to 7 days >S/P BMZ regimen.  Category I FHR tracing. >A1DM good CBG even with BMZ >Previous C section x 2, for repeat  > Continue routine antenatal care.  Kelsey Ramirez 02/17/2021,7:33 AM

## 2021-02-18 LAB — GLUCOSE, CAPILLARY
Glucose-Capillary: 100 mg/dL — ABNORMAL HIGH (ref 70–99)
Glucose-Capillary: 93 mg/dL (ref 70–99)
Glucose-Capillary: 88 mg/dL (ref 70–99)
Glucose-Capillary: 86 mg/dL (ref 70–99)

## 2021-02-18 NOTE — Progress Notes (Signed)
Patient ID: Kelsey Ramirez, female   DOB: 04-11-1985, 35 y.o.   MRN: [id number] [place]) NOTE  Kelsey Ramirez is a 35 y.o. [id number] at [redacted]w[redacted]d who is admitted for third trimester bleeding.    Fetal presentation is cephalic. Length of Stay:  4  Days  Date of admission:02/14/2021  [facility] Arabic interpreter present for encounter.  Subjective: No new bleeding since admission. No other concerns. Patient reports the fetal movement as active. Patient reports uterine contraction  activity as none. Patient reports  vaginal bleeding as none. Patient describes fluid per vagina as none.  Vitals:  Blood pressure (!) 94/58, pulse 74, temperature 97.9 F (36.6 C), temperature source Oral, resp. rate 16, height 5' (1.524 m), weight 57.6 kg, last menstrual period 07/05/2020, SpO2 100 %. Vitals:   02/17/21 1628 02/17/21 1955 02/18/21 0337 02/18/21 0821  BP: (!) 85/51 (!) 91/57 96/61 (!) 94/58  Pulse: 75 94 87 74  Resp: 16 16 16 16   Temp: 97.8 F (36.6 C) 97.6 F (36.4 C) 97.9 F (36.6 C) 97.9 F (36.6 C)  TempSrc: Oral Oral Oral Oral  SpO2: 100% 100% 100% 100%  Weight:      Height:       Physical Examination: General appearance - alert, well appearing, and in no distress Abdomen - soft, nontender, nondistended, no masses or organomegaly Fundal Height:  size equals dates Pelvic Exam:  examination not indicated Cervical Exam: Not evaluated.  Extremities: extremities normal, atraumatic, no cyanosis or edema with DTRs 2+ bilaterally Membranes:intact  Fetal Monitoring:  Baseline: 130s bpm, Variability: Moderate{> 6 bpm), Accelerations: Reactive, and Decelerations: Absent   reactive  Labs:  Results for orders placed or performed during the hospital encounter of 02/14/21 (from the past 24 hour(s))  Glucose, capillary   Collection Time: 02/17/21 11:09 AM  Result Value Ref Range   Glucose-Capillary 98 70 - 99 mg/dL  Glucose, capillary   Collection Time: 02/17/21  9:22 PM   Result Value Ref Range   Glucose-Capillary 81 70 - 99 mg/dL  Glucose, capillary   Collection Time: 02/18/21  6:06 AM  Result Value Ref Range   Glucose-Capillary 100 (H) 70 - 99 mg/dL   CBC Latest Ref Rng & Units 02/16/2021 02/14/2021 01/22/2021  WBC 4.0 - 10.5 K/uL 8.5 6.9 8.2  Hemoglobin 12.0 - 15.0 g/dL 9.5(L) 10.6(L) 10.9(L)  Hematocrit 36.0 - 46.0 % 30.0(L) 32.4(L) 33.4(L)  Platelets 150 - 400 K/uL 299 284 278    Imaging Studies:    [place] MFM OB FOLLOW UP  Result Date: 02/17/2021 ----------------------------------------------------------------------  OBSTETRICS REPORT                       (Signed Final 02/17/2021 01:44 pm) ---------------------------------------------------------------------- Patient Info  ID #:       [id number]                          D.O.B.:  [birth year] (35 yrs)  Name:       Kelsey Ramirez                    Visit Date: 02/17/2021 11:09 am ---------------------------------------------------------------------- Performed By  Attending:        Tama High MD        Secondary Phy.:   JESSICA [facility]  CNM  Performed By:     Jeanene Erb BS,      Address:          Faculty [facility]                    [place]  Referred By:      Juanita Craver         [facility]                    [place]  Ref. Address:     [place] ---------------------------------------------------------------------- Orders  #  Description                           Code        Ordered By  1  [place] MFM OB FOLLOW UP                   [id number]    Verita Schneiders ----------------------------------------------------------------------  #  Order #                     Accession #                Episode #  1  [id number]                   [id number]                 [id number] ---------------------------------------------------------------------- Indications  Gestational diabetes in  pregnancy, diet        O24.410  controlled  Vaginal bleeding in pregnancy, third trimester O46.93  Marginal insertion of umbilical cord affecting O43.193  management of mother in third trimester  Advanced maternal age multigravida 42+,        O23.523  third trimester(35 yrs)  History of cesarean delivery, currently        O36.219  pregnant X2  Declined Horizon/Panorama  [redacted] weeks gestation of pregnancy                Z3A.32 ---------------------------------------------------------------------- Fetal Evaluation  Num Of Fetuses:         1  Fetal Heart Rate(bpm):  143  Cardiac Activity:       Observed  Presentation:           Cephalic  Placenta:               Anterior Fundal  P. Cord Insertion:      Marginal insertion  Amniotic Fluid  AFI FV:      Within normal limits  AFI Sum(cm)     %Tile       Largest Pocket(cm)  11.3            27          5.1  RUQ(cm)       RLQ(cm)       LUQ(cm)        LLQ(cm)  5.1           2.8           1.9  1.5  Comment:    No placental abruption or previa identified. ---------------------------------------------------------------------- Biometry  BPD:      84.4  mm     G. Age:  34w 0d         84  %    CI:        77.95   %    70 - 86                                                          FL/HC:      20.5   %    19.1 - 21.3  HC:      302.5  mm     G. Age:  33w 4d         43  %    HC/AC:      1.02        0.96 - 1.17  AC:      297.8  mm     G. Age:  33w 5d         85  %    FL/BPD:     73.3   %    71 - 87  FL:       61.9  mm     G. Age:  32w 1d         28  %    FL/AC:      20.8   %    20 - 24  Est. FW:    2161  gm    4 lb 12 oz      68  % ---------------------------------------------------------------------- OB History  Gravidity:    4         Term:   2         SAB:   1  Living:       2 ---------------------------------------------------------------------- Gestational Age  LMP:           32w 3d        Date:  07/05/20                 EDD:   04/11/21  U/S Today:     33w 3d                                         EDD:   04/04/21  Best:          32w 3d     Det. By:  LMP  (07/05/20)          EDD:   04/11/21 ---------------------------------------------------------------------- Anatomy  Cranium:               Appears normal         LVOT:                   Previously seen  Cavum:                 Appears normal         Aortic Arch:            Previously seen  Ventricles:            Appears normal  Ductal Arch:            Previously seen  Choroid Plexus:        Previously seen        Diaphragm:              Appears normal  Cerebellum:            Previously seen        Stomach:                Appears normal, left                                                                        sided  Posterior Fossa:       Previously seen        Abdomen:                Appears normal  Nuchal Fold:           Previously seen        Abdominal Wall:         Previously seen  Face:                  Orbits and profile     Cord Vessels:           Previously seen                         previously seen  Lips:                  Previously seen        Kidneys:                Appear normal  Palate:                Previously seen        Bladder:                Appears normal  Thoracic:              Appears normal         Spine:                  Previously seen  Heart:                 Appears normal         Upper Extremities:      Previously seen                         (4CH, axis, and                         situs)  RVOT:                  Previously seen        Lower Extremities:      Previously seen  Other:  VC, 3VV, 3VTV,  Lenses, and nasal bone previously visualized.          Parents do not wish to know sex of fetus. ---------------------------------------------------------------------- Cervix Uterus Adnexa  Cervix  Not visualized (  advanced GA >24wks) ---------------------------------------------------------------------- Impression  Patient was admitted with complaints of vaginal bleeding.  Fetal growth is appropriate for  gestational age .Amniotic fluid  is normal and good fetal activity is seen. Placenta looks  normal with no evidence of abruption. Ultrasound has  limitations in diagnosing placental abruption . ---------------------------------------------------------------------- Recommendations  -Fetal growth assessment in 4 weeks.  -Weekly BPP if vaginal bleeding persists. ----------------------------------------------------------------------                  Tama High, MD Electronically Signed Final Report   02/17/2021 01:44 pm ----------------------------------------------------------------------  [place] MFM OB LIMITED  Result Date: 02/14/2021 ----------------------------------------------------------------------  OBSTETRICS REPORT                        (Signed Final 02/14/2021 08:44 pm) ---------------------------------------------------------------------- Patient Info  ID #:       [id number]                          D.O.B.:  May 13, 1985 (35 yrs)  Name:       Kelsey Ramirez                    Visit Date: 02/14/2021 09:27 am ---------------------------------------------------------------------- Performed By  Attending:        Sander Nephew      Secondary Phy.:    Gavin Pound                    MD                                                              CNM  Performed By:     Wilnette Kales        Address:           Faculty Practice                    [place]  Referred By:      Juanita Craver         [facility]                    [place]  Ref. Address:     [place] ---------------------------------------------------------------------- Orders  #  Description                           Code        Ordered By  1  [place] MFM OB LIMITED                     [id number]    JESSICA EMLY ----------------------------------------------------------------------  #  Order #                     Accession #  Episode #  1   [id number]                   [id number]                 [id number] ---------------------------------------------------------------------- Indications  Vaginal bleeding in pregnancy, third trimester  O46.93  Marginal insertion of umbilical cord affecting  O43.193  management of mother in third trimester  Gestational diabetes in pregnancy,              O24.419  unspecified control  Advanced maternal age multigravida 20+,         O11.523  third trimester  History of cesarean delivery, currently         O34.219  pregnant X2  Declined Horizon/Panorama  Encounter for other antenatal screening         Z36.2  follow-up  [redacted] weeks gestation of pregnancy                 Z3A.32 ---------------------------------------------------------------------- Fetal Evaluation  Num Of Fetuses:          1  Fetal Heart Rate(bpm):   164  Cardiac Activity:        Observed  Presentation:            Cephalic  Placenta:                Anterior Fundal  P. Cord Insertion:       Marginal insertion, prev seen  Amniotic Fluid  AFI FV:      Within normal limits  AFI Sum(cm)     %Tile       Largest Pocket(cm)  12.9            38          4.2  RUQ(cm)       RLQ(cm)       LUQ(cm)        LLQ(cm)  3.7           3             2               4.2  Comment:    No placental abruption or previa identified. ---------------------------------------------------------------------- OB History  Gravidity:    4         Term:   2         SAB:   1  Living:       2 ---------------------------------------------------------------------- Gestational Age  LMP:           32w 0d        Date:  07/05/20                 EDD:   04/11/21  Best:          Milderd Meager 0d     Det. By:  LMP  (07/05/20)          EDD:   04/11/21 ---------------------------------------------------------------------- Anatomy  Cranium:               Appears normal         Cord Vessels:           Appears normal (3  vessel cord)  Ventricles:             Appears normal         Kidneys:                Appear normal  Diaphragm:             Appears normal         Bladder:                Appears normal  Stomach:               Appears normal, left                         sided ---------------------------------------------------------------------- Cervix Uterus Adnexa  Cervix  Length:              6  cm.  Normal appearance by transabdominal scan.  Uterus  No abnormality visualized. ---------------------------------------------------------------------- Impression  Limited exam to due to maternal vaginal bleeding  Good fetal movement and amniotic fluid.  Known marginal cord insertion.  No evidence placental abruption or previa ---------------------------------------------------------------------- Recommendations  Clinical correlation recommended. ----------------------------------------------------------------------               Lin Landsman, MD Electronically Signed Final Report   02/14/2021 08:44 pm ----------------------------------------------------------------------    Medications:  Scheduled  docusate sodium  100 mg Oral Daily   hydrocortisone-pramoxine  1 applicator Rectal BID   prenatal multivitamin  1 tablet Oral Q1200   I have reviewed the patient's current medications.  ASSESSMENT: [id number] Estimated Date of Delivery: 04/11/21  Principal Problem:   Vaginal bleeding during pregnancy Active Problems:   Previous cesarean delivery, antepartum   Language barrier affecting health care   Female circumcision   Supervision of high risk pregnancy, antepartum   AMA (advanced maternal age) multigravida 35+, first trimester   Marginal insertion of umbilical cord affecting management of mother   Gestational diabetes mellitus (GDM)   Cervical polyp   [redacted] weeks gestation of pregnancy   Maternal iron deficiency anemia complicating pregnancy in third trimester  PLAN: >No further bleeding, continue in house observation for now. Can consider discharge  tomorrow if remains stable >S/P BMZ regimen.  Category I FHR tracing. Appropriate growth. >A1GDM - good CBG even s/p BMZ >Anemia  - s/p Venofer dose 12/13 >Previous C section x 2, for repeat if needed >Continue routine antenatal care.   Jaynie Collins, MD 02/18/2021,9:09 AM

## 2021-02-19 DIAGNOSIS — O24410 Gestational diabetes mellitus in pregnancy, diet controlled: Secondary | ICD-10-CM

## 2021-02-19 LAB — COMPREHENSIVE METABOLIC PANEL
ALT: 43 U/L (ref 0–44)
AST: 57 U/L — ABNORMAL HIGH (ref 15–41)
Albumin: 2.2 g/dL — ABNORMAL LOW (ref 3.5–5.0)
Alkaline Phosphatase: 89 U/L (ref 38–126)
Anion gap: 6 (ref 5–15)
BUN: 7 mg/dL (ref 6–20)
CO2: 20 mmol/L — ABNORMAL LOW (ref 22–32)
Calcium: 8.1 mg/dL — ABNORMAL LOW (ref 8.9–10.3)
Chloride: 111 mmol/L (ref 98–111)
Creatinine, Ser: 0.37 mg/dL — ABNORMAL LOW (ref 0.44–1.00)
GFR, Estimated: >60 mL/min (ref >60)
Glucose, Bld: 99 mg/dL (ref 70–99)
Potassium: 3.3 mmol/L — ABNORMAL LOW (ref 3.5–5.1)
Sodium: 137 mmol/L (ref 135–145)
Total Bilirubin: 0.3 mg/dL (ref 0.3–1.2)
Total Protein: 5 g/dL — ABNORMAL LOW (ref 6.5–8.1)

## 2021-02-19 LAB — CBC
HCT: 28.5 % — ABNORMAL LOW (ref 36.0–46.0)
Hemoglobin: 9.5 g/dL — ABNORMAL LOW (ref 12.0–15.0)
MCH: 28.3 pg (ref 26.0–34.0)
MCHC: 33.3 g/dL (ref 30.0–36.0)
MCV: 84.8 fL (ref 80.0–100.0)
Platelets: 303 10*3/uL (ref 150–400)
RBC: 3.36 MIL/uL — ABNORMAL LOW (ref 3.87–5.11)
RDW: 15.4 % (ref 11.5–15.5)
WBC: 8.8 10*3/uL (ref 4.0–10.5)
nRBC: 1.5 % — ABNORMAL HIGH (ref 0.0–0.2)

## 2021-02-19 LAB — GLUCOSE, CAPILLARY
Glucose-Capillary: 81 mg/dL (ref 70–99)
Glucose-Capillary: 99 mg/dL (ref 70–99)
Glucose-Capillary: 104 mg/dL — ABNORMAL HIGH (ref 70–99)

## 2021-02-19 LAB — TYPE AND SCREEN
ABO/RH(D): O POS
Antibody Screen: NEGATIVE

## 2021-02-19 NOTE — Discharge Summary (Addendum)
Antenatal Physician Discharge Summary  Patient ID: Arriana Lohmann MRN: [id number] DOB/AGE: 1985/09/15 35 y.o.  Admit date: 02/14/2021 Discharge date: 02/19/2021  Admission Diagnoses: Vaginal bleeding in third trimester, marginal cord insertion, gestational diabetes, two previous cesarean sections, Influenza A, [redacted] weeks gestation  Discharge Diagnoses: The same, cervical polyp  Prenatal Procedures: NST and ultrasound  Consults: None  Hospital Course:  Suzann Lazaro is a 35 y.o. [id number] with IUP at [redacted]w[redacted]d admitted for vaginal bleeding.  On exam, which was very difficult to do, she was noted to have a cervical polyp, but closed cervix.  No leaking of fluid and no active bleeding.   She was admitted for observation. She received betamethasone x 2 doses.  She was observed, fetal heart rate monitoring remained reassuring, and she had no further bleeding, signs/symptoms of progressing preterm labor or other maternal-fetal concerns.  She did receive Venofer for mild anemia. She tested positive for Influenza A, but had no symptoms, no therapy needed during her hospitalization.  Her blood sugars were well controlled on diet despite getting betamethasone.  She was deemed stable for discharge to home with close outpatient follow up on Day 6 of her hospitalization.  Discharge Exam: Temp:  [97.6 F (36.4 C)-98.3 F (36.8 C)] 98.2 F (36.8 C) (12/15 0801) Pulse Rate:  [84-92] 84 (12/15 0801) Resp:  [16-18] 18 (12/15 0801) BP: (95-109)/(61-67) 109/61 (12/15 0801) SpO2:  [100 %] 100 % (12/15 0801) Physical Examination: CONSTITUTIONAL: Well-developed, well-nourished female in no acute distress.  HENT:  Normocephalic, atraumatic, External right and left ear normal.  EYES: Conjunctivae and EOM are normal. Pupils are equal, round, and reactive to light. No scleral icterus.  NECK: Normal range of motion, supple, no masses SKIN: Skin is warm and dry. No rash noted. Not diaphoretic. No erythema. No  pallor. NEUROLOGIC: Alert and oriented to person, place, and time. Normal reflexes, muscle tone coordination. No cranial nerve deficit noted. PSYCHIATRIC: Normal mood and affect. Normal behavior. Normal judgment and thought content. CARDIOVASCULAR: Normal heart rate noted, regular rhythm RESPIRATORY: Effort and breath sounds normal, no problems with respiration noted MUSCULOSKELETAL: Normal range of motion. No edema and no tenderness. 2+ distal pulses. ABDOMEN: Soft, nontender, nondistended, gravid. CERVIX:  Deferred  Fetal monitoring: FHR: 145 bpm, Variability: moderate, Accelerations: Present, Decelerations: Absent  Uterine activity: No contractions  Significant Diagnostic Studies:  Results for orders placed or performed during the hospital encounter of 02/14/21 (from the past 168 hour(s))  GC/Chlamydia probe amp ([facility])not at [facility]   Collection Time: 02/14/21  8:04 AM  Result Value Ref Range   Chlamydia Negative    Neisseria Gonorrhea Negative    Comment Normal Reference Ranger Chlamydia - Negative    Comment      Normal Reference Range Neisseria Gonorrhea - Negative  Wet prep, genital   Collection Time: 02/14/21  8:36 AM   Specimen: Vaginal  Result Value Ref Range   Yeast Wet Prep HPF POC NONE SEEN NONE SEEN   Trich, Wet Prep NONE SEEN NONE SEEN   Clue Cells Wet Prep HPF POC NONE SEEN NONE SEEN   WBC, Wet Prep HPF POC >=10 (A) <10   Sperm NONE SEEN   CBC   Collection Time: 02/14/21  8:53 AM  Result Value Ref Range   WBC 6.9 4.0 - 10.5 K/uL   RBC 3.84 (L) 3.87 - 5.11 MIL/uL   Hemoglobin 10.6 (L) 12.0 - 15.0 g/dL   HCT 14.7 (L) 82.9 - 56.2 %   MCV 84.4 80.0 -  100.0 fL   MCH 27.6 26.0 - 34.0 pg   MCHC 32.7 30.0 - 36.0 g/dL   RDW 81.0 17.5 - 10.2 %   Platelets 284 150 - 400 K/uL   nRBC 0.0 0.0 - 0.2 %  Comprehensive metabolic panel   Collection Time: 02/14/21  8:53 AM  Result Value Ref Range   Sodium 134 (L) 135 - 145 mmol/L   Potassium 3.4 (L) 3.5 - 5.1 mmol/L    Chloride 108 98 - 111 mmol/L   CO2 16 (L) 22 - 32 mmol/L   Glucose, Bld 79 70 - 99 mg/dL   BUN <5 (L) 6 - 20 mg/dL   Creatinine, Ser 5.85 0.44 - 1.00 mg/dL   Calcium 8.1 (L) 8.9 - 10.3 mg/dL   Total Protein 5.7 (L) 6.5 - 8.1 g/dL   Albumin 2.5 (L) 3.5 - 5.0 g/dL   AST 23 15 - 41 U/L   ALT 16 0 - 44 U/L   Alkaline Phosphatase 82 38 - 126 U/L   Total Bilirubin 0.5 0.3 - 1.2 mg/dL   GFR, Estimated >27 >78 mL/min   Anion gap 10 5 - 15  Resp Panel by RT-PCR (Flu A&B, Covid) Nasopharyngeal Swab   Collection Time: 02/14/21  2:58 PM   Specimen: Nasopharyngeal Swab; Nasopharyngeal(NP) swabs in vial transport medium  Result Value Ref Range   SARS Coronavirus 2 by RT PCR NEGATIVE NEGATIVE   Influenza A by PCR POSITIVE (A) NEGATIVE   Influenza B by PCR NEGATIVE NEGATIVE  Glucose, capillary   Collection Time: 02/14/21  6:38 PM  Result Value Ref Range   Glucose-Capillary 98 70 - 99 mg/dL  Glucose, capillary   Collection Time: 02/15/21  1:48 AM  Result Value Ref Range   Glucose-Capillary 96 70 - 99 mg/dL  Glucose, capillary   Collection Time: 02/15/21  5:11 AM  Result Value Ref Range   Glucose-Capillary 82 70 - 99 mg/dL  Glucose, capillary   Collection Time: 02/15/21 11:06 AM  Result Value Ref Range   Glucose-Capillary 96 70 - 99 mg/dL  Glucose, capillary   Collection Time: 02/15/21  5:10 PM  Result Value Ref Range   Glucose-Capillary 103 (H) 70 - 99 mg/dL   Comment 1 Notify RN    Comment 2 Document in Chart   Glucose, capillary   Collection Time: 02/16/21 12:44 AM  Result Value Ref Range   Glucose-Capillary 123 (H) 70 - 99 mg/dL  Glucose, capillary   Collection Time: 02/16/21  4:27 AM  Result Value Ref Range   Glucose-Capillary 123 (H) 70 - 99 mg/dL  Glucose, capillary   Collection Time: 02/16/21  8:52 AM  Result Value Ref Range   Glucose-Capillary 99 70 - 99 mg/dL  Glucose, capillary   Collection Time: 02/16/21 11:17 AM  Result Value Ref Range   Glucose-Capillary 94 70 -  99 mg/dL  CBC   Collection Time: 02/16/21 12:36 PM  Result Value Ref Range   WBC 8.5 4.0 - 10.5 K/uL   RBC 3.52 (L) 3.87 - 5.11 MIL/uL   Hemoglobin 9.5 (L) 12.0 - 15.0 g/dL   HCT 24.2 (L) 35.3 - 61.4 %   MCV 85.2 80.0 - 100.0 fL   MCH 27.0 26.0 - 34.0 pg   MCHC 31.7 30.0 - 36.0 g/dL   RDW 43.1 54.0 - 08.6 %   Platelets 299 150 - 400 K/uL   nRBC 0.2 0.0 - 0.2 %  Comprehensive metabolic panel   Collection Time: 02/16/21 12:36 PM  Result Value Ref Range   Sodium 135 135 - 145 mmol/L   Potassium 3.3 (L) 3.5 - 5.1 mmol/L   Chloride 108 98 - 111 mmol/L   CO2 19 (L) 22 - 32 mmol/L   Glucose, Bld 134 (H) 70 - 99 mg/dL   BUN 5 (L) 6 - 20 mg/dL   Creatinine, Ser 1.61 (L) 0.44 - 1.00 mg/dL   Calcium 8.1 (L) 8.9 - 10.3 mg/dL   Total Protein 5.2 (L) 6.5 - 8.1 g/dL   Albumin 2.3 (L) 3.5 - 5.0 g/dL   AST 17 15 - 41 U/L   ALT 13 0 - 44 U/L   Alkaline Phosphatase 87 38 - 126 U/L   Total Bilirubin 0.4 0.3 - 1.2 mg/dL   GFR, Estimated >09 >60 mL/min   Anion gap 8 5 - 15  Type and screen   Collection Time: 02/16/21 12:37 PM  Result Value Ref Range   ABO/RH(D) O POS    Antibody Screen NEG    Sample Expiration      02/19/2021,2359 Performed at [facility], [address] [place], [place] [address]   Glucose, capillary   Collection Time: 02/16/21  4:18 PM  Result Value Ref Range   Glucose-Capillary 95 70 - 99 mg/dL  Glucose, capillary   Collection Time: 02/16/21  8:47 PM  Result Value Ref Range   Glucose-Capillary 107 (H) 70 - 99 mg/dL  Glucose, capillary   Collection Time: 02/17/21  5:57 AM  Result Value Ref Range   Glucose-Capillary 91 70 - 99 mg/dL  Glucose, capillary   Collection Time: 02/17/21 11:09 AM  Result Value Ref Range   Glucose-Capillary 98 70 - 99 mg/dL  Glucose, capillary   Collection Time: 02/17/21  9:22 PM  Result Value Ref Range   Glucose-Capillary 81 70 - 99 mg/dL  Glucose, capillary   Collection Time: 02/18/21  6:06 AM  Result Value Ref Range    Glucose-Capillary 100 (H) 70 - 99 mg/dL  Glucose, capillary   Collection Time: 02/18/21 11:04 AM  Result Value Ref Range   Glucose-Capillary 93 70 - 99 mg/dL  Glucose, capillary   Collection Time: 02/18/21  6:02 PM  Result Value Ref Range   Glucose-Capillary 88 70 - 99 mg/dL  Glucose, capillary   Collection Time: 02/18/21  9:04 PM  Result Value Ref Range   Glucose-Capillary 86 70 - 99 mg/dL  Glucose, capillary   Collection Time: 02/19/21  5:13 AM  Result Value Ref Range   Glucose-Capillary 81 70 - 99 mg/dL  Glucose, capillary   Collection Time: 02/19/21 10:48 AM  Result Value Ref Range   Glucose-Capillary 99 70 - 99 mg/dL  Results for orders placed or performed during the hospital encounter of 02/13/21 (from the past 168 hour(s))  Culture, OB Urine   Collection Time: 02/13/21  9:21 AM   Specimen: Urine, Random  Result Value Ref Range   Specimen Description URINE, RANDOM    Special Requests NONE    Culture (A)     MULTIPLE SPECIES PRESENT, SUGGEST RECOLLECTION NO GROUP B STREP (S.AGALACTIAE) ISOLATED Performed at [facility], [address], [place], [place] [address]    Report Status 02/14/2021 FINAL   Urinalysis, Routine w reflex microscopic Urine, Clean Catch   Collection Time: 02/13/21  9:21 AM  Result Value Ref Range   Color, Urine YELLOW YELLOW   APPearance HAZY (A) CLEAR   Specific Gravity, Urine 1.015 1.005 - 1.030   pH 7.0  5.0 - 8.0   Glucose, UA NEGATIVE NEGATIVE mg/dL   Hgb urine dipstick LARGE (A) NEGATIVE   Bilirubin Urine NEGATIVE NEGATIVE   Ketones, ur 40 (A) NEGATIVE mg/dL   Protein, ur NEGATIVE NEGATIVE mg/dL   Nitrite NEGATIVE NEGATIVE   Leukocytes,Ua SMALL (A) NEGATIVE  Urinalysis, Microscopic (reflex)   Collection Time: 02/13/21  9:21 AM  Result Value Ref Range   RBC / HPF 0-5 0 - 5 RBC/hpf   WBC, UA 6-10 0 - 5 WBC/hpf   Bacteria, UA RARE (A) NONE SEEN   Squamous Epithelial / LPF 0-5 0 - 5   Non Squamous Epithelial PRESENT (A) NONE SEEN    Mucus PRESENT   Glucose, capillary   Collection Time: 02/13/21 10:03 AM  Result Value Ref Range   Glucose-Capillary 82 70 - 99 mg/dL   [place] MFM OB FOLLOW UP  Result Date: 02/17/2021 ----------------------------------------------------------------------  OBSTETRICS REPORT                       (Signed Final 02/17/2021 01:44 pm) ---------------------------------------------------------------------- Patient Info  ID #:       [id number]                          D.O.B.:  04/21/1985 (35 yrs)  Name:       Lahoma Rocker                    Visit Date: 02/17/2021 11:09 am ---------------------------------------------------------------------- Performed By  Attending:        Noralee Space MD        Secondary Phy.:   JESSICA EMLY                                                             CNM  Performed By:     Emeline Darling BS,      Address:          Faculty Practice                    RDMS  Referred By:      Isa Rankin         [facility]  Ref. Address:     [address] ---------------------------------------------------------------------- Orders  #  Description                           Code        Ordered By  1  [place] MFM OB FOLLOW UP                   32202.54    Jaynie Collins ----------------------------------------------------------------------  #  Order #  Accession #                Episode #  1  [id number]                   [id number]                 [id number] ---------------------------------------------------------------------- Indications  Gestational diabetes in pregnancy, diet        O24.410  controlled  Vaginal bleeding in pregnancy, third trimester O46.93  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 Cord Insertion:      Marginal insertion  Amniotic Fluid  AFI FV:      Within normal limits  AFI Sum(cm)     %Tile       Largest Pocket(cm)  11.3            27          5.1  RUQ(cm)       RLQ(cm)       LUQ(cm)        LLQ(cm)  5.1           2.8           1.9            1.5  Comment:    No placental abruption or previa identified. ---------------------------------------------------------------------- Biometry  BPD:      84.4  mm     G. Age:  34w 0d         84  %    CI:        77.95   %    70 - 86                                                          FL/HC:      20.5   %    19.1 - 21.3  HC:      302.5  mm     G. Age:  33w 4d         43  %    HC/AC:      1.02        0.96 - 1.17  AC:      297.8  mm     G. Age:  33w 5d         85  %    FL/BPD:     73.3   %    71 - 87  FL:       61.9  mm     G.  Age:  32w 1d         28  %    FL/AC:      20.8   %    20 - 24  Est. FW:    2161  gm    4 lb 12 oz      68  % ---------------------------------------------------------------------- OB History  Gravidity:    4         Term:   2         SAB:   1  Living:       2 ---------------------------------------------------------------------- Gestational Age  LMP:           32w 3d        Date:  07/05/20                 EDD:   04/11/21  U/S Today:     33w 3d                                        EDD:   04/04/21  Best:          32w 3d     Det. By:  LMP  (07/05/20)          EDD:   04/11/21 ---------------------------------------------------------------------- Anatomy  Cranium:               Appears normal         LVOT:                   Previously seen  Cavum:                  Appears normal         Aortic Arch:            Previously seen  Ventricles:            Appears normal         Ductal Arch:            Previously seen  Choroid Plexus:        Previously seen        Diaphragm:              Appears normal  Cerebellum:            Previously seen        Stomach:                Appears normal, left                                                                        sided  Posterior Fossa:       Previously seen        Abdomen:                Appears normal  Nuchal Fold:           Previously seen        Abdominal Wall:         Previously seen  Face:  Orbits and profile     Cord Vessels:           Previously seen                         previously seen  Lips:                  Previously seen        Kidneys:                Appear normal  Palate:                Previously seen        Bladder:                Appears normal  Thoracic:              Appears normal         Spine:                  Previously seen  Heart:                 Appears normal         Upper Extremities:      Previously seen                         (4CH, axis, and                         situs)  RVOT:                  Previously seen        Lower Extremities:      Previously seen  Other:  VC, 3VV, 3VTV,  Lenses, and nasal bone previously visualized.          Parents do not wish to know sex of fetus. ---------------------------------------------------------------------- Cervix Uterus Adnexa  Cervix  Not visualized (advanced GA >24wks) ---------------------------------------------------------------------- Impression  Patient was admitted with complaints of vaginal bleeding.  Fetal growth is appropriate for gestational age .Amniotic fluid  is normal and good fetal activity is seen. Placenta looks  normal with no evidence of abruption. Ultrasound has  limitations in diagnosing placental abruption . ---------------------------------------------------------------------- Recommendations  -Fetal growth assessment  in 4 weeks.  -Weekly BPP if vaginal bleeding persists. ----------------------------------------------------------------------                  Noralee Space, MD Electronically Signed Final Report   02/17/2021 01:44 pm ----------------------------------------------------------------------  [place] MFM OB LIMITED  Result Date: 02/14/2021 ----------------------------------------------------------------------  OBSTETRICS REPORT                        (Signed Final 02/14/2021 08:44 pm) ---------------------------------------------------------------------- Patient Info  ID #:       [id number]                          D.O.B.:  [birth year] (35 yrs)  Name:       Lahoma Rocker                    Visit Date: 02/14/2021 09:27 am ---------------------------------------------------------------------- Performed By  Attending:        Lin Landsman      Secondary Phy.:    JESSICA EMLY  MD                                                              CNM  Performed By:     Birdena Crandall        Address:           Faculty Practice                    RDMS,RVT  Referred By:      Isa Rankin         [facility]  Ref. Address:     [address]                    [address] ---------------------------------------------------------------------- Orders  #  Description                           Code        Ordered By  1  [place] MFM OB LIMITED                     [id number]    JESSICA EMLY ----------------------------------------------------------------------  #  Order #                     Accession #                Episode #  1  [id number]                   [id number]                 [id number] ---------------------------------------------------------------------- Indications  Vaginal bleeding in pregnancy, third trimester  O46.93  Marginal insertion of umbilical cord affecting  O43.193  management of mother in third trimester   Gestational diabetes in pregnancy,              O24.419  unspecified control  Advanced maternal age multigravida 78+,         O81.523  third trimester  History of cesarean delivery, currently         O34.219  pregnant X2  Declined Horizon/Panorama  Encounter for other antenatal screening         Z36.2  follow-up  [redacted] weeks gestation of pregnancy                 Z3A.32 ---------------------------------------------------------------------- Fetal Evaluation  Num Of Fetuses:          1  Fetal Heart Rate(bpm):   164  Cardiac Activity:        Observed  Presentation:            Cephalic  Placenta:                Anterior Fundal  P. Cord Insertion:       Marginal insertion, prev seen  Amniotic  Fluid  AFI FV:      Within normal limits  AFI Sum(cm)     %Tile       Largest Pocket(cm)  12.9            38          4.2  RUQ(cm)       RLQ(cm)       LUQ(cm)        LLQ(cm)  3.7           3             2               4.2  Comment:    No placental abruption or previa identified. ---------------------------------------------------------------------- OB History  Gravidity:    4         Term:   2         SAB:   1  Living:       2 ---------------------------------------------------------------------- Gestational Age  LMP:           32w 0d        Date:  07/05/20                 EDD:   04/11/21  Best:          [name] 0d     Det. By:  LMP  (07/05/20)          EDD:   04/11/21 ---------------------------------------------------------------------- Anatomy  Cranium:               Appears normal         Cord Vessels:           Appears normal (3                                                                        vessel cord)  Ventricles:            Appears normal         Kidneys:                Appear normal  Diaphragm:             Appears normal         Bladder:                Appears normal  Stomach:               Appears normal, left                         sided ---------------------------------------------------------------------- Cervix  Uterus Adnexa  Cervix  Length:              6  cm.  Normal appearance by transabdominal scan.  Uterus  No abnormality visualized. ---------------------------------------------------------------------- Impression  Limited exam to due to maternal vaginal bleeding  Good fetal movement and amniotic fluid.  Known marginal cord insertion.  No evidence placental abruption or previa ---------------------------------------------------------------------- Recommendations  Clinical correlation recommended. ----------------------------------------------------------------------               Lin Landsman, MD Electronically Signed Final Report   02/14/2021 08:44 pm ----------------------------------------------------------------------   Future Appointments  Date Time Provider Department Center  02/23/2021  9:35 AM Lino Lakes Bing, MD [facility]  02/24/2021  2:15 PM [facility]    Discharge Condition: Stable  Discharge disposition: 01-Home or Self Care      Discharge Instructions     Discharge activity:   Complete by: As directed    Limited activites   Discharge diet:  No restrictions   Complete by: As directed    Do not have sex or do anything that might make you have an orgasm   Complete by: As directed    Fetal Kick Count:  Lie on our left side for one hour after a meal, and count the number of times your baby kicks.  If it is less than 5 times, get up, move around and drink some juice.  Repeat the test 30 minutes later.  If it is still less than 5 kicks in an hour, notify your doctor.   Complete by: As directed    Notify physician for a general feeling that "something is not right"   Complete by: As directed    Notify physician for uterine contractions.  These may be painless and feel like the uterus is tightening or the baby is  "balling up"   Complete by: As directed    Notify physician for vaginal bleeding   Complete by: As directed    PRETERM LABOR:  Includes any of the  follwing symptoms that occur between 20 - [redacted] weeks gestation.  If these symptoms are not stopped, preterm labor can result in preterm delivery, placing your baby at risk   Complete by: As directed       Allergies as of 02/19/2021   No Known Allergies      Medication List     TAKE these medications    Accu-Chek Guide test strip Generic drug: glucose blood Use as instructed   Accu-Chek Softclix Lancets lancets Use as instructed   Blood Pressure Monitoring Devi 1 each by Does not apply route once a week.   docusate sodium 100 MG capsule Commonly known as: COLACE Take 1 capsule (100 mg total) by mouth 3 (three) times daily.   omeprazole 40 MG capsule Commonly known as: PRILOSEC Take 1 capsule (40 mg total) by mouth daily.   PrePLUS 27-1 MG Tabs Take 1 tablet by mouth daily.       \ Total discharge time:  25 minutes    Signed: Jaynie Collins M.D. 02/19/2021, 11:17 AM

## 2021-02-20 ENCOUNTER — Ambulatory Visit: Payer: 59

## 2021-02-23 ENCOUNTER — Ambulatory Visit (INDEPENDENT_AMBULATORY_CARE_PROVIDER_SITE_OTHER): Payer: 59 | Admitting: Obstetrics and Gynecology

## 2021-02-23 ENCOUNTER — Other Ambulatory Visit: Payer: Self-pay

## 2021-02-23 VITALS — BP 106/72 | HR 87 | Wt 126.1 lb

## 2021-02-23 DIAGNOSIS — O99013 Anemia complicating pregnancy, third trimester: Secondary | ICD-10-CM

## 2021-02-23 DIAGNOSIS — O09521 Supervision of elderly multigravida, first trimester: Secondary | ICD-10-CM

## 2021-02-23 DIAGNOSIS — O0990 Supervision of high risk pregnancy, unspecified, unspecified trimester: Secondary | ICD-10-CM

## 2021-02-23 DIAGNOSIS — O09299 Supervision of pregnancy with other poor reproductive or obstetric history, unspecified trimester: Secondary | ICD-10-CM

## 2021-02-23 DIAGNOSIS — O43199 Other malformation of placenta, unspecified trimester: Secondary | ICD-10-CM

## 2021-02-23 DIAGNOSIS — Z789 Other specified health status: Secondary | ICD-10-CM

## 2021-02-23 DIAGNOSIS — O34219 Maternal care for unspecified type scar from previous cesarean delivery: Secondary | ICD-10-CM

## 2021-02-23 DIAGNOSIS — Z3A33 33 weeks gestation of pregnancy: Secondary | ICD-10-CM

## 2021-02-23 DIAGNOSIS — R5383 Other fatigue: Secondary | ICD-10-CM

## 2021-02-23 DIAGNOSIS — D509 Iron deficiency anemia, unspecified: Secondary | ICD-10-CM

## 2021-02-23 DIAGNOSIS — R7401 Elevation of levels of liver transaminase levels: Secondary | ICD-10-CM | POA: Insufficient documentation

## 2021-02-23 NOTE — Progress Notes (Signed)
° ° ° °  PRENATAL VISIT NOTE  Subjective:  Kelsey Ramirez is a 35 y.o. [id number] at [redacted]w[redacted]d being seen today for ongoing prenatal care.  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  Patient reports  lethargy at times .  Contractions: Not present. Vag. Bleeding: None.  Movement: Present. Denies leaking of fluid.   The following portions of the patient's history were reviewed and updated as appropriate: allergies, current medications, past family history, past medical history, past social history, past surgical history and problem list.   Objective:   Vitals:   02/23/21 0954  BP: 106/72  Pulse: 87  Weight: 126 lb 1.6 oz (57.2 kg)    Fetal Status: Fetal Heart Rate (bpm): 147   Movement: Present     General:  Alert, oriented and cooperative. Patient is in no acute distress.  Skin: Skin is warm and dry. No rash noted.   Cardiovascular: Normal heart rate noted  Respiratory: Normal respiratory effort, no problems with respiration noted  Abdomen: Soft, gravid, appropriate for gestational age.  Pain/Pressure: Present     Pelvic: Cervical exam deferred        Extremities: Normal range of motion.  Edema: None  Mental Status: Normal mood and affect. Normal behavior. Normal judgment and thought content.   Assessment and Plan:  Pregnancy: [id number] at [redacted]w[redacted]d 1. Marginal insertion of umbilical cord affecting management of mother Has  rpt growth u/s tomorrow. Normal 12/13 growth (68%, 2161gm, ac 85%, afi 11)  2. AMA (advanced maternal age) multigravida 35+, first trimester No issues  3. Supervision of high risk pregnancy, antepartum Patient undecided on contraception  4. History of miscarriage, currently pregnant, unspecified trimester  5. Maternal iron deficiency anemia complicating pregnancy in third trimester Hgb 9.5 on 12/15. Pt declines anemia panel, tsh today. Consider offering iv iron nv  6. [redacted] weeks gestation of pregnancy  7. Previous cesarean delivery, antepartum Desires rpt surgeon, preferably with female Careers adviser. 39wk request sent  8. Lethargy See above.   9. Elevated AST Offer rpt check again nv  Interpreter used   Preterm labor symptoms and general obstetric precautions including but not limited to vaginal bleeding, contractions, leaking of fluid and fetal movement were reviewed in detail with the patient. Please refer to After Visit Summary for other counseling recommendations.   Return in about 10 days (around 03/05/2021) for in person, md or app, low risk ob.  Future Appointments  Date Time Provider Department Center  02/24/2021  2:15 PM [facility]  03/05/2021  8:55 AM Reva Bores, MD [facility]    Kaibito Bing, MD

## 2021-02-24 ENCOUNTER — Encounter: Payer: 59 | Admitting: Registered"

## 2021-02-24 ENCOUNTER — Telehealth (INDEPENDENT_AMBULATORY_CARE_PROVIDER_SITE_OTHER): Payer: 59 | Admitting: Registered"

## 2021-02-24 DIAGNOSIS — O24410 Gestational diabetes mellitus in pregnancy, diet controlled: Secondary | ICD-10-CM | POA: Diagnosis present

## 2021-02-24 NOTE — Progress Notes (Signed)
Virtual Visit via Video Note  I connected with Kelsey Ramirez on 02/24/21 at  2:20 PM EST by a video enabled telemedicine application and verified that I am speaking with the correct person using two identifiers.  Placed phone call asking patient to   Location: Patient: at home Provider: [facility]  [employer] (video) [id number] [phone].   I discussed the limitations of evaluation and management by telemedicine and the availability of in person appointments. The patient expressed understanding and agreed to proceed.  History of Present Illness: Patinet was seen for initial gestational diabetes visit on 02/05/21. Patient began checking blood sugar and returns for follow-up and continued education.  Pt states she has eliminated sugary stuff and stopped eating rice and macaroni. Pt reports every time drinks milk blood sugar goes high. Pt uses splenda to sweeten coffee and tea. Pt states she was given a diet coke in hospital and was told it was okay for blood sugar.  Pt states when drinking milk she has ~5 no-sugar cookies (showed package showing 24 g cho) her blood sugar goes a little over 120 mg/dL.  Patient was wanting to know bread she should eat. Pt has been having 2 slices of Terrill Mohr 100% whole wheat bread with beans, eggs, okra.This bread has 11 g cho per slice.   FBS: 82 mg/dL  PPBG: 024 mg/dL   Follow Up Instructions: Continue checking blood sugar and bring log sheet to all appointments. Read labels for serving size and total carbohydrates Aim for less than 60 g cho per meal/30 g per snack Use handout provided in first appt for sample meals monitor glucose levels, goals: FBS: 60 - 95 mg/dl 2 hour: <097 mg/dl   I discussed the assessment and treatment plan with the patient. The patient was provided an opportunity to ask questions and all were answered. The patient agreed with the plan and demonstrated an understanding of the instructions.   The  patient was advised to call back or seek an in-person evaluation if the symptoms worsen or if the condition fails to improve as anticipated.  I provided 35 minutes of non-face-to-face time during this encounter.   Heywood Bene, RD, LDN, CDCES

## 2021-02-25 ENCOUNTER — Encounter: Payer: Self-pay | Admitting: Radiology

## 2021-02-26 ENCOUNTER — Other Ambulatory Visit: Payer: Self-pay

## 2021-03-05 ENCOUNTER — Ambulatory Visit (INDEPENDENT_AMBULATORY_CARE_PROVIDER_SITE_OTHER): Payer: 59 | Admitting: Family Medicine

## 2021-03-05 ENCOUNTER — Telehealth (INDEPENDENT_AMBULATORY_CARE_PROVIDER_SITE_OTHER): Payer: 59 | Admitting: Registered"

## 2021-03-05 ENCOUNTER — Other Ambulatory Visit: Payer: Self-pay

## 2021-03-05 VITALS — BP 99/67 | HR 94 | Wt 127.0 lb

## 2021-03-05 DIAGNOSIS — O09521 Supervision of elderly multigravida, first trimester: Secondary | ICD-10-CM

## 2021-03-05 DIAGNOSIS — O0990 Supervision of high risk pregnancy, unspecified, unspecified trimester: Secondary | ICD-10-CM | POA: Diagnosis not present

## 2021-03-05 DIAGNOSIS — O4690 Antepartum hemorrhage, unspecified, unspecified trimester: Secondary | ICD-10-CM

## 2021-03-05 DIAGNOSIS — O24410 Gestational diabetes mellitus in pregnancy, diet controlled: Secondary | ICD-10-CM | POA: Diagnosis not present

## 2021-03-05 DIAGNOSIS — O34219 Maternal care for unspecified type scar from previous cesarean delivery: Secondary | ICD-10-CM | POA: Diagnosis not present

## 2021-03-05 DIAGNOSIS — O99013 Anemia complicating pregnancy, third trimester: Secondary | ICD-10-CM

## 2021-03-05 DIAGNOSIS — Z603 Acculturation difficulty: Secondary | ICD-10-CM

## 2021-03-05 DIAGNOSIS — Z789 Other specified health status: Secondary | ICD-10-CM

## 2021-03-05 DIAGNOSIS — O43199 Other malformation of placenta, unspecified trimester: Secondary | ICD-10-CM

## 2021-03-05 DIAGNOSIS — D509 Iron deficiency anemia, unspecified: Secondary | ICD-10-CM

## 2021-03-05 NOTE — Patient Instructions (Signed)

## 2021-03-05 NOTE — Progress Notes (Signed)
° °  PRENATAL VISIT NOTE  Subjective:  Kelsey Ramirez is a 35 y.o. V6X4503 at [redacted]w[redacted]d being seen today for ongoing prenatal care.  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  Patient reports no complaints.  Contractions: Irritability. Vag. Bleeding: None.  Movement: Present. Denies leaking of fluid.   The following portions of the patient's history were reviewed and updated as appropriate: allergies, current medications, past family history, past medical history, past social history, past surgical history and problem list.   Objective:   Vitals:   03/05/21 0901  BP: 99/67  Pulse: 94  Weight: 127 lb (57.6 kg)    Fetal Status: Fetal Heart Rate (bpm): 140   Movement: Present     General:  Alert, oriented and cooperative. Patient is in no acute distress.  Skin: Skin is warm and dry. No rash noted.   Cardiovascular: Normal heart rate noted  Respiratory: Normal respiratory effort, no problems with respiration noted  Abdomen: Soft, gravid, appropriate for gestational age.  Pain/Pressure: Present     Pelvic: Cervical exam deferred        Extremities: Normal range of motion.  Edema: None  Mental Status: Normal mood and affect. Normal behavior. Normal judgment and thought content.   Assessment and Plan:  Pregnancy: [id number] at [redacted]w[redacted]d 1. Diet controlled gestational diabetes mellitus (GDM) in third  trimester  Continue diet--will check glucometer  2. Previous cesarean delivery, antepartum Scheduled for RCS  3. Language barrier affecting health care Arabic interpreter: in-person used  4. Supervision of high risk pregnancy, antepartum Cultures next visit  5. AMA (advanced maternal age) multigravida 35+, first trimester Declined genetics  6. Marginal insertion of umbilical cord affecting management of mother F/u for growth - [place] MFM OB FOLLOW UP; Future  7. Maternal iron deficiency anemia complicating pregnancy in third trimester Continue iron repletion  8. Vaginal bleeding during pregnancy resolved  Preterm labor symptoms and general obstetric precautions including but not limited to vaginal bleeding, contractions, leaking of fluid and fetal movement were reviewed in detail with the patient. Please refer to After Visit Summary for other counseling recommendations.   Return in 2 weeks (on 03/19/2021) for [facility].  Future Appointments  Date Time Provider Department Center  03/05/2021  3:15 PM [facility]  04/02/2021  9:00 AM [facility] PAT 1 [facility] None    Reva Bores, MD

## 2021-03-05 NOTE — Progress Notes (Deleted)
Virtual Visit via Video Note  I connected with Kelsey Ramirez on 12/***/22 at  3:0 PM EST by a video enabled telemedicine application and verified that I am speaking with the correct person using two identifiers.  Location: Patient: at home Provider: [facility] [place]  [employer] (video) [place] [phone].   I discussed the limitations of evaluation and management by telemedicine and the availability of in person appointments. The patient expressed understanding and agreed to proceed.  History of Present Illness: Patinet was seen for initial gestational diabetes visit on 02/05/21. Patient is checking blood sugar and returns for follow-up and continued education.    FBS: 82 mg/dL  PPBG: 938 mg/dL  Wt Readings from Last 3 Encounters:  03/05/21 127 lb (57.6 kg)  02/23/21 126 lb 1.6 oz (57.2 kg)  02/14/21 126 lb 14.4 oz (57.6 kg)     Follow Up Instructions: Continue checking blood sugar and bring log sheet to all appointments. Read labels for serving size and total carbohydrates Aim for less than 60 g cho per meal/30 g per snack Use handout provided in first appt for sample meals monitor glucose levels, goals: FBS: 60 - 95 mg/dl 2 hour: <101 mg/dl   I discussed the assessment and treatment plan with the patient. The patient was provided an opportunity to ask questions and all were answered. The patient agreed with the plan and demonstrated an understanding of the instructions.   The patient was advised to call back or seek an in-person evaluation if the symptoms worsen or if the condition fails to improve as anticipated.  I provided 35 minutes of non-face-to-face time during this encounter.   Heywood Bene, RD, LDN, CDCES

## 2021-03-06 LAB — GLUCOSE, CAPILLARY: Glucose-Capillary: 105 mg/dL — ABNORMAL HIGH (ref 70–99)

## 2021-03-18 ENCOUNTER — Encounter: Payer: 59 | Admitting: Student

## 2021-03-19 ENCOUNTER — Other Ambulatory Visit: Payer: Self-pay

## 2021-03-19 ENCOUNTER — Ambulatory Visit (INDEPENDENT_AMBULATORY_CARE_PROVIDER_SITE_OTHER): Payer: Medicaid Other | Admitting: Family Medicine

## 2021-03-19 VITALS — BP 106/77 | HR 77 | Wt 132.0 lb

## 2021-03-19 DIAGNOSIS — Z603 Acculturation difficulty: Secondary | ICD-10-CM

## 2021-03-19 DIAGNOSIS — Z789 Other specified health status: Secondary | ICD-10-CM

## 2021-03-19 DIAGNOSIS — O34219 Maternal care for unspecified type scar from previous cesarean delivery: Secondary | ICD-10-CM

## 2021-03-19 DIAGNOSIS — O0990 Supervision of high risk pregnancy, unspecified, unspecified trimester: Secondary | ICD-10-CM

## 2021-03-19 DIAGNOSIS — O24410 Gestational diabetes mellitus in pregnancy, diet controlled: Secondary | ICD-10-CM

## 2021-03-19 DIAGNOSIS — Z3A36 36 weeks gestation of pregnancy: Secondary | ICD-10-CM

## 2021-03-19 DIAGNOSIS — D509 Iron deficiency anemia, unspecified: Secondary | ICD-10-CM

## 2021-03-19 DIAGNOSIS — O99013 Anemia complicating pregnancy, third trimester: Secondary | ICD-10-CM

## 2021-03-19 LAB — CBC
Hematocrit: 33.1 % — ABNORMAL LOW (ref 34.0–46.6)
Hemoglobin: 11.5 g/dL (ref 11.1–15.9)
MCH: 28.7 pg (ref 26.6–33.0)
MCHC: 34.7 g/dL (ref 31.5–35.7)
MCV: 83 fL (ref 79–97)
Platelets: 291 10*3/uL (ref 150–450)
RBC: 4.01 x10E6/uL (ref 3.77–5.28)
RDW: 15.9 % — ABNORMAL HIGH (ref 11.7–15.4)
WBC: 5.9 10*3/uL (ref 3.4–10.8)

## 2021-03-19 NOTE — Progress Notes (Addendum)
° °  PRENATAL VISIT NOTE  Subjective:  Kelsey Ramirez is a 36 y.o. V6H2094 at [redacted]w[redacted]d being seen today for ongoing prenatal care.  She is currently monitored for the following issues for this low-risk pregnancy and has Previous cesarean delivery, antepartum; Language barrier affecting health care; Female circumcision; History of miscarriage, currently pregnant; Complication of anesthesia; Supervision of high risk pregnancy, antepartum; AMA (advanced maternal age) multigravida 35+, first trimester; Marginal insertion of umbilical cord affecting management of mother; Gestational diabetes mellitus (GDM); Cervical polyp; Vaginal bleeding during pregnancy; Maternal iron deficiency anemia complicating pregnancy in third trimester; and Elevated AST (SGOT) on their problem list.  Patient reports no complaints.  Contractions: Not present. Vag. Bleeding: None.  Movement: Present. Denies leaking of fluid.   The following portions of the patient's history were reviewed and updated as appropriate: allergies, current medications, past family history, past medical history, past social history, past surgical history and problem list.   Objective:   Vitals:   03/19/21 1056  BP: 106/77  Pulse: 77  Weight: 132 lb (59.9 kg)    Fetal Status: Fetal Heart Rate (bpm): 147 Fundal Height: 34 cm Movement: Present  Presentation: Vertex  General:  Alert, oriented and cooperative. Patient is in no acute distress.  Skin: Skin is warm and dry. No rash noted.   Cardiovascular: Normal heart rate noted  Respiratory: Normal respiratory effort, no problems with respiration noted  Abdomen: Soft, gravid, appropriate for gestational age.  Pain/Pressure: Present     Pelvic: Cervical exam deferred        Extremities: Normal range of motion.  Edema: None  Mental Status: Normal mood and affect. Normal behavior. Normal judgment and thought content.   Assessment and Plan:  Pregnancy: [id number] at [redacted]w[redacted]d 1. Supervision of high risk  pregnancy, antepartum Continue prenatal care. Declined cultures due to having a C-section, scheduled  2. Prior Cesarean section For Elective Repeat  3. Maternal iron deficiency anemia complicating pregnancy in third trimester Check labs - CBC  4. Language barrier affecting health care Arabic interpreter: in person used  5. Gestational Diabetes See CBGs,     Preterm labor symptoms and general obstetric precautions including but not limited to vaginal bleeding, contractions, leaking of fluid and fetal movement were reviewed in detail with the patient. Please refer to After Visit Summary for other counseling recommendations.   Return in 1 week (on 03/26/2021) for [facility].  Future Appointments  Date Time Provider Department Center  03/23/2021 11:00 AM [facility] NURSE [facility]  03/23/2021 11:15 AM [facility] US2 [facility]  03/30/2021  9:55 AM [place] Bing, MD [facility]  04/02/2021  9:00 AM [facility] PAT 1 [facility] None    Reva Bores, MD

## 2021-03-20 NOTE — Progress Notes (Signed)
Patient did not connect to Steen video visit. Phone call was placed, no answer.

## 2021-03-23 ENCOUNTER — Ambulatory Visit: Payer: Medicaid Other | Admitting: *Deleted

## 2021-03-23 ENCOUNTER — Ambulatory Visit: Payer: Medicaid Other | Attending: Family Medicine

## 2021-03-23 ENCOUNTER — Other Ambulatory Visit: Payer: Self-pay | Admitting: Nurse Practitioner

## 2021-03-23 ENCOUNTER — Other Ambulatory Visit: Payer: Self-pay

## 2021-03-23 ENCOUNTER — Other Ambulatory Visit: Payer: Self-pay | Admitting: Family Medicine

## 2021-03-23 ENCOUNTER — Encounter: Payer: Self-pay | Admitting: *Deleted

## 2021-03-23 VITALS — BP 109/66 | HR 90

## 2021-03-23 DIAGNOSIS — O09523 Supervision of elderly multigravida, third trimester: Secondary | ICD-10-CM

## 2021-03-23 DIAGNOSIS — O09299 Supervision of pregnancy with other poor reproductive or obstetric history, unspecified trimester: Secondary | ICD-10-CM | POA: Diagnosis present

## 2021-03-23 DIAGNOSIS — O09521 Supervision of elderly multigravida, first trimester: Secondary | ICD-10-CM | POA: Insufficient documentation

## 2021-03-23 DIAGNOSIS — O0990 Supervision of high risk pregnancy, unspecified, unspecified trimester: Secondary | ICD-10-CM | POA: Insufficient documentation

## 2021-03-23 DIAGNOSIS — D509 Iron deficiency anemia, unspecified: Secondary | ICD-10-CM | POA: Diagnosis present

## 2021-03-23 DIAGNOSIS — O43199 Other malformation of placenta, unspecified trimester: Secondary | ICD-10-CM

## 2021-03-23 DIAGNOSIS — O24410 Gestational diabetes mellitus in pregnancy, diet controlled: Secondary | ICD-10-CM | POA: Diagnosis not present

## 2021-03-23 DIAGNOSIS — O99013 Anemia complicating pregnancy, third trimester: Secondary | ICD-10-CM | POA: Insufficient documentation

## 2021-03-23 NOTE — Patient Instructions (Signed)
Cherly [id number]  03/23/2021   Your procedure is scheduled on:  04/04/2021  Arrive at 0730 at Entrance C on [address] at [facility]  and [employer]. You are invited to use the FREE valet parking or use the Visitor's parking deck.  Pick up the phone at the desk and dial [phone].  Call this number if you have problems the morning of surgery: [phone]  Remember:   Do not eat food:(After Midnight) Desps de medianoche.  Do not drink clear liquids: (After Midnight) Desps de medianoche.  Take these medicines the morning of surgery with A SIP OF WATER:  none   Do not wear jewelry, make-up or nail polish.  Do not wear lotions, powders, or perfumes. Do not wear deodorant.  Do not shave 48 hours prior to surgery.  Do not bring valuables to the hospital.  [facility] is not   responsible for any belongings or valuables brought to the hospital.  Contacts, dentures or bridgework may not be worn into surgery.  Leave suitcase in the car. After surgery it may be brought to your room.  For patients admitted to the hospital, checkout time is 11:00 AM the day of              discharge.      Please read over the following fact sheets that you were given:     Preparing for Surgery

## 2021-03-24 NOTE — Pre-Procedure Instructions (Signed)
Interpreter number [phone]

## 2021-03-25 ENCOUNTER — Telehealth (HOSPITAL_COMMUNITY): Payer: Self-pay | Admitting: *Deleted

## 2021-03-25 NOTE — Pre-Procedure Instructions (Signed)
Interpreter number [phone]

## 2021-03-25 NOTE — Telephone Encounter (Signed)
Preadmission screen  

## 2021-03-26 ENCOUNTER — Encounter (HOSPITAL_COMMUNITY): Payer: Self-pay

## 2021-03-26 NOTE — Pre-Procedure Instructions (Signed)
Interpreter number [phone]

## 2021-03-30 ENCOUNTER — Other Ambulatory Visit: Payer: Self-pay

## 2021-03-30 ENCOUNTER — Ambulatory Visit (INDEPENDENT_AMBULATORY_CARE_PROVIDER_SITE_OTHER): Payer: 59 | Admitting: Obstetrics and Gynecology

## 2021-03-30 ENCOUNTER — Encounter (HOSPITAL_COMMUNITY): Payer: Self-pay | Admitting: Obstetrics & Gynecology

## 2021-03-30 VITALS — BP 104/72 | HR 96 | Wt 127.7 lb

## 2021-03-30 DIAGNOSIS — O34219 Maternal care for unspecified type scar from previous cesarean delivery: Secondary | ICD-10-CM

## 2021-03-30 DIAGNOSIS — D509 Iron deficiency anemia, unspecified: Secondary | ICD-10-CM

## 2021-03-30 DIAGNOSIS — O0990 Supervision of high risk pregnancy, unspecified, unspecified trimester: Secondary | ICD-10-CM

## 2021-03-30 DIAGNOSIS — O368130 Decreased fetal movements, third trimester, not applicable or unspecified: Secondary | ICD-10-CM | POA: Diagnosis not present

## 2021-03-30 DIAGNOSIS — O43199 Other malformation of placenta, unspecified trimester: Secondary | ICD-10-CM

## 2021-03-30 DIAGNOSIS — O99013 Anemia complicating pregnancy, third trimester: Secondary | ICD-10-CM

## 2021-03-30 DIAGNOSIS — Z3A38 38 weeks gestation of pregnancy: Secondary | ICD-10-CM

## 2021-03-30 DIAGNOSIS — Z5941 Food insecurity: Secondary | ICD-10-CM

## 2021-03-30 DIAGNOSIS — J339 Nasal polyp, unspecified: Secondary | ICD-10-CM | POA: Insufficient documentation

## 2021-03-30 DIAGNOSIS — Z789 Other specified health status: Secondary | ICD-10-CM

## 2021-03-30 DIAGNOSIS — O09521 Supervision of elderly multigravida, first trimester: Secondary | ICD-10-CM

## 2021-03-30 DIAGNOSIS — O24410 Gestational diabetes mellitus in pregnancy, diet controlled: Secondary | ICD-10-CM

## 2021-03-30 NOTE — Progress Notes (Signed)
left

## 2021-03-30 NOTE — Patient Instructions (Signed)
Humidifier  Nasal (none) saline spray

## 2021-03-30 NOTE — Progress Notes (Signed)
° °  PRENATAL VISIT NOTE  Subjective:  Kelsey Ramirez is a 36 y.o. [id number] at [redacted]w[redacted]d being seen today for ongoing prenatal care.  She is currently monitored for the following issues for this high-risk pregnancy and has Previous cesarean delivery, antepartum; Language barrier affecting health care; Female circumcision; History of miscarriage, currently pregnant; Complication of anesthesia; Supervision of high risk pregnancy, antepartum; AMA (advanced maternal age) multigravida 35+, first trimester; Marginal insertion of umbilical cord affecting management of mother; Gestational diabetes mellitus (GDM); Cervical polyp; Vaginal bleeding during pregnancy; Maternal iron deficiency anemia complicating pregnancy in third trimester; and Nasal polyp on their problem list.  Patient reports  ?decreased FM . Pt states she's had nose bleeds throughout pregnant but a little worse lately and left>right Contractions: Not present. Vag. Bleeding: None.  Movement: (!) Decreased. Denies leaking of fluid.   The following portions of the patient's history were reviewed and updated as appropriate: allergies, current medications, past family history, past medical history, past social history, past surgical history and problem list.   Objective:   Vitals:   03/30/21 1004  BP: 104/72  Pulse: 96  Weight: 127 lb 11.2 oz (57.9 kg)    Fetal Status: Fetal Heart Rate (bpm): 152   Movement: (!) Decreased     General:  Alert, oriented and cooperative. Patient is in no acute distress.  Skin: Skin is warm and dry. No rash noted.   Cardiovascular: Normal heart rate noted  Respiratory: Normal respiratory effort, no problems with respiration noted  Abdomen: Soft, gravid, appropriate for gestational age.  Pain/Pressure: Present     Pelvic: Cervical exam deferred        Extremities: Normal range of motion.  Edema: None  Mental Status: Normal mood and affect. Normal behavior. Normal judgment and thought content.  Nose: polyp seen in  left nostril  Assessment and Plan:  Pregnancy: P5T6144 at [redacted]w[redacted]d 1. Food insecurity - AMBULATORY REFERRAL TO [facility]  2. Nasal polyp Referred to ENT. ED preautions given and to try using a humidifier at night nasal saline spray during the day  3. [redacted] weeks gestation of pregnancy Declines gbs swab. Pt unsure about BC. Doesn't want to do anything at time of c-section  4. Diet controlled gestational diabetes mellitus (GDM) in third trimester Normal CBG log  5. Supervision of high risk pregnancy, antepartum  6. Previous cesarean delivery, antepartum Scheduled for 1/26  7. Maternal iron deficiency anemia complicating pregnancy in third trimester Continue po iron  8. Marginal insertion of umbilical cord affecting management of mother No issues  9. Language barrier affecting health care Interpreter used  10. AMA (advanced maternal age) multigravida 35+, first trimester No issues  11. Decreased Fetal Movement Reactive NST today  Term labor symptoms and general obstetric precautions including but not limited to vaginal bleeding, contractions, leaking of fluid and fetal movement were reviewed in detail with the patient. Please refer to After Visit Summary for other counseling recommendations.   Return in about 2 weeks (around 04/13/2021) for RN in person incision check .  Future Appointments  Date Time Provider Department Center  04/02/2021  9:00 AM [facility] PAT 1 [facility] None    Deweese Bing, MD

## 2021-04-01 ENCOUNTER — Encounter (HOSPITAL_COMMUNITY): Payer: Self-pay | Admitting: Obstetrics & Gynecology

## 2021-04-01 ENCOUNTER — Inpatient Hospital Stay (EMERGENCY_DEPARTMENT_HOSPITAL)
Admission: AD | Admit: 2021-04-01 | Discharge: 2021-04-01 | Disposition: A | Discharge status: Home / Self Care | Payer: 59 | Source: Home / Self Care | Attending: Obstetrics and Gynecology | Admitting: Obstetrics and Gynecology

## 2021-04-01 ENCOUNTER — Telehealth: Payer: Self-pay

## 2021-04-01 ENCOUNTER — Encounter (HOSPITAL_COMMUNITY): Payer: Self-pay | Admitting: Obstetrics and Gynecology

## 2021-04-01 ENCOUNTER — Inpatient Hospital Stay (HOSPITAL_COMMUNITY)
Admission: AD | Admit: 2021-04-01 | Discharge: 2021-04-04 | DRG: 786 | Disposition: A | Discharge status: Home / Self Care | Payer: 59 | Attending: Obstetrics & Gynecology | Admitting: Obstetrics & Gynecology

## 2021-04-01 ENCOUNTER — Other Ambulatory Visit: Payer: Self-pay

## 2021-04-01 DIAGNOSIS — Z98891 History of uterine scar from previous surgery: Secondary | ICD-10-CM

## 2021-04-01 DIAGNOSIS — O34219 Maternal care for unspecified type scar from previous cesarean delivery: Secondary | ICD-10-CM | POA: Insufficient documentation

## 2021-04-01 DIAGNOSIS — O99893 Other specified diseases and conditions complicating puerperium: Secondary | ICD-10-CM | POA: Diagnosis not present

## 2021-04-01 DIAGNOSIS — O43199 Other malformation of placenta, unspecified trimester: Secondary | ICD-10-CM | POA: Diagnosis present

## 2021-04-01 DIAGNOSIS — Z3A38 38 weeks gestation of pregnancy: Secondary | ICD-10-CM | POA: Insufficient documentation

## 2021-04-01 DIAGNOSIS — O34211 Maternal care for low transverse scar from previous cesarean delivery: Secondary | ICD-10-CM | POA: Diagnosis not present

## 2021-04-01 DIAGNOSIS — O471 False labor at or after 37 completed weeks of gestation: Secondary | ICD-10-CM | POA: Insufficient documentation

## 2021-04-01 DIAGNOSIS — U071 COVID-19: Secondary | ICD-10-CM | POA: Insufficient documentation

## 2021-04-01 DIAGNOSIS — O09521 Supervision of elderly multigravida, first trimester: Secondary | ICD-10-CM | POA: Diagnosis present

## 2021-04-01 DIAGNOSIS — O24420 Gestational diabetes mellitus in childbirth, diet controlled: Secondary | ICD-10-CM | POA: Diagnosis present

## 2021-04-01 DIAGNOSIS — J101 Influenza due to other identified influenza virus with other respiratory manifestations: Secondary | ICD-10-CM | POA: Diagnosis present

## 2021-04-01 DIAGNOSIS — O4693 Antepartum hemorrhage, unspecified, third trimester: Secondary | ICD-10-CM | POA: Diagnosis present

## 2021-04-01 DIAGNOSIS — O0990 Supervision of high risk pregnancy, unspecified, unspecified trimester: Secondary | ICD-10-CM

## 2021-04-01 DIAGNOSIS — O9081 Anemia of the puerperium: Secondary | ICD-10-CM | POA: Diagnosis not present

## 2021-04-01 DIAGNOSIS — O479 False labor, unspecified: Secondary | ICD-10-CM

## 2021-04-01 DIAGNOSIS — O99013 Anemia complicating pregnancy, third trimester: Secondary | ICD-10-CM

## 2021-04-01 DIAGNOSIS — O09299 Supervision of pregnancy with other poor reproductive or obstetric history, unspecified trimester: Secondary | ICD-10-CM

## 2021-04-01 DIAGNOSIS — N90810 Female genital mutilation status, unspecified: Secondary | ICD-10-CM | POA: Diagnosis present

## 2021-04-01 DIAGNOSIS — O98519 Other viral diseases complicating pregnancy, unspecified trimester: Secondary | ICD-10-CM | POA: Diagnosis present

## 2021-04-01 DIAGNOSIS — O43123 Velamentous insertion of umbilical cord, third trimester: Secondary | ICD-10-CM | POA: Diagnosis present

## 2021-04-01 DIAGNOSIS — H538 Other visual disturbances: Secondary | ICD-10-CM | POA: Diagnosis not present

## 2021-04-01 DIAGNOSIS — O9952 Diseases of the respiratory system complicating childbirth: Secondary | ICD-10-CM | POA: Diagnosis present

## 2021-04-01 DIAGNOSIS — O9852 Other viral diseases complicating childbirth: Secondary | ICD-10-CM | POA: Diagnosis present

## 2021-04-01 DIAGNOSIS — Z603 Acculturation difficulty: Secondary | ICD-10-CM | POA: Diagnosis present

## 2021-04-01 DIAGNOSIS — D62 Acute posthemorrhagic anemia: Secondary | ICD-10-CM | POA: Diagnosis not present

## 2021-04-01 DIAGNOSIS — O24419 Gestational diabetes mellitus in pregnancy, unspecified control: Secondary | ICD-10-CM | POA: Diagnosis present

## 2021-04-01 DIAGNOSIS — Z789 Other specified health status: Secondary | ICD-10-CM | POA: Diagnosis present

## 2021-04-01 LAB — TYPE AND SCREEN
ABO/RH(D): O POS
Antibody Screen: NEGATIVE

## 2021-04-01 LAB — CBC
HCT: 36.9 % (ref 36.0–46.0)
Hemoglobin: 12.1 g/dL (ref 12.0–15.0)
MCH: 28.4 pg (ref 26.0–34.0)
MCHC: 32.8 g/dL (ref 30.0–36.0)
MCV: 86.6 fL (ref 80.0–100.0)
Platelets: 255 10*3/uL (ref 150–400)
RBC: 4.26 MIL/uL (ref 3.87–5.11)
RDW: 16.6 % — ABNORMAL HIGH (ref 11.5–15.5)
WBC: 6.1 10*3/uL (ref 4.0–10.5)
nRBC: 0 % (ref 0.0–0.2)

## 2021-04-01 LAB — RESP PANEL BY RT-PCR (FLU A&B, COVID) ARPGX2
Influenza A by PCR: POSITIVE — AB
Influenza B by PCR: NEGATIVE
SARS Coronavirus 2 by RT PCR: POSITIVE — AB

## 2021-04-01 MED ORDER — SOD CITRATE-CITRIC ACID 500-334 MG/5ML PO SOLN
30.0000 mL | ORAL | Status: AC
  Administered 2021-04-02: 30 mL via ORAL
  Filled 2021-04-01: qty 30

## 2021-04-01 MED ORDER — ACETAMINOPHEN 325 MG PO TABS: 650.0000 mg | ORAL_TABLET | ORAL | Status: DC | PRN

## 2021-04-01 MED ORDER — CALCIUM CARBONATE ANTACID 500 MG PO CHEW: 2.0000 | CHEWABLE_TABLET | ORAL | Status: DC | PRN

## 2021-04-01 MED ORDER — SODIUM CHLORIDE 0.9 % IV SOLN
2.0000 g | INTRAVENOUS | Status: AC
  Administered 2021-04-02: 2 g via INTRAVENOUS
  Filled 2021-04-01: qty 2

## 2021-04-01 MED ORDER — ZOLPIDEM TARTRATE 5 MG PO TABS: 5.0000 mg | ORAL_TABLET | Freq: Every evening | ORAL | Status: DC | PRN

## 2021-04-01 MED ORDER — GABAPENTIN 300 MG PO CAPS
300.0000 mg | ORAL_CAPSULE | ORAL | Status: AC
  Administered 2021-04-02: 300 mg via ORAL
  Filled 2021-04-01 (×2): qty 1

## 2021-04-01 MED ORDER — ACETAMINOPHEN 500 MG PO TABS
1000.0000 mg | ORAL_TABLET | ORAL | Status: AC
  Administered 2021-04-02: 1000 mg via ORAL
  Filled 2021-04-01: qty 2

## 2021-04-01 MED ORDER — POVIDONE-IODINE 10 % EX SWAB: 2.0000 "application " | Freq: Once | CUTANEOUS | Status: DC

## 2021-04-01 MED ORDER — LACTATED RINGERS IV SOLN: INTRAVENOUS | Status: DC

## 2021-04-01 NOTE — Telephone Encounter (Signed)
Patient's husband left VM on nurse line requesting assistance with FMLA paperwork.

## 2021-04-01 NOTE — H&P (Signed)
Obstetric Preoperative History and Physical  [employer] Arabic interpreter used for this encounter  Kelsey Ramirez is a 36 y.o. 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 Delivery indicated, but patient last ate at around 2000. Her cesarean section is being scheduled for tomorrow morning, earlier if need for any maternal-fetal indications. She reports good fetal movement, no leaking of fluid.  No acute preoperative concerns.    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 Will get growth [place]  Flu Vaccine  na Genetic/Carrier Screen  NIPS:   declined AFP:   declined Horizon:  TDaP Vaccine   Declined Hgb A1C or  GTT Early A1C 5.2 Third trimester   COVID Vaccine No   LAB RESULTS   Rhogam  na Blood Type O/Positive/-- (08/05 1013)   Baby Feeding Plan Both Antibody Negative (08/05 1013)   Contraception Undecided  Rubella 1.96 (08/05 1013)  Circumcision Undecided RPR Non Reactive (08/05 1013)   [facility] HBsAg Negative (08/05 1013)   Support Person Kelsey Ramirez ( FOB) HCVAb Negative (08/05 1013)  Prenatal Classes  HIV Non Reactive (08/05 1013)     BTL Consent NA GBS Not done  VBAC Consent  Pap        DME Rx Valu.Nieves ] BP cuff [ ]  Weight Scale Waterbirth  [ ]  Class [ ]  Consent [ ]  CNM visit  PHQ9 & GAD7 [  ] new OB [  ] 28 weeks  [  ] 36 weeks Induction  [ ]  Orders Entered [ ] Foley Y/N    Prenatal Transfer Tool  Maternal Diabetes: Yes:  Diabetes Type:  Diet controlled Genetic Screening: Normal Maternal Ultrasounds/Referrals: Normal Fetal Ultrasounds or other Referrals:  None Maternal Substance Abuse:  No Significant Maternal Medications:  None Significant Maternal Lab Results: None  Past Medical History:  Diagnosis Date   Anemia    last pregnancy   Cervical polyp    Complication of anesthesia    Feels Epidural caused BP to drop   Female circumcision    Gestational diabetes    Menorrhagia 06/19/2020   Nasal polyp     Past Surgical History:  Procedure Laterality Date   CESAREAN SECTION     CESAREAN SECTION N/A 12/30/2016   Procedure: REPEAT CESAREAN SECTION;  Surgeon: Kelsey Kind, MD;  Location: [facility];  Service: Obstetrics;  Laterality: N/A;    OB History  Gravida Para Term Preterm AB Living  4 2 2    1  2  SAB IAB Ectopic Multiple Live Births  1     0 2    # Outcome Date GA Lbr Len/2nd Weight Sex Delivery Anes PTL Lv  4 Current           3 Term 12/30/16 [redacted]w[redacted]d  3035 g M CS-LTranv EPI  LIV     Birth Comments: wnl  2 Term 09/10/15 [redacted]w[redacted]d  2700 g M CS-LTranv   LIV     Complications: Delivery by elective cesarean section  1 SAB             Obstetric Comments  G1: in [place]. Pt states was elective due to female circ.     Social History   Socioeconomic History   Marital status: Married    Spouse name:  Not on file   Number of children: 2   Years of education: Not on file   Highest education level: Not on file  Occupational History   Not on file  Tobacco Use   Smoking status: Never   Smokeless tobacco: Never  Vaping Use   Vaping Use: Never used  Substance and Sexual Activity   Alcohol use: No   Drug use: No   Sexual activity: Not Currently    Birth control/protection: None  Other Topics Concern   Not on file  Social History Narrative   Not on file   Social Determinants of Health   Financial Resource Strain: Not on file  Food Insecurity: No Food Insecurity   Worried About Running Out of Food in the Last Year: Never true   [place] in the Last Year: Never true  Transportation Needs: No Transportation Needs   Lack of Transportation (Medical): No   Lack of Transportation (Non-Medical): No  Physical Activity: Not on file  Stress: Not on file  Social Connections: Not on file    Family History  Problem Relation Age of Onset   Healthy Mother    Healthy Father    Cancer Neg Hx    Diabetes Neg Hx    Hypertension Neg Hx     Medications Prior to Admission  Medication Sig Dispense Refill Last Dose   Accu-Chek Softclix Lancets lancets Use as instructed 100 each 12    Blood Pressure Monitoring DEVI 1 each by Does not apply route once a week. 1 each 0    docusate sodium (COLACE) 100 MG capsule Take 1 capsule (100 mg total) by mouth 3 (three) times daily. (Patient not taking: Reported on 02/23/2021) 10 capsule 0    glucose blood (ACCU-CHEK GUIDE) test strip Use as instructed 100 each 12    omeprazole (PRILOSEC) 40 MG capsule Take 1 capsule (40 mg total) by mouth daily. (Patient not taking: Reported on 03/19/2021) 30 capsule 1    Prenatal Vit-Fe Fumarate-FA (PREPLUS) 27-1 MG TABS Take 1 tablet by mouth daily. 30 tablet 13     No Known Allergies  Review of Systems: Pertinent items noted in HPI and remainder of comprehensive ROS otherwise negative.  Physical Exam: BP  128/72 (BP Location: Left Arm)    Pulse 100    Temp 98 F (36.7 C) (Oral)    Resp 17    LMP 07/05/2020 (Exact Date)    SpO2 100%  FHR tracing:  140s, +mod variability, +accels, no decels CONSTITUTIONAL: Well-developed, well-nourished female in no acute distress.  HENT:  Normocephalic, atraumatic, External right and left ear normal. Oropharynx is clear and moist EYES: Conjunctivae and EOM are normal. Pupils  are equal, round, and reactive to light. No scleral icterus.  NECK: Normal range of motion, supple, no masses SKIN: Skin is warm and dry. No rash noted. Not diaphoretic. No erythema. No pallor. NEUROLOGIC: Alert and oriented to person, place, and time. Normal reflexes, muscle tone coordination. No cranial nerve deficit noted. PSYCHIATRIC: Normal mood and affect. Normal behavior. Normal judgment and thought content. CARDIOVASCULAR: Normal heart rate noted, regular rhythm RESPIRATORY: Effort and breath sounds normal, no problems with respiration noted ABDOMEN: Soft, nontender, nondistended, gravid. Well-healed Pfannenstiel incision. PELVIC: Refused speculum exam. No blood seen on introitus. With much difficulty, was able to place cotton swab into vagina and brown old blood noted, no active bleeding. Done in the presence of a chaperone. MUSCULOSKELETAL: Normal range of motion. No edema and no tenderness. 2+ distal pulses.  Pertinent Labs/Studies:   Results for orders placed or performed during the hospital encounter of 04/01/21 (from the past 72 hour(s))  CBC     Status: Abnormal   Collection Time: 04/01/21  1:29 PM  Result Value Ref Range   WBC 6.1 4.0 - 10.5 K/uL   RBC 4.26 3.87 - 5.11 MIL/uL   Hemoglobin 12.1 12.0 - 15.0 g/dL   HCT 16.136.9 09.636.0 - 04.546.0 %   MCV 86.6 80.0 - 100.0 fL   MCH 28.4 26.0 - 34.0 pg   MCHC 32.8 30.0 - 36.0 g/dL   RDW 40.916.6 (H) 81.111.5 - 91.415.5 %   Platelets 255 150 - 400 K/uL   nRBC 0.0 0.0 - 0.2 %    Comment: Performed at [facility], [address]  [place], [place] [address]  Type and screen [facility]     Status: None   Collection Time: 04/01/21  1:30 PM  Result Value Ref Range   ABO/RH(D) O POS    Antibody Screen NEG    Sample Expiration      04/04/2021,2359 Performed at [facility], [address] [place], [place] [address]   Resp Panel by RT-PCR (Flu A&B, Covid) Nasopharyngeal Swab     Status: Abnormal   Collection Time: 04/01/21  2:35 PM   Specimen: Nasopharyngeal Swab; Nasopharyngeal(NP) swabs in vial transport medium  Result Value Ref Range   SARS Coronavirus 2 by RT PCR POSITIVE (A) NEGATIVE    Comment: (NOTE) SARS-CoV-2 target nucleic acids are DETECTED.  The SARS-CoV-2 RNA is generally detectable in upper respiratory specimens during the acute phase of infection. Positive results are indicative of the presence of the identified virus, but do not rule out bacterial infection or co-infection with other pathogens not detected by the test. Clinical correlation with patient history and other diagnostic information is necessary to determine patient infection status. The expected result is Negative.  Fact Sheet for Patients: BloggerCourse.comhttps://www.fda.gov/media/152166/download  Fact Sheet for Healthcare Providers: SeriousBroker.ithttps://www.fda.gov/media/152162/download  This test is not yet approved or cleared by the [place] FDA and  has been authorized for detection and/or diagnosis of SARS-CoV-2 by FDA under an Emergency Use Authorization (EUA).  This EUA will remain in effect (meaning this test can be used) for the duration of  the COVID-19 declaration under Section 564(b)(1) of the A ct, 21 U.S.C. section 360bbb-3(b)(1), unless the authorization is terminated or revoked sooner.     Influenza A by PCR POSITIVE (A) NEGATIVE   Influenza B by PCR NEGATIVE NEGATIVE    Comment: (NOTE) The Xpert Xpress SARS-CoV-2/FLU/RSV plus assay is intended as an aid in the diagnosis of influenza from Nasopharyngeal  swab  specimens and should not be used as a sole basis for treatment. Nasal washings and aspirates are unacceptable for Xpert Xpress SARS-CoV-2/FLU/RSV testing.  Fact Sheet for Patients: EntrepreneurPulse.com.au  Fact Sheet for Healthcare Providers: IncredibleEmployment.be  This test is not yet approved or cleared by the [place] FDA and has been authorized for detection and/or diagnosis of SARS-CoV-2 by FDA under an Emergency Use Authorization (EUA). This EUA will remain in effect (meaning this test can be used) for the duration of the COVID-19 declaration under Section 564(b)(1) of the Act, 21 U.S.C. section 360bbb-3(b)(1), unless the authorization is terminated or revoked.  Performed at [facility], [place] [address] [place], [place] [address]     Assessment and Plan: Kelsey Ramirez is a 36 y.o. [id number] at [redacted]w[redacted]d being admitted for repeat cesarean section. The risks of surgery were discussed with the patient including but were not limited to: bleeding which may require transfusion or reoperation; infection which may require antibiotics; injury to bowel, bladder, ureters or other surrounding organs; injury to the fetus; need for additional procedures including hysterectomy in the event of a life-threatening hemorrhage; formation of adhesions; placental abnormalities wth subsequent pregnancies; incisional problems; thromboembolic phenomenon and other postoperative/anesthesia complications. The patient concurred with the proposed plan, giving informed written consent for the procedure. Patient has been NPO since 2000 last night she will remain NPO for procedure. Procedure tentatively scheduled for 0900 tomorrow 04/01/21, there is a possibility of going earlier for increased bleeding or significant maternal-fetal concerns.  Anesthesia and OR aware. Preoperative prophylactic antibiotics and SCDs ordered on call to the OR. Will be admitted to [facility] for now,  orders placed.   Patient is positve for COVID and Influenza A and is on droplet precautions. She is asymptomatic. She understands the need for PPE for staff caring for her and her infant and visitor limitations.     Kelsey Schneiders, MD, [place] [facility] [employer], [place]

## 2021-04-01 NOTE — MAU Note (Signed)
Kelsey Ramirez is a 36 y.o. at [redacted]w[redacted]d here in MAU reporting: since yesterday has been having contractions. Yesterday they were every 10-15 minutes and today they are about every 30 minutes. No bleeding or LOF. +FM.  Onset of complaint: yesterday  Pain score: 3/10  Vitals:   04/01/21 1052  BP: 113/71  Pulse: 81  Resp: 16  Temp: 97.7 F (36.5 C)  SpO2: 100%     FHT: +FM  Lab orders placed from triage: none

## 2021-04-01 NOTE — MAU Provider Note (Signed)
MAU Labor Evaluation   First Provider Initiated Contact with Patient 04/01/21 1256      S: [name] is a 36 y.o. [id number] at [redacted]w[redacted]d  who presents to MAU today complaining contractions q 30 minutes since earlier this morning, however they were about 10-15 minutes apart yesterday. She denies vaginal bleeding. She denies LOF. She reports normal fetal movement. She rates the contractions as "moderate".     She has a history of 2 c-sections and planning for repeat. Scheduled for 1/28 with Dr. Debroah Loop. She is extremely hesitant to have any vaginal exam.   O: BP 113/71 (BP Location: Right Arm)    Pulse 81    Temp 97.7 F (36.5 C) (Oral)    Resp 16    Ht 5' (1.524 m)    Wt 57.6 kg    LMP 07/05/2020 (Exact Date)    SpO2 100% Comment: room air   BMI 24.80 kg/m   GENERAL: Well-developed, well-nourished female in no acute distress.  HEAD: Normocephalic, atraumatic.  CHEST: Normal effort of breathing ABDOMEN: Soft, non-tender  Cervical exam:  After a conversation with the patient, she was willing to have a cervical exam. Attempted a cervical exam with a chaperone. Unable to tolerate. Unable to advance hand past her introitus and stopped by patient.     Fetal Monitoring: Monitored for >3 hours while in the MAU.   Baseline: 120 Variability: moderate Accelerations: several 15x15 Decelerations: None Contractions: every 7-20 minutes, on average about every 12 minutes.  She is not feeling every contraction occurring on the toco. Feeling them about every 30 minutes. When she does feel a contraction, winces her face but able to talk.   A: SIUP at [redacted]w[redacted]d  False labor, may be early latent.  Reactive NST   P: Discussed presentation and findings with Dr. Ashok Pall at arrival and after observation in the MAU. Labs and COVID swab were added when she initially presented in case she would stay, this will take place of her pre-op labs she was going to have tomorrow.   Despite no cervical exam, she does not  appear to be in labor at this time. Discharge home. Strict MAU return precautions discussed (stronger, closer contractions (around 5-59min), LOF, DFM, vaginal bleeding). She has reliable transportation.    Kelsey Stack, DO 04/01/2021 2:26 PM

## 2021-04-01 NOTE — MAU Note (Signed)
Pt was here in MAU earlier today and was d/c came back tonight reporting bleeding that started an hour ago. States it began as spotting but got heavier on the way to the hospital. Denies needing to use a pad. "I had an issue with the placenta in December; I'm afraid I have the issue again". Reports irregular ctx "sometimes every 10 minutes.. sometimes every 20 minutes" denies LOF. Movement was strong but "now not that much".

## 2021-04-01 NOTE — MAU Note (Signed)
Pre op labs and covid swab obtained before d/c.

## 2021-04-01 NOTE — MAU Note (Signed)
On going, pains would wake her. No bleeding or leaking. +FM

## 2021-04-02 ENCOUNTER — Other Ambulatory Visit (HOSPITAL_COMMUNITY)
Admission: RE | Admit: 2021-04-02 | Discharge: 2021-04-02 | Disposition: A | Discharge status: Home / Self Care | Payer: Medicaid Other | Source: Ambulatory Visit | Attending: Family Medicine | Admitting: Family Medicine

## 2021-04-02 ENCOUNTER — Encounter (HOSPITAL_COMMUNITY)
Admission: AD | Disposition: A | Discharge status: Home / Self Care | Payer: Self-pay | Source: Home / Self Care | Attending: Obstetrics & Gynecology

## 2021-04-02 ENCOUNTER — Inpatient Hospital Stay (HOSPITAL_COMMUNITY): Payer: 59 | Admitting: Anesthesiology

## 2021-04-02 ENCOUNTER — Encounter (HOSPITAL_COMMUNITY): Payer: Self-pay | Admitting: Obstetrics & Gynecology

## 2021-04-02 ENCOUNTER — Inpatient Hospital Stay (HOSPITAL_COMMUNITY)
Admission: RE | Admit: 2021-04-02 | Payer: Medicaid Other | Source: Home / Self Care | Admitting: Obstetrics and Gynecology

## 2021-04-02 ENCOUNTER — Other Ambulatory Visit: Payer: Self-pay | Admitting: Obstetrics & Gynecology

## 2021-04-02 DIAGNOSIS — Z3A38 38 weeks gestation of pregnancy: Secondary | ICD-10-CM | POA: Diagnosis not present

## 2021-04-02 DIAGNOSIS — O34219 Maternal care for unspecified type scar from previous cesarean delivery: Secondary | ICD-10-CM | POA: Diagnosis not present

## 2021-04-02 DIAGNOSIS — U071 COVID-19: Secondary | ICD-10-CM | POA: Diagnosis present

## 2021-04-02 DIAGNOSIS — O43199 Other malformation of placenta, unspecified trimester: Secondary | ICD-10-CM

## 2021-04-02 DIAGNOSIS — O9852 Other viral diseases complicating childbirth: Secondary | ICD-10-CM | POA: Diagnosis present

## 2021-04-02 DIAGNOSIS — D62 Acute posthemorrhagic anemia: Secondary | ICD-10-CM | POA: Diagnosis not present

## 2021-04-02 DIAGNOSIS — O9081 Anemia of the puerperium: Secondary | ICD-10-CM | POA: Diagnosis not present

## 2021-04-02 DIAGNOSIS — O479 False labor, unspecified: Secondary | ICD-10-CM | POA: Diagnosis not present

## 2021-04-02 DIAGNOSIS — Z98891 History of uterine scar from previous surgery: Secondary | ICD-10-CM | POA: Insufficient documentation

## 2021-04-02 DIAGNOSIS — O34211 Maternal care for low transverse scar from previous cesarean delivery: Secondary | ICD-10-CM | POA: Diagnosis present

## 2021-04-02 DIAGNOSIS — O9952 Diseases of the respiratory system complicating childbirth: Secondary | ICD-10-CM | POA: Diagnosis present

## 2021-04-02 DIAGNOSIS — O43123 Velamentous insertion of umbilical cord, third trimester: Secondary | ICD-10-CM | POA: Diagnosis present

## 2021-04-02 DIAGNOSIS — J101 Influenza due to other identified influenza virus with other respiratory manifestations: Secondary | ICD-10-CM | POA: Diagnosis present

## 2021-04-02 DIAGNOSIS — O24420 Gestational diabetes mellitus in childbirth, diet controlled: Secondary | ICD-10-CM | POA: Diagnosis present

## 2021-04-02 DIAGNOSIS — H538 Other visual disturbances: Secondary | ICD-10-CM | POA: Diagnosis not present

## 2021-04-02 DIAGNOSIS — O99893 Other specified diseases and conditions complicating puerperium: Secondary | ICD-10-CM | POA: Diagnosis not present

## 2021-04-02 HISTORY — DX: Nasal polyp, unspecified: J33.9

## 2021-04-02 HISTORY — DX: Polyp of cervix uteri: N84.1

## 2021-04-02 LAB — CREATININE, SERUM
Creatinine, Ser: 0.48 mg/dL (ref 0.44–1.00)
GFR, Estimated: >60 mL/min (ref >60)

## 2021-04-02 LAB — CBC
HCT: 36.2 % (ref 36.0–46.0)
Hemoglobin: 12 g/dL (ref 12.0–15.0)
MCH: 28.8 pg (ref 26.0–34.0)
MCHC: 33.1 g/dL (ref 30.0–36.0)
MCV: 87 fL (ref 80.0–100.0)
Platelets: 237 10*3/uL (ref 150–400)
RBC: 4.16 MIL/uL (ref 3.87–5.11)
RDW: 16.3 % — ABNORMAL HIGH (ref 11.5–15.5)
WBC: 10.8 10*3/uL — ABNORMAL HIGH (ref 4.0–10.5)
nRBC: 0 % (ref 0.0–0.2)

## 2021-04-02 LAB — GLUCOSE, CAPILLARY: Glucose-Capillary: 85 mg/dL (ref 70–99)

## 2021-04-02 LAB — RPR: RPR Ser Ql: NONREACTIVE

## 2021-04-02 SURGERY — Surgical Case
Anesthesia: Epidural

## 2021-04-02 MED ORDER — ONDANSETRON HCL 4 MG/2ML IJ SOLN
INTRAMUSCULAR | Status: DC | PRN
  Administered 2021-04-02: 4 mg via INTRAVENOUS

## 2021-04-02 MED ORDER — COCONUT OIL OIL: 1.0000 "application " | TOPICAL_OIL | Status: DC | PRN

## 2021-04-02 MED ORDER — OXYTOCIN-SODIUM CHLORIDE 30-0.9 UT/500ML-% IV SOLN: 2.5000 [IU]/h | INTRAVENOUS | Status: AC

## 2021-04-02 MED ORDER — OXYCODONE HCL 5 MG PO TABS: 5.0000 mg | ORAL_TABLET | Freq: Once | ORAL | Status: DC | PRN

## 2021-04-02 MED ORDER — OXYCODONE HCL 5 MG/5ML PO SOLN: 5.0000 mg | Freq: Once | ORAL | Status: DC | PRN

## 2021-04-02 MED ORDER — OXYCODONE HCL 5 MG PO TABS
5.0000 mg | ORAL_TABLET | ORAL | Status: DC | PRN
  Filled 2021-04-02: qty 1

## 2021-04-02 MED ORDER — HYDROMORPHONE HCL 1 MG/ML IJ SOLN: 0.2500 mg | INTRAMUSCULAR | Status: DC | PRN

## 2021-04-02 MED ORDER — FERROUS SULFATE 325 (65 FE) MG PO TABS: 325.0000 mg | ORAL_TABLET | ORAL | Status: DC

## 2021-04-02 MED ORDER — SIMETHICONE 80 MG PO CHEW: 80.0000 mg | CHEWABLE_TABLET | ORAL | Status: DC | PRN

## 2021-04-02 MED ORDER — ZOLPIDEM TARTRATE 5 MG PO TABS: 5.0000 mg | ORAL_TABLET | Freq: Every evening | ORAL | Status: DC | PRN

## 2021-04-02 MED ORDER — DIPHENHYDRAMINE HCL 25 MG PO CAPS: 25.0000 mg | ORAL_CAPSULE | ORAL | Status: DC | PRN

## 2021-04-02 MED ORDER — MEPERIDINE HCL 25 MG/ML IJ SOLN: 6.2500 mg | INTRAMUSCULAR | Status: DC | PRN

## 2021-04-02 MED ORDER — TETANUS-DIPHTH-ACELL PERTUSSIS 5-2.5-18.5 LF-MCG/0.5 IM SUSY: 0.5000 mL | PREFILLED_SYRINGE | Freq: Once | INTRAMUSCULAR | Status: DC

## 2021-04-02 MED ORDER — FENTANYL CITRATE (PF) 100 MCG/2ML IJ SOLN
INTRAMUSCULAR | Status: DC | PRN
Start: 2021-04-02 — End: 2021-04-02

## 2021-04-02 MED ORDER — LACTATED RINGERS IV SOLN: INTRAVENOUS | Status: DC

## 2021-04-02 MED ORDER — DIPHENHYDRAMINE HCL 25 MG PO CAPS: 25.0000 mg | ORAL_CAPSULE | Freq: Four times a day (QID) | ORAL | Status: DC | PRN

## 2021-04-02 MED ORDER — DIPHENHYDRAMINE HCL 50 MG/ML IJ SOLN
12.5000 mg | INTRAMUSCULAR | Status: DC | PRN
  Filled 2021-04-02: qty 1

## 2021-04-02 MED ORDER — DIBUCAINE (PERIANAL) 1 % EX OINT: 1.0000 "application " | TOPICAL_OINTMENT | CUTANEOUS | Status: DC | PRN

## 2021-04-02 MED ORDER — MENTHOL 3 MG MT LOZG: 1.0000 | LOZENGE | OROMUCOSAL | Status: DC | PRN

## 2021-04-02 MED ORDER — SCOPOLAMINE 1 MG/3DAYS TD PT72: 1.0000 | MEDICATED_PATCH | Freq: Once | TRANSDERMAL | Status: DC

## 2021-04-02 MED ORDER — NALOXONE HCL 4 MG/10ML IJ SOLN
1.0000 ug/kg/h | INTRAVENOUS | Status: DC | PRN
  Filled 2021-04-02: qty 5

## 2021-04-02 MED ORDER — OXYCODONE-ACETAMINOPHEN 5-325 MG PO TABS: 2.0000 | ORAL_TABLET | ORAL | Status: DC | PRN

## 2021-04-02 MED ORDER — PRENATAL MULTIVITAMIN CH
1.0000 | ORAL_TABLET | Freq: Every day | ORAL | Status: DC
  Administered 2021-04-03: 1 via ORAL
  Filled 2021-04-02 (×2): qty 1

## 2021-04-02 MED ORDER — ONDANSETRON HCL 4 MG/2ML IJ SOLN: 4.0000 mg | Freq: Three times a day (TID) | INTRAMUSCULAR | Status: DC | PRN

## 2021-04-02 MED ORDER — MEASLES, MUMPS & RUBELLA VAC IJ SOLR: 0.5000 mL | Freq: Once | INTRAMUSCULAR | Status: DC

## 2021-04-02 MED ORDER — SODIUM CHLORIDE 0.9 % IV SOLN: INTRAVENOUS | Status: DC | PRN

## 2021-04-02 MED ORDER — PROMETHAZINE HCL 25 MG/ML IJ SOLN: 6.2500 mg | INTRAMUSCULAR | Status: DC | PRN

## 2021-04-02 MED ORDER — HYDROMORPHONE HCL 1 MG/ML IJ SOLN: 1.0000 mg | INTRAMUSCULAR | Status: DC | PRN

## 2021-04-02 MED ORDER — PHENYLEPHRINE HCL (PRESSORS) 10 MG/ML IV SOLN
INTRAVENOUS | Status: DC | PRN
  Administered 2021-04-02: 40 ug via INTRAVENOUS
  Administered 2021-04-02 (×3): 120 ug via INTRAVENOUS

## 2021-04-02 MED ORDER — KETOROLAC TROMETHAMINE 30 MG/ML IJ SOLN: 30.0000 mg | Freq: Four times a day (QID) | INTRAMUSCULAR | Status: DC | PRN

## 2021-04-02 MED ORDER — PHENYLEPHRINE HCL-NACL 20-0.9 MG/250ML-% IV SOLN
INTRAVENOUS | Status: DC | PRN
  Administered 2021-04-02: 30 ug/min via INTRAVENOUS

## 2021-04-02 MED ORDER — ACETAMINOPHEN 10 MG/ML IV SOLN: 1000.0000 mg | Freq: Once | INTRAVENOUS | Status: DC | PRN

## 2021-04-02 MED ORDER — KETOROLAC TROMETHAMINE 30 MG/ML IJ SOLN
INTRAMUSCULAR | Status: AC
  Filled 2021-04-02: qty 1

## 2021-04-02 MED ORDER — BUPIVACAINE IN DEXTROSE 0.75-8.25 % IT SOLN
INTRATHECAL | Status: DC | PRN
  Administered 2021-04-02: 1.8 mL via INTRATHECAL

## 2021-04-02 MED ORDER — SODIUM CHLORIDE 0.9% FLUSH: 3.0000 mL | INTRAVENOUS | Status: DC | PRN

## 2021-04-02 MED ORDER — GABAPENTIN 300 MG PO CAPS
300.0000 mg | ORAL_CAPSULE | Freq: Two times a day (BID) | ORAL | Status: DC
  Administered 2021-04-03: 300 mg via ORAL
  Filled 2021-04-02 (×3): qty 1

## 2021-04-02 MED ORDER — NALOXONE HCL 0.4 MG/ML IJ SOLN: 0.4000 mg | INTRAMUSCULAR | Status: DC | PRN

## 2021-04-02 MED ORDER — TRANEXAMIC ACID-NACL 1000-0.7 MG/100ML-% IV SOLN: 1000.0000 mg | INTRAVENOUS | Status: DC

## 2021-04-02 MED ORDER — ENOXAPARIN SODIUM 40 MG/0.4ML IJ SOSY
40.0000 mg | PREFILLED_SYRINGE | INTRAMUSCULAR | Status: DC
  Administered 2021-04-02 – 2021-04-03 (×2): 40 mg via SUBCUTANEOUS
  Filled 2021-04-02 (×2): qty 0.4

## 2021-04-02 MED ORDER — SENNOSIDES-DOCUSATE SODIUM 8.6-50 MG PO TABS
2.0000 | ORAL_TABLET | Freq: Every day | ORAL | Status: DC
  Filled 2021-04-02 (×2): qty 2

## 2021-04-02 MED ORDER — MAGNESIUM HYDROXIDE 400 MG/5ML PO SUSP: 30.0000 mL | ORAL | Status: DC | PRN

## 2021-04-02 MED ORDER — SCOPOLAMINE 1 MG/3DAYS TD PT72
MEDICATED_PATCH | TRANSDERMAL | Status: DC | PRN
  Administered 2021-04-02: 1 via TRANSDERMAL

## 2021-04-02 MED ORDER — DEXAMETHASONE SODIUM PHOSPHATE 4 MG/ML IJ SOLN
INTRAMUSCULAR | Status: DC | PRN
  Administered 2021-04-02: 4 mg via INTRAVENOUS

## 2021-04-02 MED ORDER — TRANEXAMIC ACID-NACL 1000-0.7 MG/100ML-% IV SOLN
INTRAVENOUS | Status: DC | PRN
  Administered 2021-04-02: 1000 mg via INTRAVENOUS

## 2021-04-02 MED ORDER — ACETAMINOPHEN 500 MG PO TABS
1000.0000 mg | ORAL_TABLET | Freq: Four times a day (QID) | ORAL | Status: DC
  Administered 2021-04-02 – 2021-04-04 (×7): 1000 mg via ORAL
  Filled 2021-04-02 (×8): qty 2

## 2021-04-02 MED ORDER — SODIUM CHLORIDE 0.9 % IV SOLN
INTRAVENOUS | Status: AC
  Filled 2021-04-02: qty 2

## 2021-04-02 MED ORDER — ACETAMINOPHEN 10 MG/ML IV SOLN
INTRAVENOUS | Status: DC | PRN
  Administered 2021-04-02: 1000 mg via INTRAVENOUS

## 2021-04-02 MED ORDER — KETOROLAC TROMETHAMINE 30 MG/ML IJ SOLN
30.0000 mg | Freq: Four times a day (QID) | INTRAMUSCULAR | Status: AC
  Administered 2021-04-02 – 2021-04-03 (×4): 30 mg via INTRAVENOUS
  Filled 2021-04-02 (×4): qty 1

## 2021-04-02 MED ORDER — WITCH HAZEL-GLYCERIN EX PADS: 1.0000 "application " | MEDICATED_PAD | CUTANEOUS | Status: DC | PRN

## 2021-04-02 MED ORDER — STERILE WATER FOR IRRIGATION IR SOLN
Status: DC | PRN
  Administered 2021-04-02: 1000 mL

## 2021-04-02 MED ORDER — MORPHINE SULFATE (PF) 0.5 MG/ML IJ SOLN
INTRAMUSCULAR | Status: DC | PRN
  Administered 2021-04-02: 150 ug via INTRATHECAL

## 2021-04-02 MED ORDER — OXYTOCIN-SODIUM CHLORIDE 30-0.9 UT/500ML-% IV SOLN
INTRAVENOUS | Status: DC | PRN
  Administered 2021-04-02: 30 [IU] via INTRAVENOUS

## 2021-04-02 MED ORDER — FENTANYL CITRATE (PF) 100 MCG/2ML IJ SOLN
INTRAMUSCULAR | Status: DC | PRN
  Administered 2021-04-02: 15 ug via INTRATHECAL

## 2021-04-02 MED ORDER — MORPHINE SULFATE (PF) 0.5 MG/ML IJ SOLN: INTRAMUSCULAR | Status: DC | PRN

## 2021-04-02 MED ORDER — SODIUM CHLORIDE 0.9 % IR SOLN
Status: DC | PRN
  Administered 2021-04-02: 1

## 2021-04-02 MED ORDER — IBUPROFEN 600 MG PO TABS
600.0000 mg | ORAL_TABLET | Freq: Four times a day (QID) | ORAL | Status: DC
  Administered 2021-04-03 – 2021-04-04 (×4): 600 mg via ORAL
  Filled 2021-04-02 (×4): qty 1

## 2021-04-02 SURGICAL SUPPLY — 36 items
APL SKNCLS STERI-STRIP NONHPOA (GAUZE/BANDAGES/DRESSINGS)
BENZOIN TINCTURE PRP APPL 2/3 (GAUZE/BANDAGES/DRESSINGS) ×1 IMPLANT
CANISTER SUCT 3000ML PPV (MISCELLANEOUS) ×2 IMPLANT
CHLORAPREP W/TINT 26ML (MISCELLANEOUS) ×2 IMPLANT
DRSG OPSITE POSTOP 4X10 (GAUZE/BANDAGES/DRESSINGS) ×2 IMPLANT
ELECT REM PT RETURN 9FT ADLT (ELECTROSURGICAL) ×2
ELECTRODE REM PT RTRN 9FT ADLT (ELECTROSURGICAL) ×1 IMPLANT
EXTRACTOR VACUUM KIWI (MISCELLANEOUS) ×1 IMPLANT
GAUZE SPONGE 4X4 12PLY STRL LF (GAUZE/BANDAGES/DRESSINGS) ×2 IMPLANT
GLOVE BIOGEL PI IND STRL 7.0 (GLOVE) ×2 IMPLANT
GLOVE BIOGEL PI IND STRL 7.5 (GLOVE) ×1 IMPLANT
GLOVE BIOGEL PI INDICATOR 7.0 (GLOVE) ×2
GLOVE BIOGEL PI INDICATOR 7.5 (GLOVE) ×1
GLOVE SKINSENSE NS SZ7.0 (GLOVE) ×1
GLOVE SKINSENSE STRL SZ7.0 (GLOVE) ×1 IMPLANT
GOWN STRL REUS W/ TWL LRG LVL3 (GOWN DISPOSABLE) ×2 IMPLANT
GOWN STRL REUS W/ TWL XL LVL3 (GOWN DISPOSABLE) ×1 IMPLANT
GOWN STRL REUS W/TWL LRG LVL3 (GOWN DISPOSABLE) ×4
GOWN STRL REUS W/TWL XL LVL3 (GOWN DISPOSABLE) ×2
HEMOSTAT SURGICEL 4X8 (HEMOSTASIS) ×1 IMPLANT
NS IRRIG 1000ML POUR BTL (IV SOLUTION) ×2 IMPLANT
PACK C SECTION WH (CUSTOM PROCEDURE TRAY) ×2 IMPLANT
PAD ABD 7.5X8 STRL (GAUZE/BANDAGES/DRESSINGS) ×2 IMPLANT
PAD OB MATERNITY 4.3X12.25 (PERSONAL CARE ITEMS) ×2 IMPLANT
PAD PREP 24X48 CUFFED NSTRL (MISCELLANEOUS) ×2 IMPLANT
PENCIL SMOKE EVAC W/HOLSTER (ELECTROSURGICAL) ×2 IMPLANT
STRIP CLOSURE SKIN 1/2X4 (GAUZE/BANDAGES/DRESSINGS) ×1 IMPLANT
SUT MNCRL 0 VIOLET CTX 36 (SUTURE) ×2 IMPLANT
SUT MON AB 4-0 PS1 27 (SUTURE) ×2 IMPLANT
SUT MONOCRYL 0 CTX 36 (SUTURE) ×2
SUT PLAIN 2 0 XLH (SUTURE) ×2 IMPLANT
SUT VIC AB 0 CT1 36 (SUTURE) ×4 IMPLANT
SUT VIC AB 3-0 CT1 27 (SUTURE) ×2
SUT VIC AB 3-0 CT1 TAPERPNT 27 (SUTURE) ×1 IMPLANT
TOWEL OR 17X24 6PK STRL BLUE (TOWEL DISPOSABLE) ×4 IMPLANT
WATER STERILE IRR 1000ML POUR (IV SOLUTION) ×2 IMPLANT

## 2021-04-02 NOTE — Discharge Instructions (Signed)
Please call and see if they accept medicaid only:   [facility]  [address] [place], [place] [address] [phone]  [address] [place], [place] [address] [phone]   [facility]  [address] [place], [place] [address] [phone]

## 2021-04-02 NOTE — Lactation Note (Signed)
This note was copied from a baby's chart. Lactation Consultation Note  Patient Name: Kelsey Ramirez [id number] Date: 04/02/2021   Age:37 hours   Female interpreter, "Steward Ros" (ID# [phone]) was used for the following consult. Mom is a P3 who nursed her two other children for 3 mo & 1 mo, respectively. Mom stated that her goal is to nurse for this child for 6 mo.   Mom thinks it took about 1 week for her milk to come to volume with her 2 previous children. Mom felt like she had low milk supply, but also recalls being able to pump 20-30 mL from each breast.   Hand pump with size 21 flange will be brought into room by RN. I told Mom to see which flange fits best. An attempt to review the hand-out, "Bottle feeding your baby" was done, but am unsure if Mom understood even with interpreter's assistance. Review will likely be necessary of this and other education.  RN was given an update & I requested that RN determine if Mom was eligible for a stork pump. RN later notified me that Mom is not eligible for a stork pump.   Lurline Hare [facility] 04/02/2021, 12:39 PM

## 2021-04-02 NOTE — Transfer of Care (Signed)
Immediate Anesthesia Transfer of Care Note  Patient: Kelsey Ramirez  Procedure(s) Performed: CESAREAN SECTION  Patient Location: PACU  Anesthesia Type:Spinal  Level of Consciousness: awake, alert  and oriented  Airway & Oxygen Therapy: Patient Spontanous Breathing  Post-op Assessment: Report given to RN and Post -op Vital signs reviewed and stable  Post vital signs: Reviewed and stable  Last Vitals:  Vitals Value Taken Time  BP 91/73 04/02/21 0615  Temp 36.4 C 04/02/21 0615  Pulse 69 04/02/21 0627  Resp 18 04/02/21 0627  SpO2 99 % 04/02/21 0627  Vitals shown include unvalidated device data.  Last Pain:  Vitals:   04/02/21 0615  TempSrc:   PainSc: 0-No pain         Complications: No notable events documented.

## 2021-04-02 NOTE — Discharge Summary (Addendum)
Arabic interpreter via iPad Stratus interpreter used for entirety of encounter.     Postpartum Discharge Summary     Patient Name: Kelsey Ramirez DOB: 10-26-1985 MRN: [id number]  Date of admission: 04/01/2021 Delivery date:04/02/2021  Delivering provider: Verita Schneiders A  Date of discharge: 04/04/2021  Admitting diagnosis: S/P cesarean section [Z98.891] Previous cesarean delivery, antepartum [O34.219] Intrauterine pregnancy: [redacted]w[redacted]d    Secondary diagnosis:  Principal Problem:   Cesarean delivery delivered Active Problems:   Previous cesarean delivery, antepartum   Language barrier affecting health care   Female circumcision   Supervision of high risk pregnancy, antepartum   AMA (advanced maternal age) multigravida 35+, first trimester   Marginal insertion of umbilical cord affecting management of mother   Gestational diabetes mellitus (GDM)   Vaginal bleeding in pregnancy, third trimester   Influenza A virus present   COVID-19 affecting pregnancy, antepartum  Additional problems: Acute blood loss anemia; received IV Venofer 500 mg     Discharge diagnosis: Term Pregnancy Delivered                                              Post partum procedures: None Augmentation: N/A Complications: None  Hospital course: Sceduled C/S -  36y.o. yo [id number] admitted to the hospital 04/01/2021 for scheduled cesarean section with the following indication: Elective repeat, vaginal bleeding and contractions. Delivery details are as follows:  Membrane Rupture Time/Date: 5:11 AM ,04/02/2021   Delivery Method:C-Section, Low Transverse  Details of operation can be found in separate operative note.  Patient had an uncomplicated postpartum course.  She received IV Venofer for acute blood loss anemia post-operatively, from which she remained asymptomatic. Her fasting glucose on POD#2 was 88. She is ambulating, tolerating a regular diet, passing flatus, and urinating well. Patient is discharged home  in stable condition on  04/04/21        Newborn Data: Birth date:04/02/2021  Birth time:5:12 AM  Gender:Female  Living status:Living  Apgars:8 ,9  Weight:3095 g     Magnesium Sulfate received: No BMZ received: No Rhophylac: N/A MMR: N/A - Immune  T-DaP: Declined Flu: No Transfusion: No   Physical exam  Vitals:   04/03/21 0500 04/03/21 1300 04/03/21 2013 04/04/21 0611  BP: 115/61 (!) 102/54 113/78 120/63  Pulse: (!) 58 61 76 60  Resp: _0 Temp: 98.1 F (36.7 C) 97.8 F (36.6 C) 97.6 F (36.4 C) 98 F (36.7 C)  TempSrc: Oral Oral Oral Oral  SpO2:      Weight:      Height:       General: alert, cooperative, and no distress Lochia: appropriate Uterine Fundus: firm and below the umbilicus  Incision: Healing well with no significant drainage, No significant erythema, Dressing is clean, dry, and intact DVT Evaluation: no LE edema or calf tenderness to palpation   Labs: Lab Results  Component Value Date   WBC 9.8 04/03/2021   HGB 9.3 (L) 04/03/2021   HCT 28.0 (L) 04/03/2021   MCV 88.1 04/03/2021   PLT 176 04/03/2021   CMP Latest Ref Rng & Units 04/02/2021  Glucose 70 - 99 mg/dL -  BUN 6 - 20 mg/dL -  Creatinine 0.44 - 1.00 mg/dL 0.48  Sodium 135 - 145 mmol/L -  Potassium 3.5 - 5.1 mmol/L -  Chloride 98 - 111 mmol/L -  CO2 22 - 32 mmol/L -  Calcium 8.9 - 10.3 mg/dL -  Total Protein 6.5 - 8.1 g/dL -  Total Bilirubin 0.3 - 1.2 mg/dL -  Alkaline Phos 38 - 126 U/L -  AST 15 - 41 U/L -  ALT 0 - 44 U/L -   Edinburgh Score: Edinburgh Postnatal Depression Scale Screening Tool 02/03/2017  I have been able to laugh and see the funny side of things. 0  I have looked forward with enjoyment to things. 0  I have blamed myself unnecessarily when things went wrong. 0  I have been anxious or worried for no good reason. 0  I have felt scared or panicky for no good reason. 0  Things have been getting on top of me. 0  I have been so unhappy that I have had difficulty  sleeping. 0  I have felt sad or miserable. 0  I have been so unhappy that I have been crying. 0  The thought of harming myself has occurred to me. 0  Edinburgh Postnatal Depression Scale Total 0     After visit meds:  Allergies as of 04/04/2021   No Known Allergies      Medication List     STOP taking these medications    Accu-Chek Guide test strip Generic drug: glucose blood   Accu-Chek Softclix Lancets lancets   Blood Pressure Monitoring Devi   docusate sodium 100 MG capsule Commonly known as: COLACE   omeprazole 40 MG capsule Commonly known as: PRILOSEC       TAKE these medications    acetaminophen 500 MG tablet Commonly known as: TYLENOL Take 2 tablets (1,000 mg total) by mouth every 8 (eight) hours as needed (pain).   ibuprofen 600 MG tablet Commonly known as: ADVIL Take 1 tablet (600 mg total) by mouth every 6 (six) hours as needed (pain).   oxyCODONE 5 MG immediate release tablet Commonly known as: Oxy IR/ROXICODONE Take 1 tablet (5 mg total) by mouth every 6 (six) hours as needed for severe pain or breakthrough pain.   PrePLUS 27-1 MG Tabs Take 1 tablet by mouth daily.               Durable Medical Equipment  (From admission, onward)           Start     Ordered   04/02/21 1351  For home use only DME double electric breast pump  Once       Comments: Z39.1   04/02/21 1353              Discharge Care Instructions  (From admission, onward)           Start     Ordered   04/04/21 0000  Discharge wound care:       Comments: Remove dressing 5 days after your surgery date. You can then wash gently with soap and water and pat dry. You will have an incision check in about 1 week in the office.   04/04/21 0855             Discharge home in stable condition Infant Feeding: Bottle and Breast Infant Disposition: home with mother Discharge instruction: per After Visit Summary and Postpartum booklet. Activity: Advance as  tolerated. Pelvic rest for 6 weeks.  Diet: routine diet Future Appointments: Future Appointments  Date Time Provider Golinda  04/13/2021 10:00 AM [facility] NURSE [facility]  05/13/2021  8:20 AM [facility]  05/13/2021  9:35 AM  Radene Gunning, MD [facility]   Follow up Visit: Message sent to [facility] by Dr. Gwenlyn Perking on 04/02/21.  Please schedule this patient for a In person postpartum visit in 6 weeks with the following provider: MD (female provider only). Additional Postpartum F/U: 2 hour GTT and Incision check 1 week  High risk pregnancy complicated by: GDM and Hx of CS Delivery mode:  C-Section, Low Transverse  Anticipated Birth Control:  Unsure, would like to discuss further at postpartum visit   04/04/2021 Genia Del, MD

## 2021-04-02 NOTE — Op Note (Signed)
Sherryl [id number] PROCEDURE DATE: 04/02/2021  PREOPERATIVE DIAGNOSES: Intrauterine pregnancy at [redacted]w[redacted]d weeks gestation;  frequent and painful uterine contractions; vaginal bleeding; previous cesarean section x 2; gestational diabetes  POSTOPERATIVE DIAGNOSES: The same  PROCEDURE: Repeat Low Transverse Cesarean Section  SURGEON:  Dr. Jaynie Collins  ASSISTANT:  Dr. Evalina Field  ANESTHESIOLOGY TEAM: Anesthesiologist: Lucretia Kern, MD CRNA: Armanda Heritage, CRNA  INDICATIONS: Kelsey Ramirez is a 36 y.o. [id number] at [redacted]w[redacted]d here for cesarean section secondary to the indications listed under preoperative diagnoses; please see preoperative note for further details.  The risks of surgery were discussed with the patient including but were not limited to: bleeding which may require transfusion or reoperation; infection which may require antibiotics; injury to bowel, bladder, ureters or other surrounding organs; injury to the fetus; need for additional procedures including hysterectomy in the event of a life-threatening hemorrhage; formation of adhesions; placental abnormalities wth subsequent pregnancies; incisional problems; thromboembolic phenomenon and other postoperative/anesthesia complications.  The patient concurred with the proposed plan, giving informed written consent for the procedure.    FINDINGS:  Viable female infant in cephalic presentation.  Apgars 8 and 9.  Clear amniotic fluid.  Intact placenta, three vessel cord.  Normal uterus, fallopian tubes and ovaries bilaterally.  Very thin lower uterine segment noted prior to hysterotomy, but no window or defect noted. Minimal intraperitoneal adhesive disease involving vesicouterine peritoneum being higher up on the lower uterine segment noted during surgery.  Of note, patient was found to be positive but asymptomatic for COVID and Influenza A during admission, proper PPE was donned by staff in the OR.  ANESTHESIA: Spinal INTRAVENOUS  FLUIDS: 2000 ml   ESTIMATED BLOOD LOSS: 300 ml URINE OUTPUT:  150 ml SPECIMENS: Placenta sent to L&D COMPLICATIONS: None immediate  PROCEDURE IN DETAIL:  The patient preoperatively received intravenous antibiotics and had sequential compression devices applied to her lower extremities.  She was then taken to the operating room where spinal anesthesia was administered She was then placed in a dorsal supine position with a leftward tilt, and prepped and draped in a sterile manner.  A foley catheter was placed into her bladder and attached to constant gravity.  After an adequate timeout was performed, a Pfannenstiel skin incision was made with scalpel on her preexisting scar and carried through to the underlying layer of fascia. The fascia was incised in the midline, and this incision was extended bilaterally bluntly.  The underlying rectus muscles were dissected off the fascia superiorly and inferiorly in a blunt fashion. The rectus muscles were separated in the midline and the peritoneum was entered bluntly. The Alexis self-retaining retractor was introduced into the abdominal cavity.  Attention was turned to the lower uterine segment where a bladder flap was created bluntly and a low transverse hysterotomy was made with a scalpel and extended bilaterally bluntly.  The infant was successfully delivered, the cord was clamped and cut after one minute, and the infant was handed over to the awaiting neonatology team. Uterine massage was then administered, and the placenta delivered intact with a three-vessel cord. The uterus was then cleared of clots and debris.  The hysterotomy was closed with 0 Vicryl in a running locked fashion, and an imbricating layer was also placed with 0 Vicryl.  Figure-of-eight 0 Vicryl serosal stitches were placed to help with hemostasis.  The pelvis was cleared of all clot and debris. Hemostasis was confirmed on all surfaces.  The retractor was removed.  The peritoneum was closed with a  0 Vicryl running stitch.  The fascia was then closed using 0 PDS in a running fashion.  The subcutaneous layer was irrigated, and the skin was closed with a 4-0 Vicryl subcuticular stitch. The patient tolerated the procedure well. Sponge, instrument and needle counts were correct x 3.  She was taken to the recovery room in stable condition.     Jaynie Collins, MD, FACOG Obstetrician & Gynecologist, [facility] for [employer], [facility]

## 2021-04-02 NOTE — Interval H&P Note (Signed)
History and Physical Interval Note 04/02/2021 4:02 AM  Patient reported significantly more painful contractions that are now every 4-6 minutes and more pelvic pressure. No bleeding seen on pad but she is very uncomfortable.  Reassuring and reactive fetal heart rate tracing noted.  Patient adamantly refuses to undergo cervical check.  However, given increased frequency of her contractions now, her increased pain and pressure, will proceed with cesarean section now. It is now eight hours since the time she last ate which was 2000 last night.  OR and Anesthesiology teams notified of need to proceed with surgery.  To OR when ready.   Kelsey Collins, MD, FACOG Obstetrician & Gynecologist, [facility] for [employer], [facility]

## 2021-04-02 NOTE — Telephone Encounter (Signed)
Called pt with [facility] # Ninfa Linden [phone] and I explained to the pt that we request 7-10 business days to complete the paperwork.  Pt states that it is for her husband.  I advised that it is the same significant others as well.  Pt verbalized understanding with no further questions.   Leonette Nutting  04/02/21

## 2021-04-02 NOTE — Anesthesia Procedure Notes (Signed)
Spinal ° °Patient location during procedure: OR °Reason for block: surgical anesthesia °Staffing °Performed: anesthesiologist  °Anesthesiologist: Azarian Starace E, MD °Preanesthetic Checklist °Completed: patient identified, IV checked, risks and benefits discussed, surgical consent, monitors and equipment checked, pre-op evaluation and timeout performed °Spinal Block °Patient position: sitting °Prep: DuraPrep and site prepped and draped °Patient monitoring: continuous pulse ox, blood pressure and heart rate °Approach: midline °Location: L3-4 °Injection technique: single-shot °Needle °Needle type: Pencan  °Needle gauge: 24 G °Needle length: 10 cm °Assessment °Events: CSF return °Additional Notes °Functioning IV was confirmed and monitors were applied. Sterile prep and drape, including hand hygiene and sterile gloves were used. The patient was positioned and the spine was prepped. The skin was anesthetized with lidocaine.  Free flow of clear CSF was obtained prior to injecting local anesthetic into the CSF. The needle was carefully withdrawn. The patient tolerated the procedure well.  ° ° ° °

## 2021-04-02 NOTE — Lactation Note (Signed)
This note was copied from a baby's chart. Lactation Consultation Note  Patient Name: Kelsey Ramirez [id number] Date: 04/02/2021   Age:37 hours  LC visit attempted, but RN in room.    Lurline Hare [facility] 04/02/2021, 11:50 AM

## 2021-04-02 NOTE — Progress Notes (Signed)
Interim Progress Note   Called by RN that patient reported blurred vision without any associated HA and elevated blood pressure. Evaluated patient at bedside using an arabic interpreter via ipad.   Patient reports she has been having blurred vision with near objects and clear with far since June 2022 and has not worsened since onset. She does not wear contacts or glasses and has not been seen by an eye provider in several years. She was hoping it would resolve when she delivered.   She otherwise reports she is doing well s/p rLTCS earlier this morning. Currently breastfeeding without concern.   Discussed while it is possible to have pregnancy related vision changes, would not usually expect far sightedness. Recommend she follow up with optometry/ophthalmology after discharge.    Allayne Stack, DO

## 2021-04-02 NOTE — Progress Notes (Addendum)
Post Operative Day 1  Subjective: Doing well. No acute events overnight. Pain is controlled and bleeding is appropriate. She is eating, drinking, voiding, and ambulating without issue. She is formula feeding right now since her milk has not come in yet. She has no other concerns at this time.  Objective: Blood pressure 115/61, pulse (!) 58, temperature 98.1 F (36.7 C), temperature source Oral, resp. rate 16, height 5' (1.524 m), weight 57.6 kg, last menstrual period 07/05/2020, SpO2 100 %, unknown if currently breastfeeding.  Physical Exam:  General: alert, cooperative, and no distress Lochia: appropriate Uterine Fundus: firm and below umbilicus  Incision: healing well, no significant drainage, no significant erythema, dressing is clean, dry, and intact  DVT Evaluation: no LE edema or calf tenderness to palpation   Recent Labs    04/02/21 0958 04/03/21 0449  HGB 12.0 9.3*  HCT 36.2 28.0*    Assessment/Plan: Kelsey Ramirez is a 36 y.o. [id number] on POD#1 s/p rLTCS.  Progressing well. Meeting postpartum milestones. VSS. Continue routine postpartum care.  #Acute blood loss anemia: - Hgb 12 > 9.3 post-operatively - Asymptomatic  - Will give IV Venofer 500 mg today  #GDM: - Ate last around 0230 per RN - Plan to obtain fasting CBG tomorrow on 1/28  #COVD positive: #Flu positive: - Continues to remain asymptomatic  - Will continue to monitor   Feeding: Formula for now, working on breastfeeding  Contraception: Declines currently; would like to discuss further at her postpartum visit   Dispo: Plan for discharge on POD#2-3.   Arabic interpreter via iPad Stratus interpreter used for entirety of encounter.   LOS: 1 day   Genia Del, MD  04/03/2021, 6:28 AM

## 2021-04-02 NOTE — Anesthesia Postprocedure Evaluation (Signed)
Anesthesia Post Note  Patient: Kelsey Ramirez  Procedure(s) Performed: CESAREAN SECTION     Patient location during evaluation: PACU Anesthesia Type: Epidural Level of consciousness: oriented and awake and alert Pain management: pain level controlled Vital Signs Assessment: post-procedure vital signs reviewed and stable Respiratory status: spontaneous breathing, respiratory function stable and nonlabored ventilation Cardiovascular status: blood pressure returned to baseline and stable Postop Assessment: no headache, no backache, no apparent nausea or vomiting and spinal receding Anesthetic complications: no   No notable events documented.  Last Vitals:  Vitals:   04/02/21 1123 04/02/21 1424  BP: 116/66   Pulse: 62   Resp: 16 16  Temp: 36.8 C 36.7 C  SpO2: 98% 99%    Last Pain:  Vitals:   04/02/21 1424  TempSrc: Oral  PainSc: 0-No pain   Pain Goal: Patients Stated Pain Goal: 3 (04/02/21 0740)                 Lucretia Kern

## 2021-04-02 NOTE — Anesthesia Preprocedure Evaluation (Signed)
Anesthesia Evaluation  Patient identified by MRN, date of birth, ID band Patient awake    Reviewed: Allergy & Precautions, H&P , NPO status , Patient's Chart, lab work & pertinent test results  History of Anesthesia Complications Negative for: history of anesthetic complications  Airway Mallampati: II  TM Distance: >3 FB     Dental   Pulmonary Recent URI  (COVID and Flu A + on 04/01/21),    Pulmonary exam normal        Cardiovascular negative cardio ROS   Rhythm:regular Rate:Normal     Neuro/Psych negative neurological ROS  negative psych ROS   GI/Hepatic negative GI ROS, Neg liver ROS,   Endo/Other  diabetes, Gestational  Renal/GU negative Renal ROS  negative genitourinary   Musculoskeletal   Abdominal   Peds  Hematology negative hematology ROS (+)        Component                Value               Date/Time                 HGB                      12.1                04/01/2021 1329           HGB                      11.5                03/19/2021 1139           HCT                      36.9                04/01/2021 1329           HCT                      33.1 (L)            03/19/2021 1139           PLT                      255                 04/01/2021 1329           PLT                      291                 03/19/2021 1139        Anesthesia Other Findings   Reproductive/Obstetrics (+) Pregnancy [id number] at [redacted]w[redacted]d Prior C/S x2                             Anesthesia Physical Anesthesia Plan  ASA: 2 and emergent  Anesthesia Plan: Epidural   Post-op Pain Management:    Induction:   PONV Risk Score and Plan: Ondansetron and Treatment may vary due to age or medical condition  Airway Management Planned:   Additional Equipment:   Intra-op Plan:   Post-operative Plan:   Informed Consent: I have reviewed the  patients History and Physical, chart, labs and discussed the  procedure including the risks, benefits and alternatives for the proposed anesthesia with the patient or authorized representative who has indicated his/her understanding and acceptance.       Plan Discussed with: Anesthesiologist  Anesthesia Plan Comments:         Anesthesia Quick Evaluation

## 2021-04-03 LAB — CBC
HCT: 28 % — ABNORMAL LOW (ref 36.0–46.0)
Hemoglobin: 9.3 g/dL — ABNORMAL LOW (ref 12.0–15.0)
MCH: 29.2 pg (ref 26.0–34.0)
MCHC: 33.2 g/dL (ref 30.0–36.0)
MCV: 88.1 fL (ref 80.0–100.0)
Platelets: 176 10*3/uL (ref 150–400)
RBC: 3.18 MIL/uL — ABNORMAL LOW (ref 3.87–5.11)
RDW: 16.5 % — ABNORMAL HIGH (ref 11.5–15.5)
WBC: 9.8 10*3/uL (ref 4.0–10.5)
nRBC: 0 % (ref 0.0–0.2)

## 2021-04-03 MED ORDER — SODIUM CHLORIDE 0.9 % IV SOLN
500.0000 mg | Freq: Once | INTRAVENOUS | Status: AC
  Administered 2021-04-03: 500 mg via INTRAVENOUS
  Filled 2021-04-03: qty 25

## 2021-04-03 NOTE — Lactation Note (Signed)
This note was copied from a baby's chart. Lactation Consultation Note  Patient Name: Kelsey Ramirez [id number] Date: 04/03/2021 Reason for consult: Follow-up assessment Age:37 hours  Video interpreter used for Arabic. Baby is primarily being formula fed. Mother states she has no milk.  Provided education regarding supply and demand. Encouraged breastfeeding before formula to help establish her milk supply. Feed on demand with cues.  Goal 8-12+ times per day after first 24 hrs.  Place baby STS if not cueing.  Suggest calling if help is needed.    Feeding Mother's Current Feeding Choice: Breast Milk and Formula  Interventions Interventions: Education;Breast feeding basics reviewed  Consult Status Consult Status: PRN    Hardie Pulley 04/03/2021, 2:34 PM

## 2021-04-03 NOTE — Social Work (Signed)
CSW received consult requesting car seat assistance. CSW met with MOB to offer support and complete assessment.    #[id number]  CSW met with MOB at bedside and introduced CSW role. MOB presented calm and welcomed the visit. MOB reported she does not have a car seat for the baby and does not have a place for the infant to sleep. MOB reproted she does not have funds to pay for a car seat or crib. MOB reported she has diaper, wipes and clothes for the infant. MOB accepted the hospital assistance with the car seat and pack n play.   MOB completed waivers. CSW delivered pack n play,sheets and a car seat.   CSW provided review of Sudden Infant Death Syndrome (SIDS) precautions. MOB reported understanding.   CSW identifies no further need for intervention and no barriers to discharge at this time.   Kathrin Greathouse, MSW, LCSW Women's and [place] Worker  [phone] 04/03/2021  4:17 PM

## 2021-04-04 ENCOUNTER — Encounter: Payer: Self-pay | Admitting: Radiology

## 2021-04-04 DIAGNOSIS — O43199 Other malformation of placenta, unspecified trimester: Secondary | ICD-10-CM

## 2021-04-04 DIAGNOSIS — O0990 Supervision of high risk pregnancy, unspecified, unspecified trimester: Secondary | ICD-10-CM

## 2021-04-04 DIAGNOSIS — O09299 Supervision of pregnancy with other poor reproductive or obstetric history, unspecified trimester: Secondary | ICD-10-CM

## 2021-04-04 DIAGNOSIS — D509 Iron deficiency anemia, unspecified: Secondary | ICD-10-CM

## 2021-04-04 DIAGNOSIS — O09521 Supervision of elderly multigravida, first trimester: Secondary | ICD-10-CM

## 2021-04-04 LAB — GLUCOSE, CAPILLARY: Glucose-Capillary: 88 mg/dL (ref 70–99)

## 2021-04-04 MED ORDER — ACETAMINOPHEN 500 MG PO TABS: 1000.0000 mg | ORAL_TABLET | Freq: Three times a day (TID) | ORAL | 0 refills | Status: DC | PRN

## 2021-04-04 MED ORDER — IBUPROFEN 600 MG PO TABS: 600.0000 mg | ORAL_TABLET | Freq: Four times a day (QID) | ORAL | 0 refills | Status: DC | PRN

## 2021-04-04 MED ORDER — OXYCODONE HCL 5 MG PO TABS: 5.0000 mg | ORAL_TABLET | Freq: Four times a day (QID) | ORAL | 0 refills | Status: DC | PRN

## 2021-04-04 NOTE — Lactation Note (Addendum)
This note was copied from a baby's chart. Lactation Consultation Note  Patient Name: Kelsey Ramirez [id number] Date: 04/04/2021 Reason for consult: Follow-up assessment;Mother's request;Term;Breastfeeding assistance;Other (Comment);Maternal endocrine disorder (Anemia) Age:37 hours  Mervat Arabic translator [phone]  LC attempted latch prior to discharge infant had a recent feeding. We did work on different latch positions to get more depth on the breast.   Mom nipple soreness no abrasions noted. Comfort gels provided for nipple care, rinse in between use and d/c after 6 days.   Infant adequate urine and stool output prior to d/c  Plan 1 to feed based on cues 8-12x 24hr period. Mom to offer breasts and look for signs of milk transfer.  2. Mom to supplement with EBM first followed by formula with pace bottle feeding and slow flow nipple. Mom to offer more if infant not latching at breast. BF supplementation guide reviewed.  3. Manual pump q 3hrs for 10 min each breast.   Mom given WIC number to call to get formula on Monday and follow up with [facility] for assistance with latching and monitoring progress with pumping and milk let down. All questions answered at the end of the visit.   Maternal Data    Feeding Mother's Current Feeding Choice: Breast Milk and Formula  LATCH Score                    Lactation Tools Discussed/Used Tools: Comfort gels;Pump;Flanges Breast pump type: Manual Pump Education: Setup, frequency, and cleaning;Milk Storage Reason for Pumping: increase stimulation Pumping frequency: every 3 hrs for  10 min each breast (Mom mostly offering formula. LC informed mother importance of latching first, followed by supplementation with EBM first before formula and post pumping with hand pump to maintain milk supply. Mom doing neither pumping or latching. Mom has manual pump)  Interventions Interventions: Breast feeding basics reviewed;Assisted with latch;Skin to  skin;Breast massage;Hand express;Breast compression;Adjust position;Position options;Expressed milk;Comfort gels;Hand pump;Education;Pace feeding;LC Psychologist, educational;Infant Driven Feeding Algorithm education  Discharge Discharge Education: Engorgement and breast care;Warning signs for feeding baby;Outpatient recommendation;Outpatient Epic message sent Pump: Manual WIC Program: No  Consult Status Consult Status: Complete Date: 04/04/21    Kelsey Ramirez  Kelsey Ramirez 04/04/2021, 1:29 PM

## 2021-04-06 ENCOUNTER — Encounter: Payer: Self-pay | Admitting: Obstetrics and Gynecology

## 2021-04-06 ENCOUNTER — Encounter: Payer: Self-pay | Admitting: Student

## 2021-04-06 ENCOUNTER — Ambulatory Visit (INDEPENDENT_AMBULATORY_CARE_PROVIDER_SITE_OTHER): Payer: Medicaid Other | Admitting: Obstetrics and Gynecology

## 2021-04-06 ENCOUNTER — Other Ambulatory Visit: Payer: Self-pay

## 2021-04-06 VITALS — BP 129/72 | HR 69 | Wt 121.2 lb

## 2021-04-06 DIAGNOSIS — Z98891 History of uterine scar from previous surgery: Secondary | ICD-10-CM

## 2021-04-06 DIAGNOSIS — R600 Localized edema: Secondary | ICD-10-CM | POA: Insufficient documentation

## 2021-04-06 NOTE — Progress Notes (Signed)
Obstetrics and Gynecology Visit Return Patient Evaluation  Appointment Date: 04/06/2021  Primary Care Provider: Janeece Agee  OBGYN Clinic: [facility] for [facility]  Chief Complaint: add on visit  History of Present Illness:  Kelsey Ramirez is a 36 y.o. here for b/l LE edema. Patient states she saw her pediatrician today and they recommended she be evaluated asap today so she came to the front desk with this.   She is s/p a 1/26 rLTCS at 38/5 due to presenting in labor and she was discharged to home on pod#2.   Patient also states that ever since the beginning of pregnancy she has a hard time focusing when she looks at things up close like for reading, her phone.   Review of Systems: as noted in the History of Present Illness.  Patient Active Problem List   Diagnosis Date Noted   Bilateral lower extremity edema 04/06/2021   Cesarean delivery delivered 04/02/2021   S/P cesarean section 04/02/2021   Influenza A virus present 04/01/2021   COVID-19 affecting pregnancy, antepartum 04/01/2021   Nasal polyp 03/30/2021   Maternal iron deficiency anemia complicating pregnancy in third trimester 02/17/2021   Cervical polyp 02/14/2021   Vaginal bleeding in pregnancy, third trimester 02/14/2021   Gestational diabetes mellitus (GDM) 01/23/2021   Marginal insertion of umbilical cord affecting management of mother 11/21/2020   AMA (advanced maternal age) multigravida 35+, first trimester 10/10/2020   Complication of anesthesia 08/22/2020   Supervision of high risk pregnancy, antepartum 08/22/2020   History of miscarriage, currently pregnant 04/30/2018   Language barrier affecting health care 06/23/2016   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 Current Outpatient Medications  Medication Sig Dispense Refill   folic acid (FOLVITE) 1 MG tablet  Take 1 mg by mouth daily.     ibuprofen (ADVIL) 600 MG tablet Take 1 tablet (600 mg total) by mouth every 6 (six) hours as needed (pain). 40 tablet 0   Prenatal Vit-Fe Fumarate-FA (PREPLUS) 27-1 MG TABS Take 1 tablet by mouth daily. 30 tablet 13   acetaminophen (TYLENOL) 500 MG tablet Take 2 tablets (1,000 mg total) by mouth every 8 (eight) hours as needed (pain). (Patient not taking: Reported on 04/06/2021) 60 tablet 0   oxyCODONE (OXY IR/ROXICODONE) 5 MG immediate release tablet Take 1 tablet (5 mg total) by mouth every 6 (six) hours as needed for severe pain or breakthrough pain. (Patient not taking: Reported on 04/06/2021) 12 tablet 0   No current facility-administered medications for this visit.    Allergies: has No Known Allergies.  Physical Exam:  BP 129/72    Pulse 69    Wt 121 lb 3.2 oz (55 kg)    LMP 07/05/2020 (Exact Date)    BMI 23.67 kg/m  Body mass index is 23.67 kg/m. General appearance: Well nourished, well developed female in no acute distress.  Abdomen: soft, nttp, nd. Honeycomb removed and incision looks great. No infection, e/o separation or henria LE: b/l LE 2 to 3+ edema to a 3-4cm above the knee, symmetric, nttp Neuro/Psych:  Normal mood and affect.     Assessment: pt doing well  Plan:  1. Bilateral lower extremity edema Pt is breast and formula. I offered her exp management or short course of diuretics and she elected for exp management. I also told her I recommend elevating when able and to do thigh high compresson stockings as much as  possible. Will have pt keep her 2/6 incision check to make sure her BP is okay and f/u her LE edema  2. S/P cesarean section Healing well I told her it's very common to have change in vision with pregnancy but should resolve by around 12wks PP  Interpreter used  RTC: 2/6 for f/u visit.   Cornelia Copa MD Attending Center for [employer] [facility])

## 2021-04-06 NOTE — Patient Instructions (Signed)
Thigh high compression stockings

## 2021-04-06 NOTE — Progress Notes (Signed)
Patient reports blurry vision while reading phone screen.

## 2021-04-13 ENCOUNTER — Other Ambulatory Visit: Payer: Self-pay

## 2021-04-13 ENCOUNTER — Ambulatory Visit (INDEPENDENT_AMBULATORY_CARE_PROVIDER_SITE_OTHER): Payer: Medicaid Other | Admitting: *Deleted

## 2021-04-13 VITALS — BP 116/79 | HR 89 | Ht 60.0 in | Wt 112.7 lb

## 2021-04-13 DIAGNOSIS — Z5189 Encounter for other specified aftercare: Secondary | ICD-10-CM

## 2021-04-13 NOTE — Progress Notes (Signed)
Patient was evaluated by nursing staff. Agree with assessment and plan.  °

## 2021-04-13 NOTE — Progress Notes (Signed)
Here for wound check and bp check s/p c/s 04/02/21. Husband and children with patient. She had a visit 04/06/21 for edema. Today states edema all gone. Nurse verified no edema. BP wnl 116/79. Incision CDI without redness, edema. Reviewed wound care with patient. Reviewed postpartum appointment with patient. Kree Rafter,RN

## 2021-05-07 ENCOUNTER — Other Ambulatory Visit: Payer: Self-pay | Admitting: General Practice

## 2021-05-07 DIAGNOSIS — Z8632 Personal history of gestational diabetes: Secondary | ICD-10-CM

## 2021-05-13 ENCOUNTER — Encounter: Payer: Self-pay | Admitting: Obstetrics and Gynecology

## 2021-05-13 ENCOUNTER — Telehealth: Payer: Self-pay | Admitting: Family Medicine

## 2021-05-13 ENCOUNTER — Other Ambulatory Visit: Payer: Self-pay

## 2021-05-13 ENCOUNTER — Other Ambulatory Visit: Payer: Medicaid Other

## 2021-05-13 ENCOUNTER — Ambulatory Visit (INDEPENDENT_AMBULATORY_CARE_PROVIDER_SITE_OTHER): Payer: 59 | Admitting: Obstetrics and Gynecology

## 2021-05-13 VITALS — BP 106/80 | HR 97 | Wt 106.8 lb

## 2021-05-13 DIAGNOSIS — Z789 Other specified health status: Secondary | ICD-10-CM | POA: Diagnosis not present

## 2021-05-13 DIAGNOSIS — H5713 Ocular pain, bilateral: Secondary | ICD-10-CM

## 2021-05-13 DIAGNOSIS — J330 Polyp of nasal cavity: Secondary | ICD-10-CM

## 2021-05-13 DIAGNOSIS — Z758 Other problems related to medical facilities and other health care: Secondary | ICD-10-CM

## 2021-05-13 NOTE — Progress Notes (Signed)
? ? ?Post Partum Visit Note ? ?Kelsey Ramirez is a 36 y.o. [phone] female who presents for a postpartum visit. She is 5 weeks and 6 days postpartum following a repeat cesarean section.  I have fully reviewed the prenatal and intrapartum course. The delivery was at [redacted]w[redacted]d gestational weeks.  Anesthesia: spinal. Postpartum course has been going well per patient. Baby is doing well. Baby is feeding by both breast and bottle - Similac Advance. Bleeding staining only. Bowel function is normal. Bladder function is normal. Patient is not sexually active. Contraception method is none but patient states she will use condoms. Postpartum depression screening: negative. ? ? ?The pregnancy intention screening data noted above was reviewed. Potential methods of contraception were discussed. The patient elected to proceed with condoms. ? ? Edinburgh Postnatal Depression Scale - 05/13/21 0939   ? ?  ? Edinburgh Postnatal Depression Scale:  In the Past 7 Days  ? I have been able to laugh and see the funny side of things. 0   ? I have looked forward with enjoyment to things. 0   ? I have blamed myself unnecessarily when things went wrong. 0   ? I have been anxious or worried for no good reason. 0   ? I have felt scared or panicky for no good reason. 0   ? Things have been getting on top of me. 0   ? I have been so unhappy that I have had difficulty sleeping. 0   ? I have felt sad or miserable. 0   ? I have been so unhappy that I have been crying. 0   ? The thought of harming myself has occurred to me. 0   ? Edinburgh Postnatal Depression Scale Total 0   ? ?  ?  ? ?  ? ? ?Health Maintenance Due  ?Topic Date Due  ? URINE MICROALBUMIN  Never done  ? PAP SMEAR-Modifier  Never done  ? ? ?The following portions of the patient's history were reviewed and updated as appropriate: allergies, current medications, past family history, past medical history, past social history, past surgical history, and problem list. ? ?Review of  Systems ?Pertinent items are noted in HPI. ? ?Objective:  ?BP 106/80   Pulse 97   Wt 106 lb 12.8 oz (48.4 kg)   Breastfeeding Yes   BMI 20.86 kg/m?   ? ?General:  alert, cooperative, and no distress  ? Breasts:  not indicated  ?Lungs: clear to auscultation bilaterally  ?Heart:  regular rate and rhythm  ?Abdomen: soft, non-tender; bowel sounds normal; no masses,  no organomegaly   ?Wound well approximated incision  ?GU exam:  not indicated  ?     ?Assessment:  ? ? 1. Language barrier affecting health care ?- Interpreter used throughout her appointment ? ? ?6 postpartum exam.  ? ?Plan:  ? ?Essential components of care per ACOG recommendations: ? ?1.  Mood and well being: Patient with negative depression screening today. Reviewed local resources for support.  ?- Patient tobacco use? No.   ?- hx of drug use? No.   ? ?2. Infant care and feeding:  ?-Patient currently breastmilk feeding? Yes. Reviewed importance of draining breast regularly to support lactation.  ?-Social determinants of health (SDOH) reviewed in EPIC. No concerns ? ?3. Sexuality, contraception and birth spacing ?- Patient does not want a pregnancy in the next year.  Desired family size is undecided children.  ?- Reviewed reproductive life planning. Reviewed contraceptive methods based on pt preferences  and effectiveness.  Patient desired condoms today.   ?- Discussed birth spacing of 18 months ? ?4. Sleep and fatigue ?-Encouraged family/partner/community support of 4 hrs of uninterrupted sleep to help with mood and fatigue ? ?5. Physical Recovery  ?- Discussed patients delivery and complications. She describes her labor as good. ?- Patient had a C-section.  ?- Patient has urinary incontinence? No. ?- Patient is safe to resume physical and sexual activity ? ?6.  Health Maintenance ?- HM due items addressed Yes ?- Last pap smear No results found for: DIAGPAP Pap smear not done at today's visit. Pt declines pap smear. Discussed the importance of pap  smears for prevention of cervical cancer. She says she had pap in [place] in 2021. Recommended pap this year or next.  ?-Breast Cancer screening indicated? No.  ? ?7. Chronic Disease/Pregnancy Condition follow up: Gestational Diabetes - she declines 2 hr - she will do testing with PCP. Reviewed importance of f/u and lifelong risk of T2DM.  ? ?- PCP follow up for sinus issues. Also notes eye pain. We will try to call them to make an appointment for her. She also thinks she has a nasal polyp - she would like to f/u with ENT. Referral placed.  ? ?Milas Hock, MD ?Center for [facility], [facility]  ?

## 2021-05-13 NOTE — Patient Instructions (Signed)
Appointment with PCP: Maximiano Coss, NP ?05/15/21 at 9:30 AM ?[facility] ?[address] ?[place], [place] [address] ?

## 2021-05-13 NOTE — Telephone Encounter (Signed)
Patient refused pap smear and 2hr GTT ?

## 2021-05-15 ENCOUNTER — Encounter: Payer: Self-pay | Admitting: Registered Nurse

## 2021-05-15 ENCOUNTER — Ambulatory Visit (INDEPENDENT_AMBULATORY_CARE_PROVIDER_SITE_OTHER): Payer: 59 | Admitting: Registered Nurse

## 2021-05-15 ENCOUNTER — Ambulatory Visit: Payer: Medicaid Other | Admitting: Registered Nurse

## 2021-05-15 VITALS — BP 125/99 | HR 89 | Temp 98.0°F | Resp 18 | Ht 60.0 in | Wt 109.8 lb

## 2021-05-15 DIAGNOSIS — J3489 Other specified disorders of nose and nasal sinuses: Secondary | ICD-10-CM

## 2021-07-06 NOTE — Progress Notes (Signed)
? ?Acute Office Visit ? ?Subjective:  ? ? Patient ID: Kelsey Ramirez, female    DOB: [birth year], 36 y.o.   MRN: [id number] ? ?Chief Complaint  ?Patient presents with  ? Referral  ?  Patient states she needs a referral to ENT for ear pain.patient states she just had a baby in January and having some brown discharge  ? ? ?HPI ?Patient is in today for ENT referral ? ?Has been seen by ENT before, but not locally ?Ongoing sinus pressure and brownish drainage ?No other symptoms of infection, no sick contacts. ?Has been using OTC medications including decongestants and antihistamines with limited effect.  ? ?Otherwise no acute concerns today.  ? ?Outpatient Medications Prior to Visit  ?Medication Sig Dispense Refill  ? folic acid (FOLVITE) 1 MG tablet Take 1 mg by mouth daily.    ? Prenatal Vit-Fe Fumarate-FA (PREPLUS) 27-1 MG TABS Take 1 tablet by mouth daily. 30 tablet 13  ? ibuprofen (ADVIL) 600 MG tablet Take 1 tablet (600 mg total) by mouth every 6 (six) hours as needed (pain). (Patient not taking: Reported on 05/13/2021) 40 tablet 0  ? oxyCODONE (OXY IR/ROXICODONE) 5 MG immediate release tablet Take 1 tablet (5 mg total) by mouth every 6 (six) hours as needed for severe pain or breakthrough pain. (Patient not taking: Reported on 05/13/2021) 12 tablet 0  ? ?No facility-administered medications prior to visit.  ? ? ?Review of Systems  ?Constitutional: Negative.   ?HENT:  Positive for congestion, postnasal drip and sinus pressure.   ?Eyes: Negative.   ?Respiratory: Negative.    ?Cardiovascular: Negative.   ?Gastrointestinal: Negative.   ?Genitourinary: Negative.   ?Musculoskeletal: Negative.   ?Skin: Negative.   ?Neurological: Negative.   ?Psychiatric/Behavioral: Negative.    ?All other systems reviewed and are negative. ? ?   ?Objective:  ?  ?BP (!) 125/99   Pulse 89   Temp 98 ?F (36.7 ?C) (Temporal)   Resp 18   Ht 5' (1.524 m)   Wt 109 lb 12.8 oz (49.8 kg)   SpO2 100%   BMI 21.44 kg/m?  ?Physical Exam ?Vitals and  nursing note reviewed.  ?Constitutional:   ?   General: She is not in acute distress. ?   Appearance: Normal appearance. She is normal weight. She is not ill-appearing, toxic-appearing or diaphoretic.  ?HENT:  ?   Nose: Congestion and rhinorrhea present.  ?   Mouth/Throat:  ?   Mouth: Mucous membranes are moist.  ?   Pharynx: Oropharynx is clear. No oropharyngeal exudate or posterior oropharyngeal erythema.  ?Cardiovascular:  ?   Rate and Rhythm: Normal rate and regular rhythm.  ?   Heart sounds: Normal heart sounds. No murmur heard. ?  No friction rub. No gallop.  ?Pulmonary:  ?   Effort: Pulmonary effort is normal. No respiratory distress.  ?   Breath sounds: Normal breath sounds. No stridor. No wheezing, rhonchi or rales.  ?Chest:  ?   Chest wall: No tenderness.  ?Skin: ?   General: Skin is warm and dry.  ?Neurological:  ?   General: No focal deficit present.  ?   Mental Status: She is alert and oriented to person, place, and time. Mental status is at baseline.  ?Psychiatric:     ?   Mood and Affect: Mood normal.     ?   Behavior: Behavior normal.     ?   Thought Content: Thought content normal.     ?   Judgment: Judgment  normal.  ? ? ?No results found for any visits on 05/15/21. ? ? ?   ?Assessment & Plan:  ?1. Sinus pressure ?- Ambulatory referral to ENT ? ? ? ?No orders of the defined types were placed in this encounter. ? ? ?No follow-ups on file. ? ?PLAN ?Recommend saline sinus rinse ?Refer to ENT ?Continue OTC meds ?Patient encouraged to call clinic with any questions, comments, or concerns. ? ? ?Janeece Agee, NP ?

## 2021-12-24 ENCOUNTER — Other Ambulatory Visit: Payer: Self-pay | Admitting: Obstetrics and Gynecology

## 2021-12-24 DIAGNOSIS — O24419 Gestational diabetes mellitus in pregnancy, unspecified control: Secondary | ICD-10-CM

## 2021-12-24 DIAGNOSIS — O0990 Supervision of high risk pregnancy, unspecified, unspecified trimester: Secondary | ICD-10-CM

## 2022-03-26 ENCOUNTER — Encounter: Payer: Self-pay | Admitting: Advanced Practice Midwife

## 2022-03-26 ENCOUNTER — Other Ambulatory Visit: Payer: Self-pay

## 2022-03-26 ENCOUNTER — Ambulatory Visit (INDEPENDENT_AMBULATORY_CARE_PROVIDER_SITE_OTHER): Payer: BLUE CROSS/BLUE SHIELD | Admitting: Advanced Practice Midwife

## 2022-03-26 VITALS — BP 98/61 | HR 78 | Ht 60.0 in | Wt 122.0 lb

## 2022-03-26 DIAGNOSIS — N939 Abnormal uterine and vaginal bleeding, unspecified: Secondary | ICD-10-CM

## 2022-03-26 LAB — CBC
Hematocrit: 38.7 % (ref 34.0–46.6)
Hemoglobin: 12.2 g/dL (ref 11.1–15.9)
MCH: 26.5 pg — ABNORMAL LOW (ref 26.6–33.0)
MCHC: 31.5 g/dL (ref 31.5–35.7)
MCV: 84 fL (ref 79–97)
Platelets: 500 10*3/uL — ABNORMAL HIGH (ref 150–450)
RBC: 4.6 x10E6/uL (ref 3.77–5.28)
RDW: 13 % (ref 11.7–15.4)
WBC: 6.7 10*3/uL (ref 3.4–10.8)

## 2022-03-26 MED ORDER — NORETHIN ACE-ETH ESTRAD-FE 1-20 MG-MCG(24) PO TABS: 1.0000 | ORAL_TABLET | Freq: Every day | ORAL | 12 refills | Status: DC

## 2022-03-26 NOTE — Progress Notes (Signed)
GYNECOLOGY ANNUAL PREVENTATIVE CARE ENCOUNTER NOTE  History:     Kelsey Ramirez is a 37 y.o. [phone] female here for a routine abnormal uterine bleeding since menses returned after delivery 03/27/21. Has had about 5 menstrual cycles. Currently breastfeeding. Not using contraception. Bleeding last ~ 15 days, moderate>light. No intermenstrual bleeding. Denies dizziness, discharge, pelvic pain, problems with intercourse or other gynecologic concerns.  No Hx of AUB. Last IC > 2 weeks ago.     Gynecologic History Patient's last menstrual period was 03/16/2022 (exact date). Contraception: none Last Pap: 2021. Results were: normal per pt. Records not available. Unknown if HPV testing done.  Last mammogram: None. Results were: NA  Obstetric History OB History  Gravida Para Term Preterm AB Living  4 3 3   1 3   SAB IAB Ectopic Multiple Live Births  1     0 3    # Outcome Date GA Lbr Len/2nd Weight Sex Delivery Anes PTL Lv  4 Term 04/02/21 [redacted]w[redacted]d  6 lb 13.2 oz (3.095 kg) F CS-LTranv Spinal  LIV  3 Term 12/30/16 [redacted]w[redacted]d  6 lb 11.1 oz (3.035 kg) M CS-LTranv EPI  LIV     Birth Comments: wnl  2 Term 09/10/15 [redacted]w[redacted]d  5 lb 15.2 oz (2.7 kg) M CS-LTranv   LIV     Complications: Delivery by elective cesarean section  1 SAB             Obstetric Comments  G1: in [place]. Pt states was elective due to female circ.     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    Gestational diabetes    Menorrhagia 06/19/2020   Nasal polyp     Past Surgical History:  Procedure Laterality Date   CESAREAN SECTION     CESAREAN SECTION N/A 12/30/2016   Procedure: REPEAT CESAREAN SECTION;  Surgeon: Jonnie Kind, MD;  Location: [place];  Service: Obstetrics;  Laterality: N/A;   CESAREAN SECTION N/A 04/02/2021   Procedure: CESAREAN SECTION;  Surgeon: Osborne Oman, MD;  [facility];  Service: Obstetrics;  Laterality: N/A;     Current Outpatient Medications on File Prior to Visit  Medication Sig Dispense Refill   folic acid (FOLVITE) 1 MG tablet Take 1 mg by mouth daily.     Prenatal Vit-Fe Fumarate-FA (PREPLUS) 27-1 MG TABS Take 1 tablet by mouth daily. 30 tablet 13   VITAMIN D PO Take by mouth.     ibuprofen (ADVIL) 600 MG tablet Take 1 tablet (600 mg total) by mouth every 6 (six) hours as needed (pain). (Patient not taking: Reported on 05/13/2021) 40 tablet 0   oxyCODONE (OXY IR/ROXICODONE) 5 MG immediate release tablet Take 1 tablet (5 mg total) by mouth every 6 (six) hours as needed for severe pain or breakthrough pain. (Patient not taking: Reported on 05/13/2021) 12 tablet 0   No current facility-administered medications on file prior to visit.    No Known Allergies  Social History:  reports that she has never smoked. She has never used smokeless tobacco. She reports that she does not drink alcohol and does not use drugs.  Family History  Problem Relation Age of Onset   Healthy Mother    Healthy Father    Cancer Neg Hx    Diabetes Neg Hx    Hypertension Neg Hx     The following portions  of the patient's history were reviewed and updated as appropriate: allergies, current medications, past family history, past medical history, past social history, past surgical history and problem list.  Review of Systems Review of Systems  Constitutional:  Negative for chills, fever and unexpected weight change.  Gastrointestinal:  Negative for anal bleeding.  Genitourinary:  Positive for vaginal bleeding. Negative for pelvic pain and vaginal discharge.  Neurological:  Negative for dizziness and light-headedness.     Physical Exam:  BP 98/61   Pulse 78   Ht 5' (1.524 m)   Wt 122 lb (55.3 kg)   LMP 03/16/2022 (Exact Date) Comment: period goin on 11 days now; periods have been lasting 17-18 days; heavy for first half then light bleeding for second half  Breastfeeding No   BMI 23.83 kg/m  CONSTITUTIONAL:  Well-developed, well-nourished, slender female in no acute distress.  HENT:  Normocephalic, atraumatic.  EYES: Conjunctivae normal. No scleral icterus.  SKIN: Skin is warm and dry. No rash noted. Not diaphoretic. No erythema. No pallor. Nrml female hair distribution.  MUSCULOSKELETAL: Normal range of motion. No tenderness.  No cyanosis or edema.   NEUROLOGIC: Alert and oriented to person, place, and time. Normal muscle tone coordination.  PSYCHIATRIC: Normal mood and affect. Normal behavior. Normal judgment and thought content. CARDIOVASCULAR: Normal heart rate noted. RESPIRATORY: Effort and rate normal. BREASTS: Declined ABDOMEN: Soft, no distention, tenderness, rebound or guarding.  PELVIC: Declined. Pap smear not obtained.   Assessment and Plan:  1. Abnormal uterine bleeding - LoEstrin 24 Fe - US PELVIS (TRANSABDOMINAL ONLY); Future - CBC - Lengthy discussion about possible causes of New AUB. No clear etiology. No significant weight, health, contraceptive use or life changes. Will check [place] for fibroids, possible polyps. Discussed possible need for endometrial Bx. Could be early Perimenopausal changes or related to giving birth 12 months ago and resuming menses relatively recently. Pt declines pelvis exam today. Recommend that she return for Pap, review of US findings and eval of how OCPs are helping with cycle control. Doubt infection or polpys due to cycles bleeding.   Mammogram recommended at age 33.  Routine preventative health maintenance measures emphasized. Please refer to After Visit Summary for other counseling recommendations.      Manya Silvas, [place] for [employer], [place]

## 2022-03-26 NOTE — Patient Instructions (Signed)
Pelvic Ultrasound on Monday, February 5th, 2024 at 1:00PM.  Please arrive at 12:45 PM with a full bladder.  Location: [place]  Phone: [phone]

## 2022-04-12 ENCOUNTER — Ambulatory Visit: Admission: RE | Admit: 2022-04-12 | Payer: Medicaid Other | Source: Ambulatory Visit

## 2022-04-19 ENCOUNTER — Other Ambulatory Visit: Payer: Medicaid Other

## 2022-04-21 ENCOUNTER — Ambulatory Visit
Admission: RE | Admit: 2022-04-21 | Discharge: 2022-04-21 | Disposition: A | Discharge status: Home / Self Care | Payer: Medicaid Other | Source: Ambulatory Visit | Attending: Advanced Practice Midwife | Admitting: Advanced Practice Midwife

## 2022-04-21 DIAGNOSIS — N939 Abnormal uterine and vaginal bleeding, unspecified: Secondary | ICD-10-CM | POA: Insufficient documentation

## 2022-05-27 ENCOUNTER — Encounter: Payer: Self-pay | Admitting: Obstetrics and Gynecology

## 2022-05-27 ENCOUNTER — Other Ambulatory Visit: Payer: Self-pay

## 2022-05-27 ENCOUNTER — Ambulatory Visit (INDEPENDENT_AMBULATORY_CARE_PROVIDER_SITE_OTHER): Payer: Medicaid Other | Admitting: Obstetrics and Gynecology

## 2022-05-27 VITALS — BP 98/64 | HR 80 | Wt 120.8 lb

## 2022-05-27 DIAGNOSIS — N939 Abnormal uterine and vaginal bleeding, unspecified: Secondary | ICD-10-CM

## 2022-05-27 NOTE — Progress Notes (Signed)
NEW GYNECOLOGY PATIENT Patient name: Kelsey Ramirez MRN [id number]  Date of birth: [birth year] Chief Complaint:   abnomal bleeding      History:  Kelsey Ramirez is a 37 y.o. [phone] being seen today for AUB.   Gave birth late 2023, didn't have menses for 5 months thereafter and when it returned, it returned on regular timing but prolonged. Now for the last 3 months, cycle si 21-22 days long for 11 days at a time. First 3-4 days are very heavy and day 7 it's moderate then lightens until the end. Bad cramps early in the bleeding.   Had asked about IUD previously and wondering if it was placed  Gained weight since delivery No breast or nipple changes outside what is expected when breastfeeding Intercourse started - pain with intercourse, which is life long, no postcoital bleeding  Stopped breastfeeding when baby 8 months Other women from her country on [place] and wanting to know if she can take that as she would like to get pregnant quickly  Never started the birth control pills previously prescribed [place] with blind pap when not on her cycle     Gynecologic History Patient's last menstrual period was 05/24/2022. Contraception: none Last Pap: none Last Mammogram: n/a Last Colonoscopy: n/a  Obstetric History OB History  Gravida Para Term Preterm AB Living  4 3 3   1 3   SAB IAB Ectopic Multiple Live Births  1     0 3    # Outcome Date GA Lbr Len/2nd Weight Sex Delivery Anes PTL Lv  4 Term 04/02/21 [redacted]w[redacted]d  6 lb 13.2 oz (3.095 kg) F CS-LTranv Spinal  LIV  3 Term 12/30/16 [redacted]w[redacted]d  6 lb 11.1 oz (3.035 kg) M CS-LTranv EPI  LIV     Birth Comments: wnl  2 Term 09/10/15 [redacted]w[redacted]d  5 lb 15.2 oz (2.7 kg) M CS-LTranv   LIV     Complications: Delivery by elective cesarean section  1 SAB             Obstetric Comments  G1: in [place]. Pt states was elective due to female circ.     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     Gestational diabetes    Menorrhagia 06/19/2020   Nasal polyp     Past Surgical History:  Procedure Laterality Date   CESAREAN SECTION     CESAREAN SECTION N/A 12/30/2016   Procedure: REPEAT CESAREAN SECTION;  Surgeon: Jonnie Kind, MD;  Location: [place];  Service: Obstetrics;  Laterality: N/A;   CESAREAN SECTION N/A 04/02/2021   Procedure: CESAREAN SECTION;  Surgeon: Osborne Oman, MD;  [facility];  Service: Obstetrics;  Laterality: N/A;    Current Outpatient Medications on File Prior to Visit  Medication Sig Dispense Refill   folic acid (FOLVITE) 1 MG tablet Take 1 mg by mouth daily. (Patient not taking: Reported on 05/27/2022)     ibuprofen (ADVIL) 600 MG tablet Take 1 tablet (600 mg total) by mouth every 6 (six) hours as needed (pain). (Patient not taking: Reported on 05/13/2021) 40 tablet 0   Norethindrone Acetate-Ethinyl Estrad-FE (LOESTRIN 24 FE) 1-20 MG-MCG(24) tablet Take 1 tablet by mouth daily. (Patient not taking: Reported on 05/27/2022) 28 tablet 12   oxyCODONE (OXY IR/ROXICODONE) 5 MG immediate release tablet Take 1 tablet (5 mg total) by mouth every 6 (six) hours as needed  for severe pain or breakthrough pain. (Patient not taking: Reported on 05/13/2021) 12 tablet 0   Prenatal Vit-Fe Fumarate-FA (PREPLUS) 27-1 MG TABS Take 1 tablet by mouth daily. (Patient not taking: Reported on 05/27/2022) 30 tablet 13   VITAMIN D PO Take by mouth. (Patient not taking: Reported on 05/27/2022)     No current facility-administered medications on file prior to visit.    No Known Allergies  Social History:  reports that she has never smoked. She has never used smokeless tobacco. She reports that she does not drink alcohol and does not use drugs.  Family History  Problem Relation Age of Onset   Healthy Mother    Healthy Father    Cancer Neg Hx    Diabetes Neg Hx    Hypertension Neg Hx     The following portions of the patient's history were reviewed and updated as  appropriate: allergies, current medications, past family history, past medical history, past social history, past surgical history and problem list.  Review of Systems Pertinent items noted in HPI and remainder of comprehensive ROS otherwise negative.  Physical Exam:  BP 98/64   Pulse 80   Wt 120 lb 12.8 oz (54.8 kg)   LMP 05/24/2022   BMI 23.59 kg/m  Physical Exam     Assessment and Plan:   1. Abnormal uterine bleeding Labs obtained regarding fertility, though reassuring that patient was recently pregnant without issue. Unclear etiology of irregular bleeding other than possible PP transition but has been several months since BF ceased. Labs ordered today. Transabdominal pelvic US within normal limits. Discussed importance of pap smear. Previously decline pelvic exams due to discomfort of exams, suspect related to hx of female circumcision. Offered blind pap at follow up.  - ReproSURE - TSH Rfx on Abnormal to Free T4 - Prolactin  Routine preventative health maintenance measures emphasized. Please refer to After Visit Summary for other counseling recommendations.   Follow-up: No follow-ups on file.      Darliss Cheney, MD Obstetrician & Gynecologist, Faculty Practice [facility] for [employer], [place]

## 2022-06-05 LAB — REPROSURE
ANTI-MULLERIAN HORMONE (AMH): 0.83 ng/mL
Estradiol, Serum, MS: 33 pg/mL
Follicle Stimulating Hormone: 9.7 m[IU]/mL

## 2022-06-05 LAB — PROLACTIN: Prolactin: 21.6 ng/mL (ref 4.8–33.4)

## 2022-06-05 LAB — REPROSURE PATIENT EDUCATION

## 2022-06-05 LAB — TSH RFX ON ABNORMAL TO FREE T4: TSH: 2.33 u[IU]/mL (ref 0.450–4.500)

## 2022-06-08 ENCOUNTER — Telehealth: Payer: Self-pay

## 2022-06-08 NOTE — Telephone Encounter (Signed)
-----   Message from Darliss Cheney, MD sent at 06/08/2022  7:16 AM EDT ----- Notify patient that most labs normal. Ovarian reserve is low normal but appropriate for her age.  Offer follow up for pap

## 2022-06-09 NOTE — Telephone Encounter (Signed)
Attempted to contact pt with [facility] Interpreter # [phone] and left message to return call for results.   Kelsey Ramirez

## 2022-06-16 NOTE — Telephone Encounter (Signed)
Called patient with [place] interpreter [phone] & informed her of test results. Offered appt for pap smear and patient declines at this time stating she will call when she is ready. Patient had no questions

## 2022-07-10 ENCOUNTER — Ambulatory Visit
Admission: EM | Admit: 2022-07-10 | Discharge: 2022-07-10 | Disposition: A | Discharge status: Home / Self Care | Payer: BLUE CROSS/BLUE SHIELD | Attending: Nurse Practitioner | Admitting: Nurse Practitioner

## 2022-07-10 DIAGNOSIS — J069 Acute upper respiratory infection, unspecified: Secondary | ICD-10-CM

## 2022-07-10 DIAGNOSIS — H6123 Impacted cerumen, bilateral: Secondary | ICD-10-CM | POA: Diagnosis not present

## 2022-07-10 DIAGNOSIS — R42 Dizziness and giddiness: Secondary | ICD-10-CM

## 2022-07-10 DIAGNOSIS — N898 Other specified noninflammatory disorders of vagina: Secondary | ICD-10-CM | POA: Diagnosis not present

## 2022-07-10 LAB — POCT URINALYSIS DIP (MANUAL ENTRY)
Bilirubin, UA: NEGATIVE
Blood, UA: NEGATIVE
Glucose, UA: NEGATIVE mg/dL
Ketones, POC UA: NEGATIVE mg/dL
Leukocytes, UA: NEGATIVE
Nitrite, UA: NEGATIVE
Protein Ur, POC: NEGATIVE mg/dL
Spec Grav, UA: 1.025 (ref 1.010–1.025)
Urobilinogen, UA: 0.2 E.U./dL
pH, UA: 6 (ref 5.0–8.0)

## 2022-07-10 LAB — POCT FASTING CBG KUC MANUAL ENTRY: POCT Glucose (KUC): 91 mg/dL (ref 70–99)

## 2022-07-10 LAB — POCT RAPID STREP A (OFFICE): Rapid Strep A Screen: NEGATIVE

## 2022-07-10 MED ORDER — BENZONATATE 100 MG PO CAPS: 100.0000 mg | ORAL_CAPSULE | Freq: Three times a day (TID) | ORAL | 0 refills | Status: DC

## 2022-07-10 NOTE — Discharge Instructions (Signed)
You can take Tessalon as needed for cough.  Rest and get lots of fluids.  You can take over-the-counter meclizine as needed for dizziness/vertigo symptoms.  Please note this medication can make you drowsy.  Please follow-up with your PCP if your symptoms do not improve. Please go to the emergency room if you develop any worsening symptoms

## 2022-07-10 NOTE — ED Triage Notes (Addendum)
Pt presents with c/o lt ear pain, blurred vision, and a sweet taste in her mouth X 3 days.   Pt states she also has vaginal itching.

## 2022-07-10 NOTE — ED Provider Notes (Signed)
[facility]    CSN: [id number] Arrival date & time: 07/10/22  1215      History   Chief Complaint Chief Complaint  Patient presents with   Ear Pain   Vaginal Itching    HPI Kelsey Ramirez is a 37 y.o. female  presents for evaluation of URI symptoms for 3 days.  Patient is accompanied by his but helps to interpret as patient speaks Arabic.  Patient reports associated symptoms of cough, congestion, sore throat with reported sweet taste in her mouth, left and right ear pain, dizziness/vertigo symptoms.  She states she has intermittent dizziness that feels like the room is spinning.  During this time she has some blurry vision but states that resolves once the dizziness resolves.  No headaches, chest pain.  Denies N/V/D, fevers, body aches, shortness of breath. Patient does not have a hx of asthma or smoking. No known sick contacts.  She also reports some vaginal itching without discharge.  No dysuria.  No STD concern or exposure.  Pt has taken nothing OTC for symptoms. Pt has no other concerns at this time.    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 BIRTHING SUITES;  Service: Obstetrics;  Laterality: N/A;   CESAREAN SECTION N/A  04/02/2021   Procedure: CESAREAN SECTION;  Surgeon: Tereso Newcomer, MD;  Location: [facility] LD ORS;  Service: Obstetrics;  Laterality: N/A;    OB History     Gravida  4   Para  3   Term  3   Preterm      AB  1   Living  3      SAB  1   IAB      Ectopic      Multiple  0   Live Births  3        Obstetric Comments  G1: in [place]. Pt states was elective due to female circ.           Home Medications    Prior to Admission medications   Medication Sig Start Date End Date Taking? Authorizing Provider  benzonatate (TESSALON) 100 MG capsule Take 1 capsule (100 mg total) by mouth every 8 (eight) hours. 07/10/22  Yes Radford Pax, NP  folic acid (FOLVITE) 1 MG tablet Take 1 mg by mouth daily. Patient not taking: Reported on 05/27/2022 03/27/21   [provider]  ibuprofen (ADVIL) 600 MG tablet Take 1 tablet (600 mg total) by mouth every 6 (six) hours as needed (pain). Patient not taking: Reported on 05/13/2021 04/04/21   Worthy Rancher, MD  Norethindrone Acetate-Ethinyl Estrad-FE (LOESTRIN 24 FE) 1-20 MG-MCG(24) tablet Take 1 tablet by mouth daily. Patient not taking:  Reported on 05/27/2022 03/26/22   Katrinka Blazing, [place], CNM  oxyCODONE (OXY IR/ROXICODONE) 5 MG immediate release tablet Take 1 tablet (5 mg total) by mouth every 6 (six) hours as needed for severe pain or breakthrough pain. Patient not taking: Reported on 05/13/2021 04/04/21   Worthy Rancher, MD  Prenatal Vit-Fe Fumarate-FA (PREPLUS) 27-1 MG TABS Take 1 tablet by mouth daily. Patient not taking: Reported on 05/27/2022 01/07/21   Marylene Land, CNM  VITAMIN D PO Take by mouth. Patient not taking: Reported on 05/27/2022    [provider]    Family History Family History  Problem Relation Age of Onset   Healthy Mother    Healthy Father    Cancer Neg Hx    Diabetes Neg Hx    Hypertension Neg Hx     Social History Social History   Tobacco Use   Smoking status: Never    Smokeless tobacco: Never  Vaping Use   Vaping Use: Never used  Substance Use Topics   Alcohol use: No   Drug use: No     Allergies   Patient has no known allergies.   Review of Systems Review of Systems  HENT:  Positive for congestion, ear pain and sore throat.   Respiratory:  Positive for cough.   Genitourinary:        Vaginal itching     Physical Exam Triage Vital Signs ED Triage Vitals  Enc Vitals Group     BP 07/10/22 1315 114/76     Pulse Rate 07/10/22 1315 89     Resp 07/10/22 1315 16     Temp 07/10/22 1315 99.6 F (37.6 C)     Temp Source 07/10/22 1315 Oral     SpO2 07/10/22 1315 100 %     Weight --      Height --      Head Circumference --      Peak Flow --      Pain Score 07/10/22 1313 0     Pain Loc --      Pain Edu? --      Excl. in GC? --    No data found.  Updated Vital Signs BP 114/76 (BP Location: Right Arm)   Pulse 89   Temp 99.6 F (37.6 C) (Oral)   Resp 16   LMP 06/18/2022 (Exact Date)   SpO2 100%   Visual Acuity Right Eye Distance:   Left Eye Distance:   Bilateral Distance:    Right Eye Near:   Left Eye Near:    Bilateral Near:     Physical Exam Vitals and nursing note reviewed.  Constitutional:      General: She is not in acute distress.    Appearance: She is well-developed. She is not ill-appearing.  HENT:     Head: Normocephalic and atraumatic.     Right Ear: There is impacted cerumen.     Left Ear: There is impacted cerumen.     Ears:     Comments: Unable to visualize TMs due to impacted cerumen    Nose: Congestion present.     Mouth/Throat:     Mouth: Mucous membranes are moist.     Pharynx: Oropharynx is clear. Uvula midline. Posterior oropharyngeal erythema present.     Tonsils: No tonsillar exudate or tonsillar abscesses.  Eyes:     Conjunctiva/sclera: Conjunctivae normal.     Pupils: Pupils are equal, round, and reactive to light.  Cardiovascular:     Rate and Rhythm:  Normal rate and regular rhythm.      Heart sounds: Normal heart sounds.  Pulmonary:     Effort: Pulmonary effort is normal.     Breath sounds: Normal breath sounds.  Musculoskeletal:     Cervical back: Normal range of motion and neck supple.  Lymphadenopathy:     Cervical: No cervical adenopathy.  Skin:    General: Skin is warm and dry.  Neurological:     General: No focal deficit present.     Mental Status: She is alert and oriented to person, place, and time.  Psychiatric:        Mood and Affect: Mood normal.        Behavior: Behavior normal.      UC Treatments / Results  Labs (all labs ordered are listed, but only abnormal results are displayed) Labs Reviewed  POCT FASTING CBG [facility] MANUAL ENTRY  POCT URINALYSIS DIP (MANUAL ENTRY)  POCT RAPID STREP A (OFFICE)    EKG   Radiology No results found.  Procedures Procedures (including critical care time)  Medications Ordered in UC Medications - No data to display  Initial Impression / Assessment and Plan / UC Course  I have reviewed the triage vital signs and the nursing notes.  Pertinent labs & imaging results that were available during my care of the patient were reviewed by me and considered in my medical decision making (see chart for details).     Reviewed exam and symptoms with patient and husband.  Negative rapid strep, UA unremarkable.  Blood sugar checked given patient reported dizziness and result was normal. Patient declined ear irrigation.  She also declined vaginal swab(states sx are better) to check for BV or yeast and she also declined COVID testing Discussed with patient dizziness likely vertigo related but has not been able to visualize the TMs I cannot say if there is any infection or other issue.  Patient verbalized understanding and again declined irrigation Discussed viral upper respiratory infection and symptomatic treatment Tessalon as needed cough Trial of meclizine over-the-counter as needed for vertigo symptoms Rest and  fluids PCP follow-up if symptoms do not improve ER precautions reviewed and patient verbalized understanding  Final Clinical Impressions(s) / UC Diagnoses   Final diagnoses:  Viral upper respiratory infection  Bilateral impacted cerumen  Vertigo  Vaginal itching     Discharge Instructions      You can take Tessalon as needed for cough.  Rest and get lots of fluids.  You can take over-the-counter meclizine as needed for dizziness/vertigo symptoms.  Please note this medication can make you drowsy.  Please follow-up with your PCP if your symptoms do not improve. Please go to the emergency room if you develop any worsening symptoms     ED Prescriptions     Medication Sig Dispense Auth. Provider   benzonatate (TESSALON) 100 MG capsule Take 1 capsule (100 mg total) by mouth every 8 (eight) hours. 21 capsule Radford Pax, NP      PDMP not reviewed this encounter.   Radford Pax, NP 07/10/22 1356

## 2022-07-10 NOTE — ED Notes (Signed)
Pt c/o dizziness

## 2023-01-28 ENCOUNTER — Other Ambulatory Visit (HOSPITAL_COMMUNITY)
Admission: RE | Admit: 2023-01-28 | Discharge: 2023-01-28 | Disposition: A | Discharge status: Home / Self Care | Payer: Medicaid Other | Source: Ambulatory Visit | Attending: Family Medicine | Admitting: Family Medicine

## 2023-01-28 ENCOUNTER — Ambulatory Visit: Payer: Medicaid Other

## 2023-01-28 ENCOUNTER — Other Ambulatory Visit: Payer: Self-pay

## 2023-01-28 VITALS — BP 119/74 | HR 85 | Ht 60.0 in | Wt 124.0 lb

## 2023-01-28 DIAGNOSIS — N760 Acute vaginitis: Secondary | ICD-10-CM

## 2023-01-28 NOTE — Progress Notes (Signed)
Patient here today for vaginal itchiness. Patient [place] and was treated for an infection for vaginal odor, yellow/green discharge and vaginal itchiness. Patient instructed how to obtain vaginal swab. Patient denies any urinary symptoms. Patient also informed that she is concerned about her long menstrual cycles that lasts approximately 10 days and would like to get an [place] for fibroids.Notified patient she will need a GYN appointment and is due for a pap smear. Front office to schedule appointment. All questions answered and denies any other needs at this time.   Marcelino Duster, RN

## 2023-01-31 ENCOUNTER — Encounter: Payer: Self-pay | Admitting: Certified Nurse Midwife

## 2023-01-31 ENCOUNTER — Other Ambulatory Visit: Payer: Self-pay | Admitting: Certified Nurse Midwife

## 2023-01-31 DIAGNOSIS — N760 Acute vaginitis: Secondary | ICD-10-CM

## 2023-01-31 LAB — CERVICOVAGINAL ANCILLARY ONLY
Bacterial Vaginitis (gardnerella): POSITIVE — AB
Candida Glabrata: NEGATIVE
Candida Vaginitis: POSITIVE — AB
Chlamydia: NEGATIVE
Comment: NEGATIVE
Comment: NEGATIVE
Comment: NEGATIVE
Comment: NEGATIVE
Comment: NORMAL
Neisseria Gonorrhea: NEGATIVE

## 2023-01-31 MED ORDER — MICONAZOLE NITRATE 2 % EX CREA: 1.0000 | TOPICAL_CREAM | Freq: Two times a day (BID) | CUTANEOUS | 0 refills | Status: AC

## 2023-01-31 MED ORDER — METRONIDAZOLE 500 MG PO TABS: 500.0000 mg | ORAL_TABLET | Freq: Two times a day (BID) | ORAL | 0 refills | Status: DC

## 2023-01-31 NOTE — Progress Notes (Signed)
Previously collected swab showed BV and candiasis.   Lamont Snowball, MSN, CNM, RNC-OB Certified Nurse Midwife, [facility] 01/31/2023 3:45 PM

## 2023-02-07 ENCOUNTER — Emergency Department (HOSPITAL_BASED_OUTPATIENT_CLINIC_OR_DEPARTMENT_OTHER)
Admission: EM | Admit: 2023-02-07 | Discharge: 2023-02-08 | Disposition: A | Discharge status: Home / Self Care | Payer: Medicaid Other | Attending: Emergency Medicine | Admitting: Emergency Medicine

## 2023-02-07 ENCOUNTER — Encounter (HOSPITAL_BASED_OUTPATIENT_CLINIC_OR_DEPARTMENT_OTHER): Payer: Self-pay

## 2023-02-07 ENCOUNTER — Emergency Department (HOSPITAL_BASED_OUTPATIENT_CLINIC_OR_DEPARTMENT_OTHER): Payer: Medicaid Other | Admitting: Radiology

## 2023-02-07 ENCOUNTER — Other Ambulatory Visit: Payer: Self-pay

## 2023-02-07 DIAGNOSIS — F419 Anxiety disorder, unspecified: Secondary | ICD-10-CM | POA: Insufficient documentation

## 2023-02-07 DIAGNOSIS — R079 Chest pain, unspecified: Secondary | ICD-10-CM | POA: Diagnosis present

## 2023-02-07 DIAGNOSIS — R0789 Other chest pain: Secondary | ICD-10-CM | POA: Diagnosis not present

## 2023-02-07 LAB — BASIC METABOLIC PANEL
Anion gap: 10 (ref 5–15)
BUN: 6 mg/dL (ref 6–20)
CO2: 24 mmol/L (ref 22–32)
Calcium: 9.4 mg/dL (ref 8.9–10.3)
Chloride: 102 mmol/L (ref 98–111)
Creatinine, Ser: 0.56 mg/dL (ref 0.44–1.00)
GFR, Estimated: >60 mL/min (ref >60)
Glucose, Bld: 129 mg/dL — ABNORMAL HIGH (ref 70–99)
Potassium: 3.2 mmol/L — ABNORMAL LOW (ref 3.5–5.1)
Sodium: 136 mmol/L (ref 135–145)

## 2023-02-07 LAB — CBC
HCT: 38.7 % (ref 36.0–46.0)
Hemoglobin: 12.7 g/dL (ref 12.0–15.0)
MCH: 26.6 pg (ref 26.0–34.0)
MCHC: 32.8 g/dL (ref 30.0–36.0)
MCV: 81.1 fL (ref 80.0–100.0)
Platelets: 398 10*3/uL (ref 150–400)
RBC: 4.77 MIL/uL (ref 3.87–5.11)
RDW: 13.6 % (ref 11.5–15.5)
WBC: 7.8 10*3/uL (ref 4.0–10.5)
nRBC: 0 % (ref 0.0–0.2)

## 2023-02-07 LAB — HCG, SERUM, QUALITATIVE: Preg, Serum: NEGATIVE

## 2023-02-07 LAB — TROPONIN I (HIGH SENSITIVITY): Troponin I (High Sensitivity): <2 ng/L (ref <18)

## 2023-02-07 NOTE — ED Triage Notes (Signed)
Pt arrives with c/o left sided chest pain that started a few hours ago. Pt denies SOB.

## 2023-02-08 LAB — D-DIMER, QUANTITATIVE: D-Dimer, Quant: <0.27 ug{FEU}/mL (ref 0.00–0.50)

## 2023-02-08 LAB — TROPONIN I (HIGH SENSITIVITY): Troponin I (High Sensitivity): <2 ng/L (ref <18)

## 2023-02-08 NOTE — ED Provider Notes (Signed)
[place] EMERGENCY DEPARTMENT AT [facility] Provider Note   CSN: [id number] Arrival date & time: 02/07/23  2030     History  Chief Complaint  Patient presents with   Chest Pain    Kelsey Ramirez is a 37 y.o. female.  HPI     History taken with Print production planner.  This is a 37 year old female who presents with chest pain.  Patient describes a sharp stabbing left-sided chest pain.  She has been having chest pain for over 1 week.  She saw her primary doctor and has been referred to cardiology.  She has an appointment at the end of the month.  Per her husband since seeing her primary doctor she has been very anxious about her symptoms.  She describes intermittent left-sided chest pain.  No fever or cough.  No leg swelling or history of blood clots.  Denies shortness of breath.  She has not taken anything for her symptoms.  Home Medications Prior to Admission medications   Medication Sig Start Date End Date Taking? Authorizing Provider  folic acid (FOLVITE) 1 MG tablet Take 1 mg by mouth daily. 03/27/21   [provider]  ibuprofen (ADVIL) 600 MG tablet Take 1 tablet (600 mg total) by mouth every 6 (six) hours as needed (pain). Patient not taking: Reported on 05/13/2021 04/04/21   Worthy Rancher, MD  metroNIDAZOLE (FLAGYL) 500 MG tablet Take 1 tablet (500 mg total) by mouth 2 (two) times daily. 01/31/23   [place], Clarita Crane, CNM  miconazole (MICATIN) 2 % cream Apply 1 Application topically 2 (two) times daily. 01/31/23   Warren-Hill, Clarita Crane, CNM  Norethindrone Acetate-Ethinyl Estrad-FE (LOESTRIN 24 FE) 1-20 MG-MCG(24) tablet Take 1 tablet by mouth daily. Patient not taking: Reported on 05/27/2022 03/26/22   Dorathy Kinsman, CNM      Allergies    Patient has no known allergies.    Review of Systems   Review of Systems  Constitutional:  Negative for fever.  Respiratory:  Negative for shortness of breath.   Cardiovascular:  Positive for chest  pain. Negative for leg swelling.  All other systems reviewed and are negative.   Physical Exam Updated Vital Signs BP (!) 92/54   Pulse 85   Temp 98.2 F (36.8 C)   Resp (!) 23   Ht 1.524 m (5')   Wt 56.2 kg   LMP 01/14/2023 (Exact Date)   SpO2 100%   BMI 24.22 kg/m  Physical Exam Vitals and nursing note reviewed.  Constitutional:      Appearance: She is well-developed. She is not ill-appearing.  HENT:     Head: Normocephalic and atraumatic.  Eyes:     Pupils: Pupils are equal, round, and reactive to light.  Cardiovascular:     Rate and Rhythm: Normal rate and regular rhythm.     Heart sounds: Normal heart sounds.  Pulmonary:     Effort: Pulmonary effort is normal. No respiratory distress.     Breath sounds: No wheezing.  Chest:     Chest wall: Tenderness present.  Abdominal:     General: Bowel sounds are normal.     Palpations: Abdomen is soft.  Musculoskeletal:     Cervical back: Neck supple.  Skin:    General: Skin is warm and dry.  Neurological:     Mental Status: She is alert and oriented to person, place, and time.  Psychiatric:        Mood and Affect: Mood is anxious.  ED Results / Procedures / Treatments   Labs (all labs ordered are listed, but only abnormal results are displayed) Labs Reviewed  BASIC METABOLIC PANEL - Abnormal; Notable for the following components:      Result Value   Potassium 3.2 (*)    Glucose, Bld 129 (*)    All other components within normal limits  CBC  HCG, SERUM, QUALITATIVE  D-DIMER, QUANTITATIVE  TROPONIN I (HIGH SENSITIVITY)  TROPONIN I (HIGH SENSITIVITY)    EKG EKG Interpretation Date/Time:  Monday February 07 2023 20:42:46 EST Ventricular Rate:  102 PR Interval:  120 QRS Duration:  70 QT Interval:  338 QTC Calculation: 440 R Axis:   -24  Text Interpretation: Sinus tachycardia Low voltage QRS ST & T wave abnormality, consider anterolateral ischemia Abnormal ECG No previous ECGs available no prior  Confirmed by Ross Marcus ([id number]) on 02/08/2023 1:05:48 AM  Radiology DG Chest 2 View  Result Date: 02/07/2023 CLINICAL DATA:  Left-sided chest pain. EXAM: CHEST - 2 VIEW COMPARISON:  None Available. FINDINGS: No focal consolidation, pleural effusion, pneumothorax. The cardiac silhouette is within normal limits. No acute osseous pathology. IMPRESSION: No active cardiopulmonary disease. Electronically Signed   By: Elgie Collard M.D.   On: 02/07/2023 23:15    Procedures Procedures    Medications Ordered in ED Medications - No data to display  ED Course/ Medical Decision Making/ A&P                                 Medical Decision Making Amount and/or Complexity of Data Reviewed Labs: ordered. Radiology: ordered.   This patient presents to the ED for concern of chest pain, this involves an extensive number of treatment options, and is a complaint that carries with it a high risk of complications and morbidity.  I considered the following differential and admission for this acute, potentially life threatening condition.  The differential diagnosis includes ACS, PE, pneumothorax, pneumonia, chest wall pain  MDM:    This is a 37 year old female who presents with chest pain.  She is nontoxic but anxious appearing.  She reports anxiety related to her symptoms.  She has reproducible pain on exam.  She is mildly tachycardic on her EKG.  No significant signs of ischemia or arrhythmia.  Troponin x 2 negative.  Doubt primary ACS.  Screening D-dimer was sent and is negative.  This effectively rules out PE.  Chest x-ray without pneumothorax or pneumonia.  Otherwise her lab work is reassuring.  Patient and her family reassured.  Recommend she follow-up with cardiology as scheduled.  (Labs, imaging, consults)  Labs: I Ordered, and personally interpreted labs.  The pertinent results include: CBC, BMP, troponin x 2, D-dimer  Imaging Studies ordered: I ordered imaging studies including chest  x-ray I independently visualized and interpreted imaging. I agree with the radiologist interpretation  Additional history obtained from husband at bedside.  External records from outside source obtained and reviewed including prior evaluations  Cardiac Monitoring: The patient was maintained on a cardiac monitor.  If on the cardiac monitor, I personally viewed and interpreted the cardiac monitored which showed an underlying rhythm of: Sinus  Reevaluation: After the interventions noted above, I reevaluated the patient and found that they have :stayed the same  Social Determinants of Health:  lives independently  Disposition: Discharge  Co morbidities that complicate the patient evaluation  Past Medical History:  Diagnosis Date   Anemia  last pregnancy   Cervical polyp    Female circumcision    Gestational diabetes    Menorrhagia 06/19/2020   Nasal polyp      Medicines No orders of the defined types were placed in this encounter.   I have reviewed the patients home medicines and have made adjustments as needed  Problem List / ED Course: Problem List Items Addressed This Visit   None Visit Diagnoses     Atypical chest pain    -  Primary   Anxious mood                       Final Clinical Impression(s) / ED Diagnoses Final diagnoses:  Atypical chest pain  Anxious mood    Rx / DC Orders ED Discharge Orders     None         Shon Baton, MD 02/08/23 0111

## 2023-02-08 NOTE — Discharge Instructions (Signed)
You were seen today for chest pain.  Your workup including heart testing and screening test for blood clots are negative.  There does not appear to be an emergent cause of your chest pain.  Follow-up with cardiology as scheduled.  You seem quite anxious about your symptoms.  Follow-up with your primary doctor.  ??? ??? ????? ????? ???? ??? ?? ?????.  ?? ????? ???? ??? ?? ??? ???????? ????? ??????? ??? ????? ???? ?????.  ?? ???? ?? ???? ????? ?????? ???? ????.  ?????? ????? ????? ??? ?? ????.  ???? ????? ???? ???? ??????? ???? ????? ????.  ???????? ?? ????? ???????. laqad tamat ruyatuk alyawm bisabab 'alam fi alsadri. 'iina natayij eamalik bima fi dhalik aikhtibarat alqalb waikhtibar fahs jalatat aldam salbiatun. la yabdu 'ana hunak sbban taryan li'alam sadraka. mutabaeat 'amrad alqalb kama hu muqararun. tabdu qlqan jdan bishan al'aerad alati tueani minha. almutabaeat mae tabibik al'asasii.

## 2023-03-10 ENCOUNTER — Other Ambulatory Visit (HOSPITAL_COMMUNITY)
Admission: RE | Admit: 2023-03-10 | Discharge: 2023-03-10 | Disposition: A | Discharge status: Home / Self Care | Payer: Medicaid Other | Source: Ambulatory Visit | Attending: Obstetrics and Gynecology | Admitting: Obstetrics and Gynecology

## 2023-03-10 ENCOUNTER — Ambulatory Visit: Payer: Medicaid Other | Admitting: Obstetrics and Gynecology

## 2023-03-10 ENCOUNTER — Other Ambulatory Visit: Payer: Self-pay

## 2023-03-10 VITALS — BP 106/73 | HR 92 | Wt 117.0 lb

## 2023-03-10 DIAGNOSIS — N760 Acute vaginitis: Secondary | ICD-10-CM | POA: Diagnosis present

## 2023-03-10 DIAGNOSIS — N393 Stress incontinence (female) (male): Secondary | ICD-10-CM

## 2023-03-10 DIAGNOSIS — F419 Anxiety disorder, unspecified: Secondary | ICD-10-CM

## 2023-03-10 DIAGNOSIS — N939 Abnormal uterine and vaginal bleeding, unspecified: Secondary | ICD-10-CM

## 2023-03-10 MED ORDER — LORAZEPAM 1 MG PO TABS: 1.0000 mg | ORAL_TABLET | Freq: Once | ORAL | 0 refills | Status: AC

## 2023-03-10 MED ORDER — NORETHIN ACE-ETH ESTRAD-FE 1-20 MG-MCG(24) PO TABS
1.0000 | ORAL_TABLET | Freq: Every day | ORAL | 12 refills | Status: DC
Start: 2023-03-10 — End: 2023-05-04

## 2023-03-10 MED ORDER — FLUCONAZOLE 150 MG PO TABS: 150.0000 mg | ORAL_TABLET | Freq: Once | ORAL | 1 refills | Status: AC

## 2023-03-10 NOTE — Progress Notes (Signed)
 GYNECOLOGY VISIT  Patient name: Kelsey Ramirez MRN [id number]  Date of birth: [birth year] Chief Complaint:   Gynecologic Exam  History:  Kelsey Ramirez is a 38 y.o. [id number] being seen today for follow up.    #Vaginal discharge Previous epiosde was worse than current; vaginal odor, discharge and itching present. Now primarily the itching is present. Told that her and her husband would be treated. She took the antibiotic prescribed but the pharmacy did not have the cream. She reports husband previously having some itching as well but that has resolved. Husband is circumcised.   #Menstrual concerns She has a menses every 28 days but it may take up to 12 days to complete her menses. She was previously prescribed birth control but did not take it but is interested in starting as she was told this would help her bleeding. She does not have any [facility] contraindications. She remembers being told previously that she had a polyp but that was during pregnancy. Did not have prolonged bleeding prior to last pregnancy.   #Pelvic Pain Has not previously been able to tolerate pelvic exams and is very nervous about having an exam done in clinic but also not wanting to go to the operating room to have completed. Wondering if done in clinic are there measures that can be taken to make it more comfortable.   #Urinary incontinence Concerned for possible urinary tract infection due to losing small volumes of urine with sneezing/coughing. No dysuria or hematuria present. Denies pelvic pain.     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    Gestational diabetes    Menorrhagia 06/19/2020   Nasal polyp     Past Surgical History:  Procedure Laterality Date   CESAREAN SECTION     CESAREAN SECTION N/A 12/30/2016   Procedure: REPEAT CESAREAN SECTION;  Surgeon: Edsel Norleen GAILS, MD;  Location: [facility];   Service: Obstetrics;  Laterality: N/A;   CESAREAN SECTION N/A 04/02/2021   Procedure: CESAREAN SECTION;  Surgeon: Herchel Gloris LABOR, MD;  Location: [facility];  Service: Obstetrics;  Laterality: N/A;    The following portions of the patient's history were reviewed and updated as appropriate: allergies, current medications, past family history, past medical history, past social history, past surgical history and problem list.   Health Maintenance:   Last pap No results found for: DIAGPAP, HPVHIGH, ADEQPAP Last mammogram: n/a   Review of Systems:  Pertinent items are noted in HPI. Comprehensive review of systems was otherwise negative.   Objective:  Physical Exam BP 106/73   Pulse 92   Wt 117 lb (53.1 kg)   LMP 03/10/2023 (Exact Date)   BMI 22.85 kg/m    Physical Exam Vitals and nursing note reviewed.  Constitutional:      Appearance: Normal appearance.  HENT:     Head: Normocephalic and atraumatic.  Pulmonary:     Effort: Pulmonary effort is normal.  Abdominal:     Palpations: Abdomen is soft.     Tenderness: There is no abdominal tenderness.  Skin:    General: Skin is warm and dry.  Neurological:     General: No focal deficit present.     Mental Status: She is alert.  Psychiatric:        Mood and Affect: Mood normal.        Behavior: Behavior normal.  Thought Content: Thought content normal.        Judgment: Judgment normal.    Labs 05/27/2022 AMH 0.83, Estradiol  33, FSH 9.7, TSH 2.33, Prolactin 21.6  Labs and Imaging CLINICAL DATA:  Abnormal uterine bleeding. Prior C-section x3. LMP 04/04/2022   EXAM: TRANSABDOMINAL ULTRASOUND OF PELVIS   TECHNIQUE: Transabdominal ultrasound examination of the pelvis was performed including evaluation of the uterus, ovaries, adnexal regions, and pelvic cul-de-sac.   COMPARISON:  Prior OB ultrasound exams   FINDINGS: Uterus   Measurements: 8.1 x 3.7 x 5.8 centimeters = volume: 91.8 ML. Anteverted and normal  in appearance.   Endometrium   Thickness: 7.6 millimeters.  No focal abnormality visualized.   Right ovary   Measurements: 3.7 x 1.5 x 2.1 centimeters = volume: 5.8 mL. Normal appearance/no adnexal mass.   Left ovary   Measurements: 2.5 x 1.5 x 2.0 centimeters = volume: 4.0 mL. Normal appearance/no adnexal mass.   Other findings:  No abnormal free fluid.   IMPRESSION: Normal pelvic ultrasound.     Electronically Signed   By: Almarie Daring M.D.   On: 04/23/2022 13:19     Assessment & Plan:   1. Acute vaginitis (Primary) Self swab vaginitis completed. Since miconazole  was not picked up, will send diflucan  for untreated yeast infection.  - Cervicovaginal ancillary only - fluconazole  (DIFLUCAN ) 150 MG tablet; Take 1 tablet (150 mg total) by mouth once for 1 dose. Can take additional dose three days later if symptoms persist  Dispense: 1 tablet; Refill: 1  2. Anxiety Given ativan  to bring with her day of visit to complete pap and assess for cervical polyp. Will plan for lidocaine  jelly in the vagina and at introitus. Instructed to not take medication prior to arrival and signing of consent in case of polyp removal in clinic.  - LORazepam  (ATIVAN ) 1 MG tablet; Take 1 tablet (1 mg total) by mouth once for 1 dose. Bring day of clinic for exam  Dispense: 1 tablet; Refill: 0  3. Abnormal uterine bleeding (AUB) Labs and ultrasound unremarkable. Concern for possible cervical polyp based on prior documentation. Offered EUA vs exam in clinic - patient opted for exam in clinic with anxiolytic and topical anesthetic.  - Norethindrone Acetate-Ethinyl Estrad-FE (LOESTRIN 24 FE) 1-20 MG-MCG(24) tablet; Take 1 tablet by mouth daily.  Dispense: 28 tablet; Refill: 12  4. SUI (stress urinary incontinence, female) Suspect secondary to prior pregnancies. Urine sent for UA- too small of volume for culture to be sent.    Routine preventative health maintenance measures emphasized.  Carter Quarry, MD [facility]

## 2023-03-11 ENCOUNTER — Other Ambulatory Visit: Payer: Self-pay | Admitting: Obstetrics and Gynecology

## 2023-03-11 DIAGNOSIS — B3731 Acute candidiasis of vulva and vagina: Secondary | ICD-10-CM

## 2023-03-11 DIAGNOSIS — B9689 Other specified bacterial agents as the cause of diseases classified elsewhere: Secondary | ICD-10-CM

## 2023-03-11 DIAGNOSIS — N760 Acute vaginitis: Secondary | ICD-10-CM

## 2023-03-11 LAB — CERVICOVAGINAL ANCILLARY ONLY
Bacterial Vaginitis (gardnerella): POSITIVE — AB
Candida Glabrata: NEGATIVE
Candida Vaginitis: POSITIVE — AB
Chlamydia: NEGATIVE
Comment: NEGATIVE
Comment: NEGATIVE
Comment: NEGATIVE
Comment: NEGATIVE
Comment: NEGATIVE
Comment: NORMAL
Neisseria Gonorrhea: NEGATIVE
Trichomonas: NEGATIVE

## 2023-03-11 MED ORDER — METRONIDAZOLE 500 MG PO TABS: 500.0000 mg | ORAL_TABLET | Freq: Two times a day (BID) | ORAL | 0 refills | Status: AC

## 2023-03-11 MED ORDER — FLUCONAZOLE 150 MG PO TABS: 150.0000 mg | ORAL_TABLET | Freq: Once | ORAL | 1 refills | Status: AC

## 2023-03-14 ENCOUNTER — Encounter (HOSPITAL_COMMUNITY): Payer: Self-pay | Admitting: Emergency Medicine

## 2023-03-14 ENCOUNTER — Ambulatory Visit (HOSPITAL_COMMUNITY)
Admission: EM | Admit: 2023-03-14 | Discharge: 2023-03-14 | Disposition: A | Discharge status: Home / Self Care | Payer: Medicaid Other | Attending: Emergency Medicine | Admitting: Emergency Medicine

## 2023-03-14 ENCOUNTER — Other Ambulatory Visit: Payer: Self-pay

## 2023-03-14 DIAGNOSIS — N3000 Acute cystitis without hematuria: Secondary | ICD-10-CM | POA: Diagnosis present

## 2023-03-14 DIAGNOSIS — R051 Acute cough: Secondary | ICD-10-CM | POA: Insufficient documentation

## 2023-03-14 LAB — POCT URINALYSIS DIP (MANUAL ENTRY)
Bilirubin, UA: NEGATIVE
Glucose, UA: NEGATIVE mg/dL
Ketones, POC UA: NEGATIVE mg/dL
Nitrite, UA: NEGATIVE
Protein Ur, POC: NEGATIVE mg/dL
Spec Grav, UA: <=1.005 — AB (ref 1.010–1.025)
Urobilinogen, UA: 0.2 U/dL
pH, UA: 6 (ref 5.0–8.0)

## 2023-03-14 MED ORDER — PHENAZOPYRIDINE HCL 200 MG PO TABS: 200.0000 mg | ORAL_TABLET | Freq: Three times a day (TID) | ORAL | 0 refills | Status: AC

## 2023-03-14 MED ORDER — NITROFURANTOIN MONOHYD MACRO 100 MG PO CAPS: 100.0000 mg | ORAL_CAPSULE | Freq: Two times a day (BID) | ORAL | 0 refills | Status: DC

## 2023-03-14 NOTE — ED Provider Notes (Signed)
 [facility]    CSN: [id number] Arrival date & time: 03/14/23  1908      History   Chief Complaint Chief Complaint  Patient presents with   Dysuria    HPI Kelsey Ramirez is a 38 y.o. female.   Patient is accompanied by her husband.  They have declined translator at this time.  Patient is reporting painful urination especially with decreased urine.  She does report some bladder spasms with each urination.  She reports she is currently being treated for vaginitis with Flagyl .   Patient is also reporting moist cough x 2 days  No fever.    The history is provided by the patient and the spouse. The history is limited by a language barrier. No language interpreter was used (declined).  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    Past Surgical History:  Procedure Laterality Date   CESAREAN SECTION     CESAREAN SECTION N/A 12/30/2016   Procedure: REPEAT CESAREAN SECTION;  Surgeon: Edsel Norleen GAILS, MD;  Location: [facility] SU[facility];  Service: Obstetrics;  Laterality: N/A;   CESAREAN SECTION N/A 04/02/2021   Procedure: CESAREAN SECTION;  Surgeon: Herchel Gloris LABOR, MD;  Location: [facility];  Service: Obstetrics;  Laterality: N/A;    OB History     Gravida  4   Para  3   Term  3   Preterm      AB  1   Living  3      SAB  1   IAB      Ectopic      Multiple  0   Live Births  3        Obstetric  Comments  G1: in [place]. Pt states was elective due to female circ.           Home Medications    Prior to Admission medications   Medication Sig Start Date End Date Taking? Authorizing Provider  nitrofurantoin , macrocrystal-monohydrate, (MACROBID ) 100 MG capsule Take 1 capsule (100 mg total) by mouth 2 (two) times daily. 03/14/23  Yes Christoher Drudge, Marval HERO, NP  phenazopyridine  (PYRIDIUM ) 200 MG tablet Take 1 tablet (200 mg total) by mouth 3 (three) times daily. 03/14/23  Yes Jett Kulzer, Marval HERO, NP  folic acid  (FOLVITE ) 1 MG tablet Take 1 mg by mouth daily. Patient not taking: Reported on 03/10/2023 03/27/21   [provider]  ibuprofen  (ADVIL ) 600 MG tablet Take 1 tablet (600 mg total) by mouth every 6 (six) hours as needed (pain). Patient not taking: Reported on 03/10/2023 04/04/21   Clem Tawni HERO, MD  metroNIDAZOLE  (FLAGYL ) 500 MG tablet Take 1 tablet (500 mg total) by mouth 2 (two) times daily for 7 days. 03/11/23 03/18/23  Ajewole, Christana, MD  miconazole  (MICATIN) 2 % cream Apply 1 Application topically 2 (two) times daily. Patient not taking: Reported on 03/10/2023 01/31/23   Warren-Hill,  Camie LABOR, CNM  Norethindrone Acetate-Ethinyl Estrad-FE (LOESTRIN 24 FE) 1-20 MG-MCG(24) tablet Take 1 tablet by mouth daily. 03/10/23   Jeralyn Crutch, MD    Family History Family History  Problem Relation Age of Onset   Healthy Mother    Healthy Father    Cancer Neg Hx    Diabetes Neg Hx    Hypertension Neg Hx     Social History Social History   Tobacco Use   Smoking status: Never   Smokeless tobacco: Never  Vaping Use   Vaping status: Never Used  Substance Use Topics   Alcohol use: No   Drug use: No     Allergies   Patient has no known allergies.   Review of Systems Review of Systems  HENT: Negative.    Respiratory:  Positive for cough.   Genitourinary:  Positive for dysuria and pelvic pain.       Bladder spasms  All other systems reviewed and are  negative.    Physical Exam Triage Vital Signs ED Triage Vitals  Encounter Vitals Group     BP 03/14/23 2010 113/76     Systolic BP Percentile --      Diastolic BP Percentile --      Pulse Rate 03/14/23 2010 84     Resp 03/14/23 2010 18     Temp 03/14/23 2010 98.1 F (36.7 C)     Temp src --      SpO2 03/14/23 2010 100 %     Weight --      Height --      Head Circumference --      Peak Flow --      Pain Score 03/14/23 2011 10     Pain Loc --      Pain Education --      Exclude from Growth Chart --    No data found.  Updated Vital Signs BP 113/76   Pulse 84   Temp 98.1 F (36.7 C)   Resp 18   LMP 03/10/2023 (Exact Date)   SpO2 100%   Visual Acuity Right Eye Distance:   Left Eye Distance:   Bilateral Distance:    Right Eye Near:   Left Eye Near:    Bilateral Near:     Physical Exam Constitutional:      Appearance: Normal appearance.  Cardiovascular:     Rate and Rhythm: Normal rate and regular rhythm.  Pulmonary:     Effort: Pulmonary effort is normal.     Breath sounds: Normal breath sounds.  Abdominal:     Tenderness: There is abdominal tenderness. There is no right CVA tenderness or left CVA tenderness.     Comments: Suprapubic tenderness   Genitourinary:    Vagina: Vaginal discharge present.     Comments: Currently being treated for acute vaginitis Skin:    Findings: No rash.  Neurological:     Mental Status: She is alert.      UC Treatments / Results  Labs (all labs ordered are listed, but only abnormal results are displayed) Labs Reviewed  POCT URINALYSIS DIP (MANUAL ENTRY) - Abnormal; Notable for the following components:      Result Value   Spec Grav, UA <=1.005 (*)    Blood, UA trace-intact (*)    Leukocytes, UA Trace (*)    All other components within normal limits  URINE CULTURE    EKG   Radiology No results found.  Procedures Procedures (including critical care time)  Medications Ordered  in UC Medications - No data to  display  Initial Impression / Assessment and Plan / UC Course  I have reviewed the triage vital signs and the nursing notes.  Pertinent labs & imaging results that were available during my care of the patient were reviewed by me and considered in my medical decision making (see chart for details).   Patient is accompanied by husband.  She is experiencing symptoms of UTI with bladder spasms and pain on urination x 2 days.  Patient has increased water  to help with bladder spasms and dysuria.  She is currently being treated for Vaginitis.  Urine dipstick shows leukocytes with trace RBC. She is negative for CVA tenderness and fever.   We will send for culture.  Macrobid  and pyridium  ordered.    Patient is also reporting acute cough x 2 days, she states that it is moist.  Lungs are clear on auscultation, no fever.  Mucinex OTC recommended.   Final Clinical Impressions(s) / UC Diagnoses   Final diagnoses:  Acute cystitis without hematuria  Acute cough     Discharge Instructions      Macrobid  for possible bladder infection.  Urine is sent for culture - we will call you with the final results if the medication ordered is not appropriate.  Pyridum as needed for bladder spasms.  This will turn urine orange and will stain your underwear.   You should drink at least 2L of water  daily.    You can buy Mucinex 600mg  twice daily as needed for cough     ED Prescriptions     Medication Sig Dispense Auth. Provider   nitrofurantoin , macrocrystal-monohydrate, (MACROBID ) 100 MG capsule Take 1 capsule (100 mg total) by mouth 2 (two) times daily. 10 capsule Annalysa Mohammad, Marval HERO, NP   phenazopyridine  (PYRIDIUM ) 200 MG tablet Take 1 tablet (200 mg total) by mouth 3 (three) times daily. 6 tablet Akeela Busk, Marval HERO, NP      PDMP not reviewed this encounter.   Sumner Marval HERO, NP 03/14/23 2106

## 2023-03-14 NOTE — ED Triage Notes (Signed)
 PT reports dysuria

## 2023-03-14 NOTE — Discharge Instructions (Addendum)
 Macrobid  for possible bladder infection.  Urine is sent for culture - we will call you with the final results if the medication ordered is not appropriate.  Pyridum as needed for bladder spasms.  This will turn urine orange and will stain your underwear.   You should drink at least 2L of water  daily.    You can buy Mucinex 600mg  twice daily as needed for cough

## 2023-03-16 ENCOUNTER — Telehealth: Payer: Self-pay

## 2023-03-16 NOTE — Telephone Encounter (Signed)
-----   Message from Lorriane Shire sent at 03/11/2023  2:33 PM EST ----- Notify BV and yeast on swab and prescription sent to pharmacy

## 2023-03-16 NOTE — Telephone Encounter (Signed)
 CMA spoke to patient. Patient verified DOB and Name.  [employer] Arabic Interpreter assist with the telephone call.  Informed on cervical ancillary swab results and Rx sent. Patient is aware.   Patient is very nervous of her upcoming appt. On 04/20/2023 for pap smear examination and polyp removal in office procedure. Patient stated she does not want to be in pain or feel pain.  Patient asked if she was to get the pap and polyp removal in the operating room setting, how long will she be asleep for?  She stated she does not want to be asleep no longer then 5 minutes.  CMA informed patient that this message will be routed to Dr. Jeralyn and will call back patient.   Ermalinda GRADE CMA.

## 2023-03-17 ENCOUNTER — Telehealth (HOSPITAL_COMMUNITY): Payer: Self-pay

## 2023-03-17 LAB — URINE CULTURE: Culture: >=100000 — AB

## 2023-03-17 MED ORDER — SULFAMETHOXAZOLE-TRIMETHOPRIM 800-160 MG PO TABS: 1.0000 | ORAL_TABLET | Freq: Two times a day (BID) | ORAL | 0 refills | Status: AC

## 2023-03-17 NOTE — Telephone Encounter (Signed)
 Per protocol, pt to dc Macrobid and begin treatment with Bactrim.  Attempted to reach patient x1 using interpreter line. LVM.  Rx sent to pharmacy on file.

## 2023-03-17 NOTE — Telephone Encounter (Signed)
 Attempt to call patient back to inform on provider advising. No answer and LVM. Arabic Language Resource assist with the phone call.  Felecia Shelling CMA

## 2023-03-18 NOTE — Telephone Encounter (Signed)
 Attempt to call patient to inform on provider advising. No answer and LVM for patient to call back.  Language Resource Arabic interpreter assist with the call.  Felecia Shelling CMA

## 2023-04-07 ENCOUNTER — Ambulatory Visit: Payer: Medicaid Other | Admitting: Obstetrics and Gynecology

## 2023-04-12 ENCOUNTER — Encounter (HOSPITAL_COMMUNITY): Payer: Self-pay | Admitting: *Deleted

## 2023-04-12 ENCOUNTER — Inpatient Hospital Stay (HOSPITAL_COMMUNITY)
Admission: AD | Admit: 2023-04-12 | Discharge: 2023-04-12 | Disposition: A | Discharge status: Home / Self Care | Payer: Medicaid Other | Attending: Obstetrics and Gynecology | Admitting: Obstetrics and Gynecology

## 2023-04-12 DIAGNOSIS — Z3202 Encounter for pregnancy test, result negative: Secondary | ICD-10-CM | POA: Insufficient documentation

## 2023-04-12 DIAGNOSIS — N9489 Other specified conditions associated with female genital organs and menstrual cycle: Secondary | ICD-10-CM | POA: Diagnosis not present

## 2023-04-12 DIAGNOSIS — N939 Abnormal uterine and vaginal bleeding, unspecified: Secondary | ICD-10-CM | POA: Diagnosis not present

## 2023-04-12 LAB — COMPREHENSIVE METABOLIC PANEL
ALT: 14 U/L (ref 0–44)
AST: 18 U/L (ref 15–41)
Albumin: 3.8 g/dL (ref 3.5–5.0)
Alkaline Phosphatase: 33 U/L — ABNORMAL LOW (ref 38–126)
Anion gap: 10 (ref 5–15)
BUN: 5 mg/dL — ABNORMAL LOW (ref 6–20)
CO2: 24 mmol/L (ref 22–32)
Calcium: 9.1 mg/dL (ref 8.9–10.3)
Chloride: 106 mmol/L (ref 98–111)
Creatinine, Ser: 0.5 mg/dL (ref 0.44–1.00)
GFR, Estimated: >60 mL/min (ref >60)
Glucose, Bld: 99 mg/dL (ref 70–99)
Potassium: 3.5 mmol/L (ref 3.5–5.1)
Sodium: 140 mmol/L (ref 135–145)
Total Bilirubin: 0.6 mg/dL (ref 0.0–1.2)
Total Protein: 7.5 g/dL (ref 6.5–8.1)

## 2023-04-12 LAB — URINALYSIS, ROUTINE W REFLEX MICROSCOPIC: RBC / HPF: >50 RBC/hpf (ref 0–5)

## 2023-04-12 LAB — HCG, QUANTITATIVE, PREGNANCY: hCG, Beta Chain, Quant, S: 3 m[IU]/mL (ref <5)

## 2023-04-12 LAB — CBC
HCT: 38.5 % (ref 36.0–46.0)
Hemoglobin: 12.5 g/dL (ref 12.0–15.0)
MCH: 26 pg (ref 26.0–34.0)
MCHC: 32.5 g/dL (ref 30.0–36.0)
MCV: 80.2 fL (ref 80.0–100.0)
Platelets: 473 10*3/uL — ABNORMAL HIGH (ref 150–400)
RBC: 4.8 MIL/uL (ref 3.87–5.11)
RDW: 14 % (ref 11.5–15.5)
WBC: 6.2 10*3/uL (ref 4.0–10.5)
nRBC: 0 % (ref 0.0–0.2)

## 2023-04-12 LAB — POCT PREGNANCY, URINE: Preg Test, Ur: NEGATIVE

## 2023-04-12 MED ORDER — IBUPROFEN 600 MG PO TABS: 600.0000 mg | ORAL_TABLET | Freq: Four times a day (QID) | ORAL | 1 refills | Status: AC | PRN

## 2023-04-12 NOTE — MAU Note (Addendum)
.  Kelsey Ramirez is a 38 y.o. at  here in MAU reporting vag bleeding 04/04/23 which lasted about 5 days. Then started having bleeding Monday and came to MAU. Was told she was not pregnant. She went to [facility] and was told she was pregnant so the pt has returned here tonight. States she was told 2 days ago she has pneumonia and was given to injections and vag bleeding started after receiving those medications. Still having some vag bleeding today. Has changed pad every 1.5hrs today. Some mild abdominal cramping. Pt has positive upt from today from [facility] LMP: 03/10/23 Onset of complaint: yesterday Pain score: 2 Vitals:   04/12/23 2043  Pulse: 81  Resp: 17  Temp: 98 F (36.7 C)  SpO2: 100%     FHT: n/a  Lab orders placed from triage: upt

## 2023-04-12 NOTE — Progress Notes (Addendum)
Wynelle Bourgeois CNM in to talk with pt to discuss lab results and d/c plan. Video interpreter used for d/c. Written and verbal d/c instructions given and pt voiced. Understanding.

## 2023-04-12 NOTE — MAU Provider Note (Signed)
 Chief Complaint:  Vaginal Bleeding   Event Date/Time   First Provider Initiated Contact with Patient 04/12/23 2228      HPI: Lorain Fettes is a 38 y.o. [id number] who presents to maternity admissions reporting having had 5 days of bleeding last week with negative pregnancy test, then got a positive test at [facility] today.  Still has some vaginal bleeding and mild cramping. . She reports vaginal bleeding, vaginal itching/burning, urinary symptoms, h/a, dizziness, n/v, or fever/chills.    Vaginal Bleeding The patient's primary symptoms include pelvic pain and vaginal bleeding. The current episode started 1 to 4 weeks ago. The problem has been unchanged. The pain is mild. Pertinent negatives include no back pain, chills, nausea or vomiting. The vaginal discharge was bloody. The vaginal bleeding is lighter than menses. She has not been passing clots. She has not been passing tissue. Nothing aggravates the symptoms. She has tried nothing for the symptoms.   RN Note: Suraiya Dickerson is a 38 y.o. at  here in MAU reporting vag bleeding 04/04/23 which lasted about 5 days. Then started having bleeding Monday and came to MAU. Was told she was not pregnant. She went to [facility] and was told she was pregnant so the pt has returned here tonight. States she was told 2 days ago she has pneumonia and was given to injections and vag bleeding started after receiving those medications. Still having some vag bleeding today. Has changed pad every 1.5hrs today. Some mild abdominal cramping. Pt has positive upt from today from [facility] LMP: 03/10/23 Onset of complaint: yesterday  Past Medical History: 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    Gestational diabetes    Menorrhagia 06/19/2020   Nasal polyp     Past obstetric history: OB History  Gravida Para Term Preterm AB Living  5 3 3  1 3   SAB IAB Ectopic Multiple  Live Births  1   0 3    # Outcome Date GA Lbr Len/2nd Weight Sex Type Anes PTL Lv  5 Current           4 Term 04/02/21 [redacted]w[redacted]d  3095 g F CS-LTranv Spinal  LIV  3 Term 12/30/16 [redacted]w[redacted]d  3035 g M CS-LTranv EPI  LIV     Birth Comments: wnl  2 Term 09/10/15 [redacted]w[redacted]d  2700 g M CS-LTranv   LIV     Complications: Delivery by elective cesarean section  1 SAB             Obstetric Comments  G1: in [place]. Pt states was elective due to female circ.     Past Surgical History: Past Surgical History:  Procedure Laterality Date   CESAREAN SECTION     CESAREAN SECTION N/A 12/30/2016   Procedure: REPEAT CESAREAN SECTION;  Surgeon: Edsel Norleen GAILS, MD;  Location: [facility] BIRTHING SUITES;  Service: Obstetrics;  Laterality: N/A;   CESAREAN SECTION N/A 04/02/2021   Procedure: CESAREAN SECTION;  Surgeon: Herchel Gloris LABOR, MD;  Location: [facility] LD ORS;  Service: Obstetrics;  Laterality: N/A;    Family History: Family History  Problem Relation Age of Onset   Healthy Mother    Healthy Father    Cancer Neg Hx    Diabetes Neg Hx    Hypertension Neg Hx     Social History: Social History   Tobacco Use   Smoking  status: Never   Smokeless tobacco: Never  Vaping Use   Vaping status: Never Used  Substance Use Topics   Alcohol use: No   Drug use: No    Allergies: No Known Allergies  Meds:  Medications Prior to Admission  Medication Sig Dispense Refill Last Dose/Taking   folic acid  (FOLVITE ) 1 MG tablet Take 1 mg by mouth daily. (Patient not taking: Reported on 03/10/2023)      ibuprofen  (ADVIL ) 600 MG tablet Take 1 tablet (600 mg total) by mouth every 6 (six) hours as needed (pain). (Patient not taking: Reported on 03/10/2023) 40 tablet 0    miconazole  (MICATIN) 2 % cream Apply 1 Application topically 2 (two) times daily. (Patient not taking: Reported on 03/10/2023) 28.35 g 0    nitrofurantoin , macrocrystal-monohydrate, (MACROBID ) 100 MG capsule Take 1 capsule (100 mg total) by mouth 2 (two) times daily. 10  capsule 0    Norethindrone Acetate-Ethinyl Estrad-FE (LOESTRIN 24 FE) 1-20 MG-MCG(24) tablet Take 1 tablet by mouth daily. 28 tablet 12    phenazopyridine  (PYRIDIUM ) 200 MG tablet Take 1 tablet (200 mg total) by mouth 3 (three) times daily. 6 tablet 0     I have reviewed patient's Past Medical Hx, Surgical Hx, Family Hx, Social Hx, medications and allergies.  ROS:  Review of Systems  Constitutional:  Negative for chills.  Gastrointestinal:  Negative for nausea and vomiting.  Genitourinary:  Positive for pelvic pain and vaginal bleeding.  Musculoskeletal:  Negative for back pain.   Other systems negative     Physical Exam  Patient Vitals for the past 24 hrs:  Temp Pulse Resp SpO2 Height Weight  04/12/23 2043 98 F (36.7 C) 81 17 100 % 4' 11 (1.499 m) 51.3 kg   Constitutional: Well-developed, well-nourished female in no acute distress.  Cardiovascular: normal rate  Respiratory: normal effort, no distress. Productive cough.  GI: Abd soft, non-tender.  Nondistended.  No rebound, No guarding.   MS: Extremities nontender, no edema, normal ROM Neurologic: Alert and oriented x 4.   Grossly nonfocal. GU: Neg CVAT. Skin:  Warm and Dry Psych:  Affect appropriate.  PELVIC EXAM: deferred   Labs: Results for orders placed or performed during the hospital encounter of 04/12/23 (from the past 24 hours)  hCG, quantitative, pregnancy     Status: None   Collection Time: 04/12/23  9:14 PM  Result Value Ref Range   hCG, Beta Chain, Quant, S 3 <5 mIU/mL  CBC     Status: Abnormal   Collection Time: 04/12/23  9:14 PM  Result Value Ref Range   WBC 6.2 4.0 - 10.5 K/uL   RBC 4.80 3.87 - 5.11 MIL/uL   Hemoglobin 12.5 12.0 - 15.0 g/dL   HCT 61.4 63.9 - 53.9 %   MCV 80.2 80.0 - 100.0 fL   MCH 26.0 26.0 - 34.0 pg   MCHC 32.5 30.0 - 36.0 g/dL   RDW 85.9 88.4 - 84.4 %   Platelets 473 (H) 150 - 400 K/uL   nRBC 0.0 0.0 - 0.2 %  Comprehensive metabolic panel     Status: Abnormal   Collection  Time: 04/12/23  9:14 PM  Result Value Ref Range   Sodium 140 135 - 145 mmol/L   Potassium 3.5 3.5 - 5.1 mmol/L   Chloride 106 98 - 111 mmol/L   CO2 24 22 - 32 mmol/L   Glucose, Bld 99 70 - 99 mg/dL   BUN 5 (L) 6 - 20 mg/dL   Creatinine,  Ser 0.50 0.44 - 1.00 mg/dL   Calcium  9.1 8.9 - 10.3 mg/dL   Total Protein 7.5 6.5 - 8.1 g/dL   Albumin 3.8 3.5 - 5.0 g/dL   AST 18 15 - 41 U/L   ALT 14 0 - 44 U/L   Alkaline Phosphatase 33 (L) 38 - 126 U/L   Total Bilirubin 0.6 0.0 - 1.2 mg/dL   GFR, Estimated >39 >39 mL/min   Anion gap 10 5 - 15  Urinalysis, Routine w reflex microscopic -Urine, Clean Catch     Status: Abnormal   Collection Time: 04/12/23  9:54 PM  Result Value Ref Range   Color, Urine RED (A) YELLOW   APPearance TURBID (A) CLEAR   Specific Gravity, Urine  1.005 - 1.030    TEST NOT REPORTED DUE TO COLOR INTERFERENCE OF URINE PIGMENT   pH  5.0 - 8.0    TEST NOT REPORTED DUE TO COLOR INTERFERENCE OF URINE PIGMENT   Glucose, UA (A) NEGATIVE mg/dL    TEST NOT REPORTED DUE TO COLOR INTERFERENCE OF URINE PIGMENT   Hgb urine dipstick (A) NEGATIVE    TEST NOT REPORTED DUE TO COLOR INTERFERENCE OF URINE PIGMENT   Bilirubin Urine (A) NEGATIVE    TEST NOT REPORTED DUE TO COLOR INTERFERENCE OF URINE PIGMENT   Ketones, ur (A) NEGATIVE mg/dL    TEST NOT REPORTED DUE TO COLOR INTERFERENCE OF URINE PIGMENT   Protein, ur (A) NEGATIVE mg/dL    TEST NOT REPORTED DUE TO COLOR INTERFERENCE OF URINE PIGMENT   Nitrite (A) NEGATIVE    TEST NOT REPORTED DUE TO COLOR INTERFERENCE OF URINE PIGMENT   Leukocytes,Ua (A) NEGATIVE    TEST NOT REPORTED DUE TO COLOR INTERFERENCE OF URINE PIGMENT   RBC / HPF >50 0 - 5 RBC/hpf   WBC, UA 0-5 0 - 5 WBC/hpf   Bacteria, UA RARE (A) NONE SEEN   Squamous Epithelial / HPF 0-5 0 - 5 /HPF  Pregnancy, urine POC     Status: None   Collection Time: 04/12/23  9:56 PM  Result Value Ref Range   Preg Test, Ur NEGATIVE NEGATIVE      Imaging:  No results found.  MAU  Course/MDM: I have reviewed the triage vital signs and the nursing notes.   Pertinent labs & imaging results that were available during my care of the patient were reviewed by me and considered in my medical decision making (see chart for details).      I have reviewed her medical records including past results, notes and treatments.   I have ordered labs as follows: see above.  HCG is 3.  Explained this means it is negative. Imaging ordered: none Results reviewed.    Treatments in MAU included none  Discussed it is unclear whether she was pregnant or not, since we don't have any HCG levels from before.  Recommend ibuprofen  to help diminish bleeding and cramps.   She is to call her Doctor tomorrow. To update them.   Pt stable at time of discharge.  Assessment: Negative serum pregnancy test Bleeding, likely menses Cramping ?false positive test at Urgent care.  Plan: Discharge home Recommend Call OB/GYN in am Rx sent for ibuprofen  for cramping and bleeding   Encouraged to return here or to other Urgent Care/ED if she develops worsening of symptoms, increase in pain, fever, or other concerning symptoms.   Earnie Pouch CNM, MSN Certified Nurse-Midwife 04/12/2023 10:28 PM

## 2023-04-20 ENCOUNTER — Ambulatory Visit: Payer: Medicaid Other | Admitting: Obstetrics and Gynecology

## 2023-04-27 ENCOUNTER — Ambulatory Visit: Payer: Medicaid Other | Admitting: Obstetrics and Gynecology

## 2023-05-04 ENCOUNTER — Encounter: Payer: Self-pay | Admitting: Obstetrics and Gynecology

## 2023-05-04 ENCOUNTER — Other Ambulatory Visit: Payer: Self-pay

## 2023-05-04 ENCOUNTER — Ambulatory Visit: Payer: Medicaid Other | Admitting: Obstetrics and Gynecology

## 2023-05-04 ENCOUNTER — Other Ambulatory Visit (HOSPITAL_COMMUNITY)
Admission: RE | Admit: 2023-05-04 | Discharge: 2023-05-04 | Disposition: A | Discharge status: Home / Self Care | Payer: Medicaid Other | Source: Ambulatory Visit | Attending: Obstetrics and Gynecology | Admitting: Obstetrics and Gynecology

## 2023-05-04 VITALS — BP 107/72 | HR 88 | Wt 114.3 lb

## 2023-05-04 DIAGNOSIS — Z603 Acculturation difficulty: Secondary | ICD-10-CM

## 2023-05-04 DIAGNOSIS — Z758 Other problems related to medical facilities and other health care: Secondary | ICD-10-CM

## 2023-05-04 DIAGNOSIS — Z3009 Encounter for other general counseling and advice on contraception: Secondary | ICD-10-CM | POA: Diagnosis not present

## 2023-05-04 DIAGNOSIS — N939 Abnormal uterine and vaginal bleeding, unspecified: Secondary | ICD-10-CM | POA: Diagnosis not present

## 2023-05-04 MED ORDER — NORGESTIMATE-ETH ESTRADIOL 0.18/0.215/0.25 MG-25 MCG PO TABS: 1.0000 | ORAL_TABLET | Freq: Every day | ORAL | 11 refills | Status: AC

## 2023-05-04 NOTE — Progress Notes (Signed)
 GYNECOLOGY VISIT  Patient name: Ardyn Forge MRN [id number]  Date of birth: August 10, 1985 Chief Complaint:   Gynecologic Exam   History:  Americus Brianne Maina is a 38 y.o. [id number] being seen today for follow up. A month ago prior to her menses, she passed a large blood clot and after that had heavy bleeding for about 9 days and then she presented to the emergency room and was told that she was not pregnant, sent to non-pregnant ED and was not seeen due to it being crowded, then resented to urgent care and pregnancy test was positive and then went to the [facility] (had to interrupt). Since leaving the hospital had period for 3 weeks.     Never took the birth control previously prescribed, wants to take a different birth control that her other friends/family have taken   Has not had sex since [facility] visit  Patient is requesting an ultrasound  Did not bring pill for pelvic exam  Menses are 26-27 d and going on 31 days, missed cycle   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    Gestational diabetes    Menorrhagia 06/19/2020   Nasal polyp     Past Surgical History:  Procedure Laterality Date   CESAREAN SECTION     CESAREAN SECTION N/A 12/30/2016   Procedure: REPEAT CESAREAN SECTION;  Surgeon: Tilda Burrow, MD;  Location: [facility] BIRTHING SUITES;  Service: Obstetrics;  Laterality: N/A;   CESAREAN SECTION N/A 04/02/2021   Procedure: CESAREAN SECTION;  Surgeon: Tereso Newcomer, MD;  Location: [facility] LD ORS;  Service: Obstetrics;  Laterality: N/A;    The following portions of the patient's history were reviewed and updated as appropriate: allergies, current medications, past family history, past medical history, past social history, past surgical history and problem list.   Health Maintenance:   Last pap No results found for: "DIAGPAP", "HPVHIGH", "ADEQPAP" Last mammogram: n/a   Review of Systems:   Pertinent items are noted in HPI. Comprehensive review of systems was otherwise negative.   Objective:  Physical Exam BP 107/72   Pulse 88   Wt 114 lb 4.8 oz (51.8 kg)   LMP 03/10/2023   Breastfeeding Unknown   BMI 23.09 kg/m    Physical Exam Vitals and nursing note reviewed.  Constitutional:      Appearance: Normal appearance.  HENT:     Head: Normocephalic and atraumatic.  Pulmonary:     Effort: Pulmonary effort is normal.  Skin:    General: Skin is warm and dry.  Neurological:     General: No focal deficit present.     Mental Status: She is alert.  Psychiatric:        Mood and Affect: Mood normal.        Behavior: Behavior normal.        Thought Content: Thought content normal.        Judgment: Judgment normal.        Assessment & Plan:   1. Vaginal bleeding (Primary) 2. Abnormal uterine bleeding (AUB) Repeat pelvic ultrasound ordered, noted there may not be significant difference from prior ultrasound and if cervical lesion present, may not visible on ultrasound.  - US PELVIC COMPLETE WITH TRANSVAGINAL; Future - Norgestimate-Eth Estradiol (TRI-LO-MARZIA) 0.18/0.215/0.25 MG-25 MCG TABS; Take 1 tablet by mouth daily.  Dispense: 28 tablet; Refill: 11  3. Encounter for counseling regarding  contraception No CHC contraindications, will initiate OCPs. The use of the oral contraceptive has been fully discussed with the patient. This includes the proper method to initiate (i.e. Sunday start after next normal menstrual onset versus same day start) and continue the pills, the need for regular compliance to ensure adequate contraceptive effect, the physiology which make the pill effective, the instructions for what to do in event of a missed pill, and warnings about anticipated minor side effects such as breakthrough spotting, nausea, breast tenderness, weight changes, acne, headaches, etc.  They have been told of the more serious potential side effects such as MI, stroke, and  deep vein thrombosis, all of which are very unlikely.  They have been asked to report any signs of such serious problems immediately.  They should back up the pill with a condom during any cycle in which antibiotics are prescribed, and during the first cycle as well. The need for additional protection, such as a condom, to prevent exposure to sexually transmitted diseases has also been discussed- the patient has been clearly reminded that OCP's cannot protect them against diseases such as HIV and others. They understand and wish to take the medication as prescribed.  - Norgestimate-Eth Estradiol (TRI-LO-MARZIA) 0.18/0.215/0.25 MG-25 MCG TABS; Take 1 tablet by mouth daily.  Dispense: 28 tablet; Refill: 11 - Cervicovaginal ancillary only  4. Language barrier affecting health care In person interpreter used for encounter   Routine preventative health maintenance measures emphasized.  Lorriane Shire, MD [facility] for [facility], [facility]

## 2023-05-05 ENCOUNTER — Encounter: Payer: Self-pay | Admitting: Obstetrics and Gynecology

## 2023-05-05 ENCOUNTER — Other Ambulatory Visit: Payer: Self-pay | Admitting: Obstetrics and Gynecology

## 2023-05-05 DIAGNOSIS — B3731 Acute candidiasis of vulva and vagina: Secondary | ICD-10-CM

## 2023-05-05 DIAGNOSIS — N760 Acute vaginitis: Secondary | ICD-10-CM

## 2023-05-05 LAB — CERVICOVAGINAL ANCILLARY ONLY
Bacterial Vaginitis (gardnerella): POSITIVE — AB
Candida Glabrata: NEGATIVE
Candida Vaginitis: POSITIVE — AB
Chlamydia: NEGATIVE
Comment: NEGATIVE
Comment: NEGATIVE
Comment: NEGATIVE
Comment: NEGATIVE
Comment: NEGATIVE
Comment: NORMAL
Neisseria Gonorrhea: NEGATIVE
Trichomonas: NEGATIVE

## 2023-05-05 MED ORDER — METRONIDAZOLE 500 MG PO TABS: 500.0000 mg | ORAL_TABLET | Freq: Two times a day (BID) | ORAL | 0 refills | Status: AC

## 2023-05-05 MED ORDER — FLUCONAZOLE 150 MG PO TABS: 150.0000 mg | ORAL_TABLET | Freq: Once | ORAL | 1 refills | Status: AC

## 2023-05-06 ENCOUNTER — Ambulatory Visit (HOSPITAL_COMMUNITY)
Admission: RE | Admit: 2023-05-06 | Discharge: 2023-05-06 | Disposition: A | Discharge status: Home / Self Care | Payer: Medicaid Other | Source: Ambulatory Visit | Attending: Obstetrics and Gynecology | Admitting: Obstetrics and Gynecology

## 2023-05-06 DIAGNOSIS — N939 Abnormal uterine and vaginal bleeding, unspecified: Secondary | ICD-10-CM | POA: Insufficient documentation

## 2023-05-16 ENCOUNTER — Other Ambulatory Visit: Payer: Self-pay

## 2023-05-16 ENCOUNTER — Ambulatory Visit (INDEPENDENT_AMBULATORY_CARE_PROVIDER_SITE_OTHER): Payer: Medicaid Other | Admitting: Obstetrics and Gynecology

## 2023-05-16 VITALS — BP 118/77 | HR 92 | Wt 115.0 lb

## 2023-05-16 DIAGNOSIS — Z758 Other problems related to medical facilities and other health care: Secondary | ICD-10-CM

## 2023-05-16 DIAGNOSIS — Z603 Acculturation difficulty: Secondary | ICD-10-CM | POA: Diagnosis not present

## 2023-05-16 DIAGNOSIS — N970 Female infertility associated with anovulation: Secondary | ICD-10-CM | POA: Diagnosis not present

## 2023-05-16 MED ORDER — LETROZOLE 2.5 MG PO TABS: 2.5000 mg | ORAL_TABLET | Freq: Every day | ORAL | 2 refills | Status: AC

## 2023-05-16 NOTE — Progress Notes (Unsigned)
 GYNECOLOGY VISIT  Patient name: Kelsey Ramirez MRN [id number]  Date of birth: 27-Dec-1985 Chief Complaint:   Follow-up ([place] f/u pt requesting --prenatal vitamins)   History:  Kelsey Ramirez is a 38 y.o. [phone] being seen today for follow up.  Repots she think she may have had a SAB. Most recent mense lasted 7 days. Feels she had period symptom. Notes on day 10 since as beeld and having spotting. States that after "SAB" menses was returne dto 6-7 days and now having just streaks/spotting after 7 days of menses, and feels that it is back to normal. After last baby had mense for 12-13   No states that now that her menses are normal she wants to get pregnat  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    Gestational diabetes    Menorrhagia 06/19/2020   Nasal polyp     Past Surgical History:  Procedure Laterality Date   CESAREAN SECTION     CESAREAN SECTION N/A 12/30/2016   Procedure: REPEAT CESAREAN SECTION;  Surgeon: Tilda Burrow, MD;  Location: [facility] BIRTHING SUITES;  Service: Obstetrics;  Laterality: N/A;   CESAREAN SECTION N/A 04/02/2021   Procedure: CESAREAN SECTION;  Surgeon: Tereso Newcomer, MD;  Location: [facility] LD [facility]S;  Service: Obstetrics;  Laterality: N/A;    The following portions of the patient's history were reviewed and updated as appropriate: allergies, current medications, past family history, past medical history, past social history, past surgical history and problem list.   Health Maintenance:   Last pap No results found for: "DIAGPAP", "HPVHIGH", "ADEQPAP"  High Risk HPV: Positive  Adequacy:  Satisfactory for evaluation, transformation zone component PRESENT  Diagnosis:  Atypical squamous cells of undetermined significance (ASC-US)  Last mammogram: n/a   Review of Systems:  Pertinent items are noted in HPI. Comprehensive review of systems was otherwise  negative.   Objective:  Physical Exam BP 118/77 (BP Location: Left Arm, Patient Position: Sitting, Cuff Size: Normal)   Pulse 92   Wt 115 lb (52.2 kg)   LMP 05/07/2023   SpO2 98%   Breastfeeding No   BMI 23.23 kg/m    Physical Exam Vitals and nursing note reviewed.  Constitutional:      Appearance: Normal appearance.  HENT:     Head: Normocephalic and atraumatic.  Pulmonary:     Effort: Pulmonary effort is normal.  Skin:    General: Skin is warm and dry.  Neurological:     General: No focal deficit present.     Mental Status: She is alert.  Psychiatric:        Mood and Affect: Mood normal.        Behavior: Behavior normal.        Thought Content: Thought content normal.        Judgment: Judgment normal.      Labs and Imaging US PELVIC COMPLETE WITH TRANSVAGINAL Result Date: 05/16/2023 CLINICAL DATA:  Abnormal uterine bleeding, recent miscarriage 04/04/2023 EXAM: TRANSABDOMINAL AND TRANSVAGINAL ULTRASOUND OF PELVIS TECHNIQUE: Both transabdominal and transvaginal ultrasound examinations of the pelvis were performed. Transabdominal technique was performed for global imaging of the pelvis including uterus, ovaries, adnexal regions, and pelvic cul-de-sac. It was necessary to proceed with endovaginal exam following the transabdominal exam to visualize the uterus endometrium adnexa. COMPARISON:  Ultrasound 04/21/2022 FINDINGS: Uterus Measurements: 8.1 x 4.5 x 5.8 cm =  volume: 112.4 mL. No fibroids or other mass visualized. Endometrium Thickness: 7.8 mm.  No focal abnormality visualized. Right ovary Measurements: 2 x 0.8 x 2.4 cm = volume: 2.1 mL. Normal appearance/no adnexal mass. Left ovary Measurements: 2.3 x 2.1 x 1.6 cm = volume: 3.9 mL. Normal appearance/no adnexal mass. Other findings No abnormal free fluid. IMPRESSION: Negative pelvic ultrasound. No evidence for retained products of conception Electronically Signed   By: Jasmine Pang M.D.   On: 05/16/2023 15:51        Assessment & Plan:   1. Female infertility associated with anovulation (Primary) Patient requesting medication to help with conception, trial of letrozole - Progesterone; Future - letrozole (FEMARA) 2.5 MG tablet; Take 1 tablet (2.5 mg total) by mouth daily. Take on days 3 to 7 following a spontaneous menses or progestin-induced bleed.  Dispense: 5 tablet; Refill: 2  2. Language barrier affecting health care Video interpreter used for encounter  Routine preventative health maintenance measures emphasized.  Lorriane Shire, MD [facility] for [facility], [facility]

## 2023-05-25 ENCOUNTER — Encounter (HOSPITAL_BASED_OUTPATIENT_CLINIC_OR_DEPARTMENT_OTHER): Payer: Self-pay

## 2023-05-25 ENCOUNTER — Other Ambulatory Visit: Payer: Self-pay

## 2023-05-25 ENCOUNTER — Emergency Department (HOSPITAL_BASED_OUTPATIENT_CLINIC_OR_DEPARTMENT_OTHER)
Admission: EM | Admit: 2023-05-25 | Discharge: 2023-05-25 | Disposition: A | Discharge status: Home / Self Care | Attending: Student | Admitting: Student

## 2023-05-25 DIAGNOSIS — X58XXXA Exposure to other specified factors, initial encounter: Secondary | ICD-10-CM | POA: Insufficient documentation

## 2023-05-25 DIAGNOSIS — S60011A Contusion of right thumb without damage to nail, initial encounter: Secondary | ICD-10-CM | POA: Diagnosis not present

## 2023-05-25 DIAGNOSIS — M79641 Pain in right hand: Secondary | ICD-10-CM

## 2023-05-25 DIAGNOSIS — M79644 Pain in right finger(s): Secondary | ICD-10-CM | POA: Diagnosis present

## 2023-05-25 NOTE — Discharge Instructions (Addendum)
 You have been seen today for your complaint of right hand pain and swelling. Home care instructions are as follows:  Follow-up with your primary care provider to obtain a CT in the future Please seek immediate medical care if you develop any of the following symptoms: You lose feeling in your arm or hand. You cannot move your fingers. Your fingers turn a dark color. At this time there does not appear to be the presence of an emergent medical condition, however there is always the potential for conditions to change. Please read and follow the below instructions.  Do not take your medicine if  develop an itchy rash, swelling in your mouth or lips, or difficulty breathing; call 911 and seek immediate emergency medical attention if this occurs.  You may review your lab tests and imaging results in their entirety on your MyChart account.  Please discuss all results of fully with your primary care provider and other specialist at your follow-up visit.  Note: Portions of this text may have been transcribed using voice recognition software. Every effort was made to ensure accuracy; however, inadvertent computerized transcription errors may still be present.

## 2023-05-25 NOTE — ED Triage Notes (Signed)
 In for eval of right thumb pain and swelling onset Monday. Was seen by PCP and had bloodwork and ultrasound. Numbness to right arm.

## 2023-05-25 NOTE — ED Provider Notes (Signed)
 [facility] EMERGENCY DEPARTMENT AT [facility] Provider Note   CSN: [id number] Arrival date & time: 05/25/23  0757     History  Chief Complaint  Patient presents with   Arm Numbness    Kelsey Ramirez is a 38 y.o. female.  Presenting to the ED for evaluation of right thumb pain and swelling.  3 days ago she noticed that the thumb was swollen, darker colored and painful.  Shortly after she noticed this, she states that her right hand went numb.  She went to her primary care provider and had an ultrasound ordered which showed decreased flow velocity of the right upper extremity suggesting possible proximal stenosis and recommended a CTA of the aortic arch and subclavian artery.  She states that her symptoms have resolved.  She no longer has any pain or swelling.  No numbness of the right upper extremity.  She denies any traumatic injuries.  History is provided by the patient and husband at bedside with use of an Arabic interpreter.  HPI     Home Medications Prior to Admission medications   Medication Sig Start Date End Date Taking? Authorizing Provider  ACCRUFER 30 MG CAPS Take by mouth. 05/24/23  Yes [provider]  Vitamin D, Ergocalciferol, (DRISDOL) 1.25 MG (50000 UNIT) CAPS capsule Take 50,000 Units by mouth once a week. 05/17/23  Yes [provider]  folic acid (FOLVITE) 1 MG tablet Take 1 mg by mouth daily. Patient not taking: Reported on 03/10/2023 03/27/21   [provider]  ibuprofen (ADVIL) 600 MG tablet Take 1 tablet (600 mg total) by mouth every 6 (six) hours as needed. Patient not taking: Reported on 05/16/2023 04/12/23   Aviva Signs, CNM  letrozole [facility]) 2.5 MG tablet Take 1 tablet (2.5 mg total) by mouth daily. Take on days 3 to 7 following a spontaneous menses or progestin-induced bleed. 05/16/23   Lorriane Shire, MD  miconazole (MICATIN) 2 % cream Apply 1 Application topically 2 (two) times daily. Patient not  taking: Reported on 03/10/2023 01/31/23   Richardson Landry, CNM  nitrofurantoin, macrocrystal-monohydrate, (MACROBID) 100 MG capsule Take 1 capsule (100 mg total) by mouth 2 (two) times daily. 03/14/23   Blitch, Linde Gillis, NP  Norgestimate-Eth Estradiol (TRI-LO-MARZIA) 0.18/0.215/0.25 MG-25 MCG TABS Take 1 tablet by mouth daily. Patient not taking: Reported on 05/16/2023 05/04/23   Lorriane Shire, MD  phenazopyridine (PYRIDIUM) 200 MG tablet Take 1 tablet (200 mg total) by mouth 3 (three) times daily. Patient not taking: Reported on 05/16/2023 03/14/23   Nelda Marseille, NP      Allergies    Patient has no known allergies.    Review of Systems   Review of Systems  Musculoskeletal:  Positive for arthralgias and joint swelling.  All other systems reviewed and are negative.   Physical Exam Updated Vital Signs BP 111/74 (BP Location: Left Arm)   Pulse 92   Temp 98.4 F (36.9 C)   Resp 16   Ht 5' (1.524 m)   Wt 53 kg   LMP 05/07/2023 (Exact Date)   SpO2 100%   BMI 22.82 kg/m  Physical Exam Vitals and nursing note reviewed.  Constitutional:      General: She is not in acute distress.    Appearance: Normal appearance. She is normal weight. She is not ill-appearing.  HENT:     Head: Normocephalic and atraumatic.  Pulmonary:     Effort: Pulmonary effort is normal. No respiratory distress.  Abdominal:  General: Abdomen is flat.  Musculoskeletal:        General: Normal range of motion.     Cervical back: Neck supple.     Comments: Mild bruising of the right dorsal thumb.  Full AROM.  No tenderness to palpation.  No anatomical snuffbox tenderness.  Radial pulse 2+ bilaterally.  Sensation intact distally.  Capillary refill normal.  Grip strength 5 out of 5 bilaterally.  Skin:    General: Skin is warm and dry.  Neurological:     Mental Status: She is alert and oriented to person, place, and time.  Psychiatric:        Mood and Affect: Mood normal.        Behavior: Behavior  normal.     ED Results / Procedures / Treatments   Labs (all labs ordered are listed, but only abnormal results are displayed) Labs Reviewed - No data to display  EKG None  Radiology No results found.  Procedures Procedures    Medications Ordered in ED Medications - No data to display  ED Course/ Medical Decision Making/ A&P                                 Medical Decision Making This patient presents to the ED for concern of right thumb pain, this involves an extensive number of treatment options, and is a complaint that carries with it a high risk of complications and morbidity.  The differential diagnosis includes proximal arterial stenosis, ischemia, fracture, strain, sprain, contusion  My initial workup includes  Additional history obtained from: Nursing notes from this visit. Previous records from her PCP at [facility] revealing RUE arterial ultrasound which showed decreased flow velocity of the right upper extremity suggesting possible proximal stenosis with recommendation for CT angiogram of the aortic arch and right subclavian artery Husband at bedside provides portion of the history  Afebrile, hemodynamically stable.  38 year old female presenting to the ED for evaluation of right thumb pain and swelling that occurred 3 days ago.  Symptoms have resolved.  Had an ultrasound yesterday which showed decreased arterial flow, there is some concern for proximal stenosis.  There is recommendation to obtain CTA to further assess.  Patient states that she recently had a CT of her abdomen for renal colic and does not want another CT at this time.  On exam, her neurovascular status is intact.  Her hand is moderately cold, however she has 2+ radial pulse, normal capillary refill, normal sensation, normal strength.  Believe this CT scan can be done as an outpatient.  Question if she has thoracic outlet syndrome.  She was encouraged to follow-up with her PCP.  She was given  return precautions.  Stable at discharge.  At this time there does not appear to be any evidence of an acute emergency medical condition and the patient appears stable for discharge with appropriate outpatient follow up. Diagnosis was discussed with patient who verbalizes understanding of care plan and is agreeable to discharge. I have discussed return precautions with patient and husband at bedside who verbalizes understanding. Patient encouraged to follow-up with their PCP within 1 week. All questions answered.  Patient's case discussed with Dr. Posey Rea who agrees with plan to discharge with follow-up.   Note: Portions of this report may have been transcribed using voice recognition software. Every effort was made to ensure accuracy; however, inadvertent computerized transcription errors may still be present.  Final Clinical Impression(s) / ED Diagnoses Final diagnoses:  Right hand pain    Rx / DC Orders ED Discharge Orders     None         Michelle Piper, Cordelia Poche 05/25/23 1003    Kommor, Wyn Forster, MD 05/25/23 1842

## 2023-05-26 ENCOUNTER — Other Ambulatory Visit

## 2023-05-26 DIAGNOSIS — N970 Female infertility associated with anovulation: Secondary | ICD-10-CM

## 2023-05-27 LAB — PROGESTERONE: Progesterone: 8.2 ng/mL

## 2023-06-01 ENCOUNTER — Telehealth: Payer: Self-pay

## 2023-06-01 NOTE — Telephone Encounter (Addendum)
-----   Message from Lorriane Shire sent at 05/31/2023  1:23 PM EDT ----- Notify patient that  progesterone indicates recent ovulation, assuming it ws completed around day 21 of cycle. Recommend timing intercourse around anticipated ovulation for conception  Left message with [facility] Interpreter # [phone] to return call back to office in regards to results.   Leonette Nutting

## 2023-06-08 NOTE — Telephone Encounter (Signed)
 Attempted to contact unable to reach.  Message sent via MyChart.    Kelsey Ramirez

## 2023-07-28 ENCOUNTER — Other Ambulatory Visit: Payer: Self-pay

## 2023-07-28 ENCOUNTER — Other Ambulatory Visit (HOSPITAL_COMMUNITY)
Admission: RE | Admit: 2023-07-28 | Discharge: 2023-07-28 | Disposition: A | Discharge status: Home / Self Care | Source: Ambulatory Visit | Attending: Family Medicine | Admitting: Family Medicine

## 2023-07-28 ENCOUNTER — Ambulatory Visit

## 2023-07-28 VITALS — BP 106/71 | HR 77 | Ht 60.0 in | Wt 114.4 lb

## 2023-07-28 DIAGNOSIS — N898 Other specified noninflammatory disorders of vagina: Secondary | ICD-10-CM | POA: Insufficient documentation

## 2023-07-28 NOTE — Patient Instructions (Addendum)
 [facility] [phone] to f/u with infertility appointment in two weeks if have not heard back.    Water -soluble lubrication,   Monistat  cream, hydocortisone     ?????? ?????? ???????? Bacterial Vaginosis  ?????? ?????? ???????? ???? ???? ???? ?????? ??????? ??????? ???????? ???????? ?? ??????. ???? ?????? ???? ???? ??? ??? ??????? ????? ?? ??????. ??????? ?????? ???????? ???? ????? ?????? ???? ???? ?????? ?????? ??? ?????? ?? ?? 15 ??? 44 ?????. ???? ??? ?????? ?? ???????? ?????? ?????? ???? ????? ?? ???? ??????? ?????? (STIs). ????? ?? ????? ??????? ?? ????? ??? ?????. ?? ?????? ??? ???? ?????? ??????? ??? ??? ?????? ?? ????? ?? ????? ??????? ??? ???? ??? ????? ?? ?? ??????? ???? ??? ?? ???????. ?? ????? ??? ??????? ???? ?????? ?????? ???????? ????? ?????? ????? ???????? ?????? ????? ????? ?? ?????? ?????? ???? ???? ??????. ??? ??? ??? ????? ?????? ??????? ???? ?????? ??? ?????. ?????? ?????? ???????? ?? ????? ???? ??? ????? ??????? ?? ????? ?????? ?? ?????? ??????? ?? ?????? ??????? ??????? ??. ?? ????? ????? ?????? ???? ????? ??????? ??????? ???? ???? ??????? ???? ??????:  ??? ?????? ????? ?? ??? ???? ?? ?? ???? ?? ??? ?? ?????? ????? ??? ????? ???????.  ??????? ?? ?????.  ???? ?????? ?????? (IUD).  ???????.  ??? ????? ???????? ?????????? ????????. ???? ???? ?? ????? ??? ??????? ????? ???? ?????.  ????? ??? ????? ???????? ???????.  ?????. ?? ?????? ??? ?????? ?? ???????? ??? ?????? ?????? ???? ???? ?????? ?? ???? ????? ????? ???????. ????? ???? ??????? ?? ???:  ??????? ?????? ?????? ?? ?????. ???? ????????? ?? ???? ????? ?? ???? ??????.  ??????? ??????? ???? ????? ?????? ????? ??? ?????? ????? ?? ?? ????? ?????? ???????.  ??? ???? ???? ??????.  ?????? ?????? ?? ??? ??? ??????. ??? ????? ??? ??????? ????? ??? ?????? ???????? ???:  ??????? ?????.  ??? ???? ??????.  ??? ???? ?? ?????? ??????? ??????? ???? ??????? ???? ?? ????? ??? ??????. ??? ?????? ???  ??????? ?????? ??? ?????? ??????? ?????. ?????? ??? ??????? ?? ???? ?????? ?? ???? ????? ?? ????? ???? ????? ?? ??????? (??????). ?? ??? ????? ??????? ???? ????? ?? ?????? ????????? ??????? ?? ???? ?????? ?????? ??? ???????. ????? ????????? ??????? ?? ???????: ???????  ?????? ?? ??????? ??????? ???? ????? ????? ???? ?? ??? ???? ???? ??? ??????? ???? ??????? ?????? ????? ???.  ?????? ?? ???? ???????? ??????? ??? ??????? ???? ??????? ??????. ?? ?????? ?? ????? ?? ??????? ?????? ?????? ??? ??? ????? ??????? ??????? ???? ??? ?? ???? ????? ?? ??????. ??????? ????  ??? ??? ????? ????? ????? ?? ??????? ??????? ??? ?????? ?? ???? ??????. ??????? ???? ??? ?? ????? ?????? ?? ??????? ??????? ?????? ??????? ???. ??? ??? ??? ???? ???? ???? ?? ???????? ???? ??? ????? ??? ??????.  ????? ?????? ?????? ??? ??? ?????? ???????.  ????? ????? ????? ?? ??????? ???? ??? ????? ???? ??????.  ????? ??? ????? ????? ??????? ??????? ??????? ?????????. ? ????? ??? ??????? ?????? ?????? ??????. ? ????? ????? ?? ?????? ????? ??? ??????? ????????.  ??? ???? ?????? ????? ??????? ???????? ??????? ??????? ?????? ??????? ??? ????? ?????? ??????? ???????? ?? ????? ???????.  ?????? ????? ?????? ????????. ???? ??? ???. ??? ????? ??????? ?? ???? ??????? ??????  ?? ??????? ???? ???????.  ????? ??????? ???????? ?? ??????? ???? ???? ???.  ????? ??? ?????? ????? ?????? ?? ????? ?? ??????? ???????.  ????? ?????? ???????? ?????? ???????? ??????? ??????? ??????? ?? ????? ??????. ???????? ??????? ??? ?????? ?????  ??????? ??? ??? ?????? ?????. ????? ??? ?????: ? ??????? ?????? ??????. ? ??????? ???? ???????. ??? ???? ???? ?? ???? ?????? ?? ??????? ?? ?????? ??????? ???? ???? ?? ????? ?????? ????? ??????.  ???? ??? ?? ??????? ???? ?????. ????????? ?? ??????? ?? ?????? ?????? ????????? ???? ???? ????? ?? ?????? ????? ?? ??? ???? ??? (??? ???? ????????).  ????? ?? ?????? ????? ?????? ?????? ????????? ??????? ???????? ??? ????? (  STIs). ?????  ???????? ?????????? ????????  ?? ?????? ?????? ????? ??? ????????? ?? ?????. ????? ??? ???????? ??????? ??????? ?? ??????????? ?? ??? ?????. ??????? ???? ??????? ?????? ??? ????? ??? ???????? ??????? ?? ???????.  ?? ????? ????????.  ?? ?????? ????????? ??????? ?? ??????? ???????: ? ????? ???? ??????? ?????? ??????? ??? ????????? ?? ???. ? ??? ???? ??????? ?? ?????? ?????? ?? ??????? ????? ?? ?????? ??????.  ??????? ?????? ????????? ????????: ?  ?????? ?? ????? ??? ????????? ?????? ?? ?????? ??????? ?????? ??? ??????.  ? ?????? ??? ???? ?????? ?? ??????. ?? ???????? ???????? ????? ??????? ?????? ???? ??? 12 ????? (355 ??) ?? ?????? ?? ??? ??? 5 ?????? (148 ??) ?? ?????? ?? ??? ??? ????? ???? (44 ??) ?? ????????? ???????? ??????. ????? ?????? ??? ?????? ?? ?????????:  ????? ?????? ??????? ???????? (Centers for Disease Control and Prevention): [email or web address]  ??????? ???????? ????? ??????? Designer, jewellery) (American Sexual Health Association)?: [email or web address]  ???? ??? ?????? ?????? ????? ???????? ????????? ????????? ?([place] Department of Health and Cytogeneticist on Women's Health)??: [email or web address] ????? ????? ??????? ?????? ?? ??????? ???????:  ??? ?? ????? ??????? ???? ?????? ???? ??? ??? ??????.  ????? ?? ????????? ?? ?????? ??? ??????.  ??????? ???? ?? ?????.  ?????? ???? ?? ????? ?? ?????.  ?????? ???? ?? ????? ???????? ??????.  ??? ????? ?? ??? ??? ?????? ???????. ????  ?????? ?????? ???????? ???? ???? ?????? ???? ?????? ??????? ??????? ???????? ???????? ???. ????? ??? ?? ??? ??? ??? ???? ?? ????????.  ???? ??? ?????? ?? ???????? ?????? ?????? ???? ????? ?? ???? ??????? ?????? (STIs). ????? ?? ????? ?????? ?? ????? ??? ?????.  ???? ??? ??????? ??? ???? ?????? ??????? ??? ??? ?????? ?? ????? ?? ????? ??????? ??? ???? ??? ????? ?? ?? ??????? ???? ??? ?? ???????.  ?????? ??? ?????? ??????? ?????. ?????? ??? ??????? ?? ???? ?????? ?? ???? ????? ?? ????? ???? ????? ?? ??????? (??????). ???  ????? ?? ??? ????????? ?? ???? ?????? ????????? ???? ?????? ???? ??????? ??????. ???? ?? ?????? ??? ????? ???? ?? ???? ?? ???? ??????? ??????.? Document Revised: 09/26/2019 Document Reviewed: 09/26/2019 Elsevier Patient Education  2023 [employer]vier Inc.  ?????? ?????? ?????? ??? ???????? Vaginal Yeast Infection, Adult  ?????? ?????? ?????? ????? ???? ???? ??????? ?????? ?? ?????? ?? ?????? ????? ??????? ?? (??????). ???? ???? ?????. ?? ????? ????? ??? ???????? ???? ??????. ?? ????? ??? ??????? ???? ??? ?????? ?? ???? ???? ?? ??????? ??????? ??? ???????? (?????????) ?????????? ???????? ???? ???? ?? ??????. ?????? ??? ??????? ?? ??? ???? ????????? ??? ???? ??? ???? ??????. ?? ????? ????? ?????? ????? ???????? ??????? ???? ??? ?????? ?? ????:  ????? ???????? ???????.  ???? ??????.  ????? ???? ????? ?????.  ?????.  ??????? ?????? ??????? ??????.  ??? ?????? ??????? (?????? ???????).  ????? ??????? ??????????? ??? ??? ????.  ??????? ?? ?????? ??????? ??????. ?? ?????? ??? ?????? ?? ???????? ???? ????? ??? ?????? ?? ???:  ??????? ?????? ????? ????? ?? ???? ???????.  ???? ?????? ??????? ???? ??? ???????? ????????. ???? ??????? ?? ???? ?????? ?? ?????? (????? ?? ???????) ?????.  ?????? ???? ?? ????? ?? ????? ??????.  ??? ?? ????? ??????. ??? ????? ??? ??????? ????? ??? ?????? ???:  ??????? ?????.  ??? ????.  ??? ?????. ????? ????? ??????? ?????? ??????? ??? ???? ?? ????????? ???????? ??? ???????????. ??? ???? ??? ?????? ????? ??? ??????? ?????? ??????? ?? ???????. ??? ?????? ??? ??????? ?????? ??? ?????? ????????. ??? ???? ??? ????? ????? ??? ???? ???? ?? ????? ????. ?? ????? ??? ??????? ???? ?? ???? ?? ??????? ???????:  ???? ??????? ?? ???? ???? (??????).  ???? ?? ???? ???? (?????).  ???? ??????? ??????? ?? ?????? ??????? (?????? ??????). ????? ??? ????????? ?? ??????:  ?????? ?? ??????? ??????? ??? ??????? ???? ??????? ?????? ????? ???? ????? ????? ????? ???? ?? ??? ???? ????.  ??  ??????? ???????? ???????? ??? ?? ????? ????? ??????? ?????? ??? ???.  ?? ?????? ?????? ??? ???? ??????. ?????? ?? ?????? ?????? ?? ???? ??????? ?? ????? ?????? ?? ???? ?? ?????. ????? ????? ??????? ?????? ??????? ??? ??? ????? ?????? ??????? ?????? ?????? ?????.  ?????? ????? ?????? ????????. ???? ??? ???. ??? ????? ??????? ?? ????   ?? ????? ????? ????? ??? ??????? ??????? ?? ???????? ??????.  ?? ??? ?????? ????? ?????? ????? ???? ????? ?????? ???????.  ?? ??????? ???? ??????? ?? ??????? ?????? ?? ???????? ?? ????????.  ??? ???? ?????? ????? ????? ?? ?????? ?????.  ??? ???? ????? ???????? ???? ????? ??? ??????? ??? ??????? ??? ???? ????.  ????? ???? ??????? ?????? ?? ????? ?????? ??????? ?? ???? ????????. ????? ?????? ??????? ?????? ?? ??????? ???????:  ??????? ????.  ???? ??????? ?? ???? ??????.  ??? ???? ??????? ?? ??????.  ??? ?????? ??????? ??????? ???? ???????.  ???? ????? ????? ????.  ??????? ????? ?? ?????? ?? ????.  ???? ?? ?? ?????? ?? ??? ???? ?????.  ?????? ???? ?? ?????. ????  ?????? ?????? ?????? ????? ???? ???? ??????? ?? ?????? ?? ?????? ?? ?????? ????? ??????? ?? (??????).  ?????? ??? ?????? ????????. ??? ???? ??? ????? ????? ??? ???? ???? ?? ????? ????.  ?????? ?? ??????? ??????? ??? ??????? ???? ??????? ?????? ????? ???? ????? ????? ????? ???? ?? ??? ???? ????.  ?? ??????? ???? ???????. ????? ?????? ????? ?????? ?? ??????? ???????? ???????? (????????) ??? ??????? ????? ??????? ??????.  ????? ???????? ??????? ?????? ??? ?? ????? ??????? ???? ?????? ???? ??? ?? ????? ??????? ?? ??? ????? ??????? ?? ???? ??????. ??? ????? ?? ??? ????????? ?? ???? ?????? ????????? ???? ?????? ???? ??????? ??????. ???? ?? ?????? ??? ????? ???? ?? ???? ?? ???? ??????? ??????Aaron Aas?  Document Revised: 05/27/2020 Document Reviewed: 05/27/2020 Elsevier Patient Education  2024 [employer].

## 2023-07-28 NOTE — Progress Notes (Signed)
 With Video Interpreter # [phone] pt reports that she has been having vaginal irritation that is on the inside that usually occurs while having intercourse.  Pt advised to use lubrication as it may help relieve possible dryness.  Pt also advised that she can use hydrocortisone  cream or Monistat .   Pt explained how to obtain self swab and that we will call with abnormal results.  Pt request results of her progesterone  level.  I informed pt results and that at the time she would have been ovulating.  I verified pt's contact info as I informed pt that we had been trying to get in touch with the results and was not able to get in contact with her.  Pt offered referral to [facility] for infertility as we do not mange pt's for infertility  Pt declines referral.  I encouraged pt to utilize ovulation test kits and let us  know if she wants the referral.  Pt explained how to obtain the self swab and that we will contact her with abnormal.  Pt also provided with information to [facility], brand of ovulation test kit, and info on BV and yeast infections.   Pt verbalized understanding with no further questions.  Kaylan Yates, RN

## 2023-07-29 LAB — CERVICOVAGINAL ANCILLARY ONLY
Bacterial Vaginitis (gardnerella): NEGATIVE
Candida Glabrata: NEGATIVE
Candida Vaginitis: POSITIVE — AB
Chlamydia: NEGATIVE
Comment: NEGATIVE
Comment: NEGATIVE
Comment: NEGATIVE
Comment: NEGATIVE
Comment: NEGATIVE
Comment: NORMAL
Neisseria Gonorrhea: NEGATIVE
Trichomonas: NEGATIVE

## 2023-08-02 ENCOUNTER — Ambulatory Visit: Payer: Self-pay | Admitting: Family Medicine

## 2023-08-02 MED ORDER — FLUCONAZOLE 150 MG PO TABS: 150.0000 mg | ORAL_TABLET | Freq: Once | ORAL | 0 refills | Status: AC

## 2023-08-03 NOTE — Telephone Encounter (Addendum)
 Attempted to call patient with interpreter [phone] regarding patient's recent results. Left VM for patient to call our office back regarding non urgent results.   Moira Andrews, RN ----- Message from Teena Feast sent at 08/02/2023  8:34 AM EDT ----- Please let patient know (Arabic speaking) that swab was +yeast, rx sent to pharmacy on file for fluconazole 

## 2023-08-04 NOTE — Telephone Encounter (Signed)
 Called patient with [employer], # [phone].   Patient did not answer. LM on voicemail that a prescription has been called into her Pharmacy and to call the office if she has any questions or concerns. Will send MyChart message.

## 2023-10-20 ENCOUNTER — Inpatient Hospital Stay (HOSPITAL_COMMUNITY)
Admission: AD | Admit: 2023-10-20 | Discharge: 2023-10-21 | Disposition: A | Discharge status: Home / Self Care | Attending: Obstetrics & Gynecology | Admitting: Obstetrics & Gynecology

## 2023-10-20 ENCOUNTER — Other Ambulatory Visit: Payer: Self-pay

## 2023-10-20 DIAGNOSIS — N939 Abnormal uterine and vaginal bleeding, unspecified: Secondary | ICD-10-CM | POA: Diagnosis not present

## 2023-10-20 DIAGNOSIS — Z3202 Encounter for pregnancy test, result negative: Secondary | ICD-10-CM | POA: Diagnosis not present

## 2023-10-20 LAB — URINALYSIS, ROUTINE W REFLEX MICROSCOPIC
Bacteria, UA: NONE SEEN
Bilirubin Urine: NEGATIVE
Glucose, UA: NEGATIVE mg/dL
Ketones, ur: NEGATIVE mg/dL
Nitrite: NEGATIVE
Protein, ur: NEGATIVE mg/dL
RBC / HPF: >50 RBC/hpf (ref 0–5)
Specific Gravity, Urine: 1.018 (ref 1.005–1.030)
pH: 7 (ref 5.0–8.0)

## 2023-10-20 LAB — POCT PREGNANCY, URINE: Preg Test, Ur: NEGATIVE

## 2023-10-20 LAB — HCG, QUANTITATIVE, PREGNANCY: hCG, Beta Chain, Quant, S: <1 m[IU]/mL (ref <5)

## 2023-10-20 NOTE — Discharge Instructions (Signed)

## 2023-10-20 NOTE — MAU Note (Signed)
 Kelsey Ramirez is a 38 y.o. at Unknown here in MAU reporting: was having regular periods - period is 12 days late. Believes she is pregnant, but started having VB with small clots, dizziness, lower abdominal cramping, and back pain yesterday. Took home pregnancy test and had faint positive line.   LMP: 09/10/2023 Onset of complaint: yesterday  Pain score: 5 Vitals:   10/20/23 2115  BP: 103/67  Pulse: 80  Resp: 18  Temp: 98.7 F (37.1 C)  SpO2: 100%     FHT: NA  Lab orders placed from triage: urine pregnancy

## 2023-10-20 NOTE — MAU Provider Note (Addendum)
 None     [name] is a 38 y.o. [phone] female at Unknown who presents to MAU today with complaint of vaginal bleeding.  She reports vaginal bleeding with small clots and lower abdominal cramping/back pain started yesterday, about 12 days after expected start of her menses.  She also reports dizziness.  She took a pregnancy test at home and had a faint positive.   Pertinent items noted in HPI and remainder of comprehensive ROS otherwise negative.   O BP 103/67 (BP Location: Right Arm)   Pulse 80   Temp 98.7 F (37.1 C)   Resp 18   SpO2 100%  Physical Exam Vitals reviewed.  Constitutional:      Appearance: Normal appearance.  HENT:     Head: Normocephalic.  Cardiovascular:     Rate and Rhythm: Normal rate and regular rhythm.     Pulses: Normal pulses.     Heart sounds: Normal heart sounds.  Pulmonary:     Effort: Pulmonary effort is normal.     Breath sounds: Normal breath sounds.  Skin:    General: Skin is warm and dry.     Capillary Refill: Capillary refill takes less than 2 seconds.  Neurological:     General: No focal deficit present.     Mental Status: She is alert and oriented to person, place, and time.  Psychiatric:        Mood and Affect: Mood normal.        Behavior: Behavior normal.        Thought Content: Thought content normal.        Judgment: Judgment normal.     Results for orders placed or performed during the hospital encounter of 10/20/23 (from the past 24 hours)  Urinalysis, Routine w reflex microscopic -Urine, Clean Catch     Status: Abnormal   Collection Time: 10/20/23  9:17 PM  Result Value Ref Range   Color, Urine AMBER (A) YELLOW   APPearance CLOUDY (A) CLEAR   Specific Gravity, Urine 1.018 1.005 - 1.030   pH 7.0 5.0 - 8.0   Glucose, UA NEGATIVE NEGATIVE mg/dL   Hgb urine dipstick LARGE (A) NEGATIVE   Bilirubin Urine NEGATIVE NEGATIVE   Ketones, ur NEGATIVE NEGATIVE mg/dL   Protein, ur NEGATIVE NEGATIVE  mg/dL   Nitrite NEGATIVE NEGATIVE   Leukocytes,Ua TRACE (A) NEGATIVE   RBC / HPF >50 0 - 5 RBC/hpf   WBC, UA 6-10 0 - 5 WBC/hpf   Bacteria, UA NONE SEEN NONE SEEN   Squamous Epithelial / HPF 0-5 0 - 5 /HPF   Mucus PRESENT   Pregnancy, urine POC     Status: None   Collection Time: 10/20/23  9:34 PM  Result Value Ref Range   Preg Test, Ur NEGATIVE NEGATIVE  hCG, quantitative, pregnancy     Status: None   Collection Time: 10/20/23 10:22 PM  Result Value Ref Range   hCG, Beta Chain, Quant, S <1 <5 mIU/mL    MDM: Moderate Rule out pregnancy Negative quant   MAU Course: -Vital signs within normal limits. -Negative UPT, will check hCG. -If hCG negative, suspect vaginal bleeding is menses. -Dizziness may be due to low BP.  A 1. Vaginal bleeding (Primary) - Discharge patient  2. Abnormal uterine bleeding  3. Negative pregnancy test   Medical screening exam complete  P - Discharge home with routine precautions. - Safe medication list and GYN provider list provided. - Recommend good nutrition, electrolytes  as needed.   - Remote Arabic interpreter used for entire episode of care.    Camie DELENA Rote, CNM
